# Patient Record
Sex: Male | Born: 1955
Health system: Southern US, Community
[De-identification: ages and names within clinical notes are randomized; demographics above are authoritative.]

## PROBLEM LIST (undated history)

## (undated) DIAGNOSIS — I10 Essential (primary) hypertension: Secondary | ICD-10-CM

## (undated) DIAGNOSIS — B029 Zoster without complications: Secondary | ICD-10-CM

## (undated) DIAGNOSIS — E559 Vitamin D deficiency, unspecified: Secondary | ICD-10-CM

## (undated) DIAGNOSIS — R011 Cardiac murmur, unspecified: Secondary | ICD-10-CM

## (undated) DIAGNOSIS — M1990 Unspecified osteoarthritis, unspecified site: Secondary | ICD-10-CM

## (undated) DIAGNOSIS — E785 Hyperlipidemia, unspecified: Secondary | ICD-10-CM

## (undated) HISTORY — DX: Essential (primary) hypertension: I10

## (undated) HISTORY — DX: Hyperlipidemia, unspecified: E78.5

## (undated) HISTORY — DX: Vitamin D deficiency, unspecified: E55.9

## (undated) HISTORY — PX: JOINT REPLACEMENT: SHX530

## (undated) HISTORY — DX: Zoster without complications: B02.9

## (undated) HISTORY — DX: Unspecified osteoarthritis, unspecified site: M19.90

## (undated) HISTORY — PX: ARTHROSCOPY WITH ANTERIOR CRUCIATE LIGAMENT (ACL) REPAIR WITH ANTERIOR TIBILIAS GRAFT: SHX6503

## (undated) HISTORY — PX: HERNIA REPAIR: SHX51

## (undated) HISTORY — PX: SHOULDER ARTHROSCOPY: SHX128

---

## 2006-06-09 ENCOUNTER — Ambulatory Visit: Payer: Self-pay | Admitting: Cardiology

## 2006-07-08 ENCOUNTER — Ambulatory Visit: Payer: Self-pay

## 2007-04-20 ENCOUNTER — Encounter: Admission: RE | Admit: 2007-04-20 | Discharge: 2007-04-20 | Payer: Self-pay | Admitting: Orthopedic Surgery

## 2011-02-21 ENCOUNTER — Other Ambulatory Visit: Payer: Self-pay | Admitting: Internal Medicine

## 2011-02-21 ENCOUNTER — Ambulatory Visit
Admission: RE | Admit: 2011-02-21 | Discharge: 2011-02-21 | Disposition: A | Discharge status: Home / Self Care | Payer: Self-pay | Source: Ambulatory Visit | Attending: Internal Medicine | Admitting: Internal Medicine

## 2011-02-21 MED ORDER — IOHEXOL 300 MG/ML  SOLN
100.0000 mL | Freq: Once | INTRAMUSCULAR | Status: AC | PRN
  Administered 2011-02-21: 100 mL via INTRAVENOUS

## 2011-03-01 NOTE — Assessment & Plan Note (Signed)
[facility]                              CARDIOLOGY OFFICE NOTE   NAME:Cheyney, HATIM HOMANN                   MRN:          [id number]  DATE:06/09/2006                            DOB:          09-22-1956    REASON FOR REFERRAL:  Dizziness and cardiovascular risk factors.   HISTORY OF PRESENT ILLNESS:  The patient is a very pleasant 55 year old  gentleman who has no past history of heart disease.  However, he has a  strong family history.  He was also complaining of dizziness.  This was  happening a couple of weeks ago.  He noticed that rather acutely it would  happen when he stood up.  He was not having any palpitations and denied any  presyncope or syncope.  He felt like things were spinning.  He was not  describing classic orthostatic symptoms.  By the time he saw Dr. Elisabeth Most  for routine followup, he was improving.  He now says he has had almost  complete resolution of this.   He has recently tried to lose weight and has been successful to the tune of  17 pounds.  He has been exercising three times a week.  With this, he denies  any chest discomfort, neck discomfort, arm discomfort, activity-induced  nausea, vomiting, excessive diaphoresis.   PAST MEDICAL HISTORY:  Hypertension x2 years, dyslipidemia (total 215,  triglycerides 143, HDL 46, LDL 140.  This is down from 177.)   PAST SURGICAL HISTORY:  None.   ALLERGIES:  NONE.   MEDICATIONS:  Diovan 80 mg daily.   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  He  has three children and two grandsons.  He has never smoked cigarettes and  does not drink alcohol.   FAMILY HISTORY:  Contributory for his father having heart disease in his  early 19s and dying of heart failure at age 8.   REVIEW OF SYSTEMS:  As stated in the HPI and otherwise negative for all  other systems.   PHYSICAL EXAMINATION:  GENERAL:  The patient is in no distress.  VITAL SIGNS:  Blood  pressure 113/74 without orthostatic blood pressure drop,  heart rate 84 and regular.  HEENT:  Eyes unremarkable, pupils equal, round and react to light, fundi  within normal limits, oral mucosa unremarkable.  NECK:  No jugular venous distention.  Waveform within normal limits.  Carotid upstroke brisk and symmetric, no bruits, no thyromegaly.  LYMPHATICS:  No cervical, axillary, inguinal adenopathy.  LUNGS:  Clear to auscultation bilaterally.  BACK:  No costovertebral angle tenderness.  CHEST:  Unremarkable.  HEART:  PMI not displaced or sustained, S1 and S2 within normal limits, no  S3, no S4, no murmurs.  ABDOMEN:  Flat, positive bowel sounds, normal in frequency and pitch, no  bruits, rebound, guarding, no midline,  pulsatile masses, no hepatomegaly,  splenomegaly.  SKIN:  No rashes, no nodules.  EXTREMITIES:  2+ pulses throughout, no edema, no cyanosis, no clubbing.  NEURO:  Oriented to person, place and time.  Cranial nerves II-XII grossly  intact, motor grossly intact.  EKG:  Sinus rhythm, rate 67, axis within normal limits, intervals within  normal limits, early repolarization pattern, rSR prime V1 and V2, no acute  ST-T wave changes.   ASSESSMENT/PLAN:  1. Dizziness.  Patient's dizziness is resolving.  He has no orthostatic      symptoms.  There is no significant history with dysrhythmia.  No      further cardiovascular testing is suggested.  2. Dyslipidemia.  I had a long conversation with the patient.  He has a      very strong family history.  He has done a great job getting his LDL      down to 140.  His starting was 177.  Given this previous lipid profile      and his strong family history, I have given him the option of starting      a statin.  I have told him that this is out of the current guidelines      and an aggressive approach.  He understands the side effects associated      with statins.  He wishes to proceed with a statin, and I have given him       simvastatin 20 mg daily.  He has gotten written instructions to get a      lipid profile and liver enzymes done either here or at Dr. Hardie Pulley      office in six weeks.  3. Followup.  Will bring him back for an exercise treadmill test.  He      wants to embark on a more rigorous exercise regimen.  He does have risk      factors.  I am going to screen him for obstructive coronary disease,      but more importantly, at the time of that test, he can get a      prescription for exercise as well as risk stratification.  4. Followup will be at the time of his exercise treadmill test.                               Rollene Rotunda, MD, [facility]    JH/MedQ  DD:  06/09/2006  DT:  06/10/2006  Job #:  [id number]

## 2011-03-01 NOTE — Procedures (Signed)
[name]                                EXERCISE TREADMILL   NAME:[name] [name] NACLERIO                   MRN:          [id number]  DATE:07/08/2006                            DOB:          1956/06/15    INDICATIONS:  The patient with multiple cardiovascular risk factors and  chest pain.   PROCEDURE NOTE:  The patient was exercised using standard Bruce protocol.  He was able to exercise x10 minutes.  This was 1 minute in stage IV.  He  achieved 11.7 METS.  He had a peak heart rate of 176 which is 104% of  predicted.  He had an appropriate blood pressure response with a maximum of  168/66.  He had no chest pain.  There were no ischemia ST and T wave  changes.  There was no ectopy.  He had a normal heart rate recovery.   CONCLUSION:  Negative adequate exercise treadmill test.  He has a very  reasonable exercise tolerance.  All parameters put him in a low risk  category for future cardiovascular events.   PLAN:  Based on this, the patient will pursue continued risk reduction.  I  gave him a prescription for exercise after a lengthy discussion.  He can  follow as needed.   Of note, I did start him on simvastatin at the initial appointment because  of his significant family history.  He says he did not feel well initially  taking this and stopped it himself.  I asked him to restart it to see if he  gets the same kind of symptoms as they were somewhat atypical.  If he does  not tolerate the simvastatin, I have given prescription for Pravachol 20 mg  p.o. nightly.  After he has been on a stable dose for Statin x6 weeks, he  has been given written instructions to get a lipid profile and liver  enzymes.  He can have this followed by Dr. Elisabeth [name].            ______________________________  [name] Rotunda, MD, [facility]      JH/MedQ  DD:  07/09/2006  DT:  07/10/2006  Job #:  [id number]   cc:   [name] [name], D.O.

## 2011-06-08 IMAGING — CT CT ABDOMEN W/ CM
2 of 5 series · 17 of 46 positions shown, 19 images · IV contrast (30CC OMNI 300 & [ID] OMNI 300)
Comparison: None.

CLINICAL DATA: Left upper quadrant abdominal pain for 2 weeks

CT ABDOMEN WITH CONTRAST
TECHNIQUE: Multidetector CT imaging of the abdomen was performed
using the standard protocol following bolus administration of
intravenous contrast.
Contrast: 100 ml Omnipaque-O66

[Series 2: abdomen w/ · axial · 0.70mm/px · z∈[-272,+8]mm · 14 of 64 slices shown, 16 images]
[im 4/64  soft-tissue]
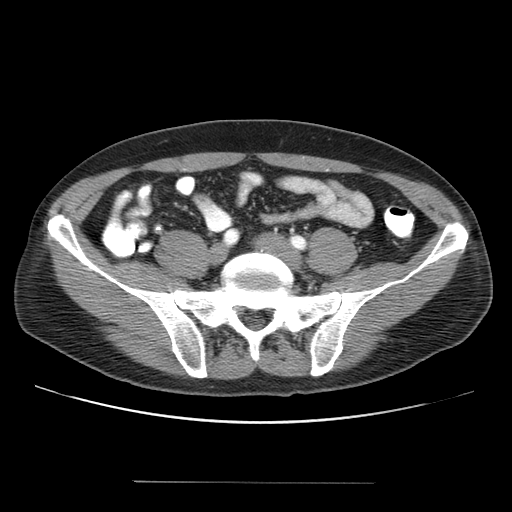
[im 4/64  bone]
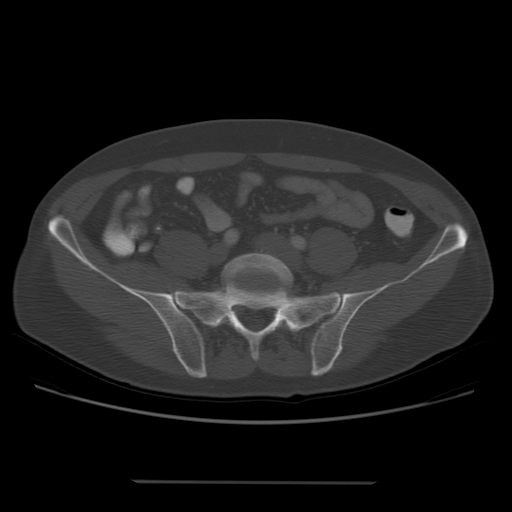
[im 8/64  soft-tissue]
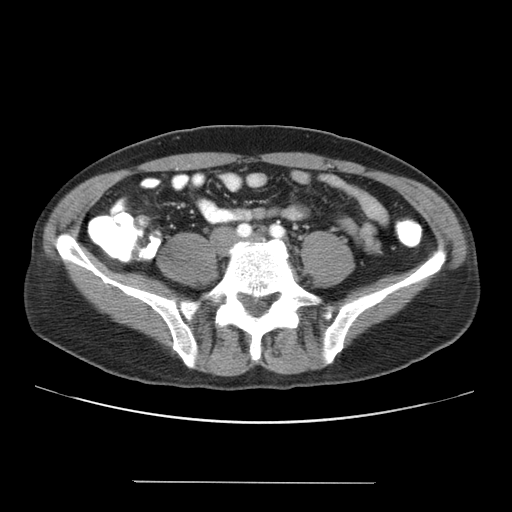
[im 12/64  soft-tissue]
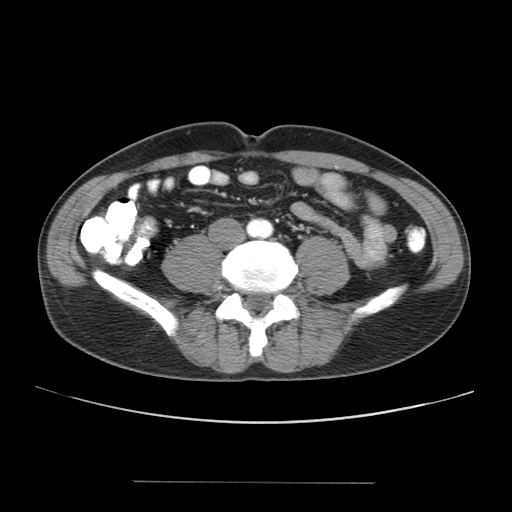
[im 16/64  soft-tissue]
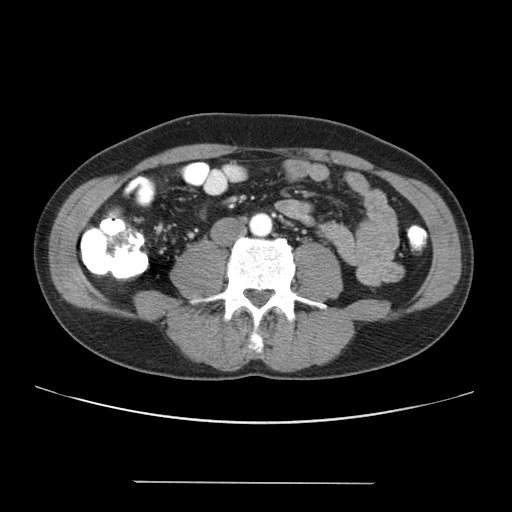
[im 20/64  soft-tissue]
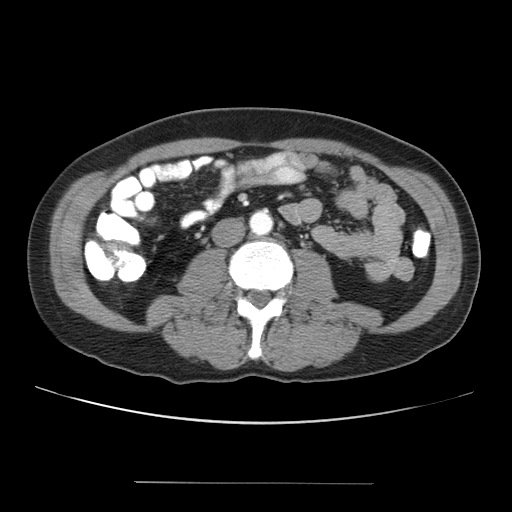
[im 24/64  soft-tissue]
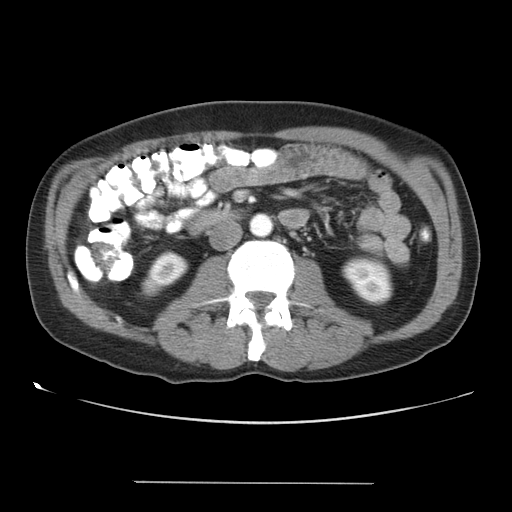
[im 28/64  soft-tissue]
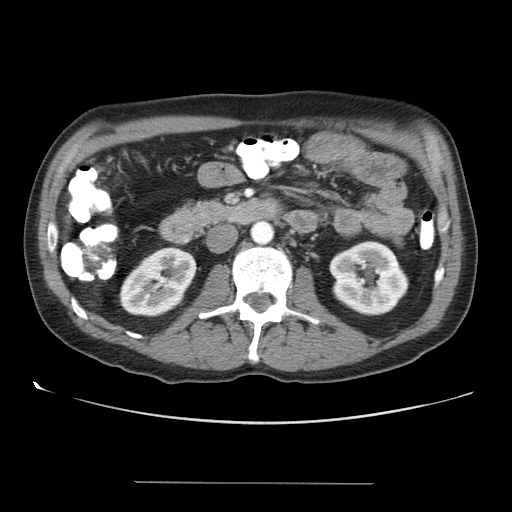
[im 36/64  soft-tissue]
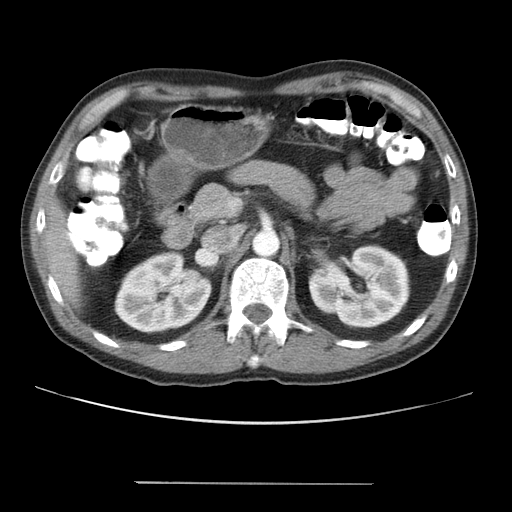
[im 40/64  soft-tissue]
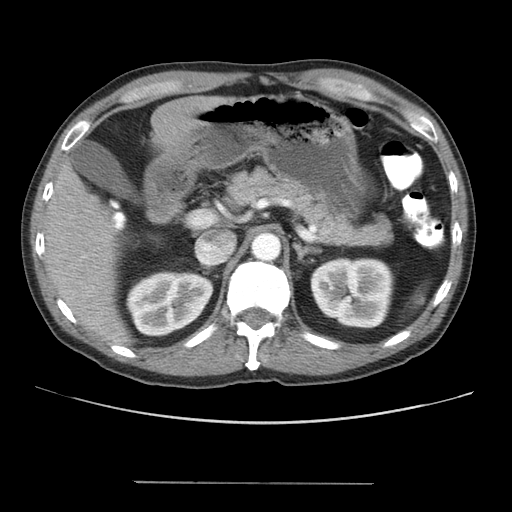
[im 40/64  bone]
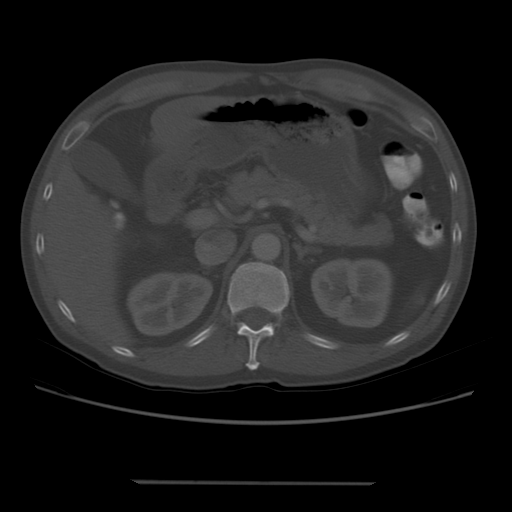
[im 44/64  soft-tissue]
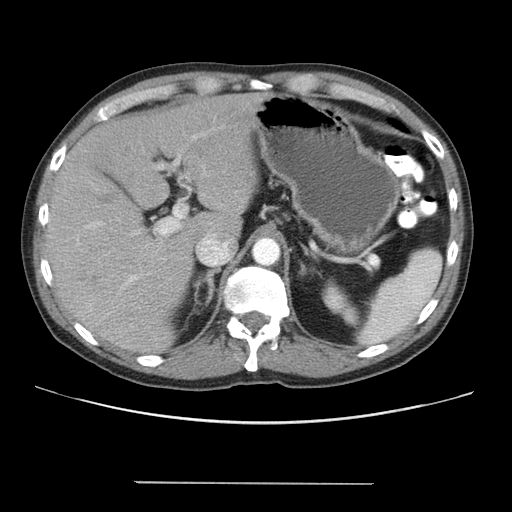
[im 48/64  soft-tissue]
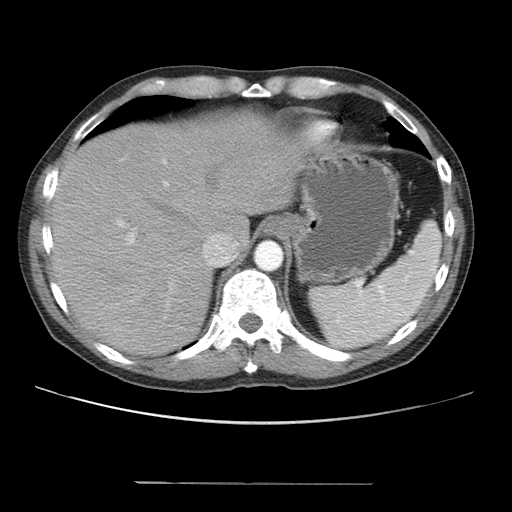
[im 52/64  soft-tissue]
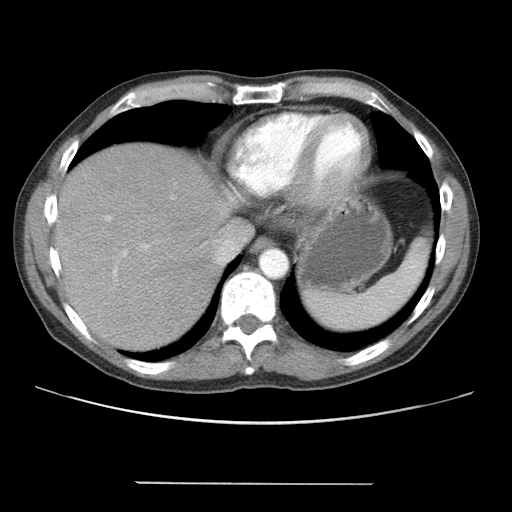
[im 56/64  soft-tissue]
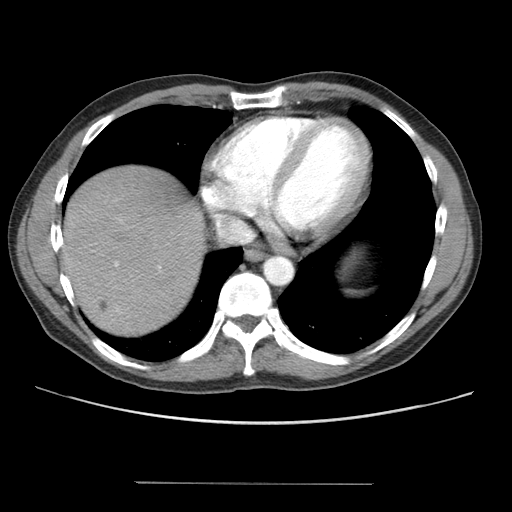
[im 60/64  soft-tissue]
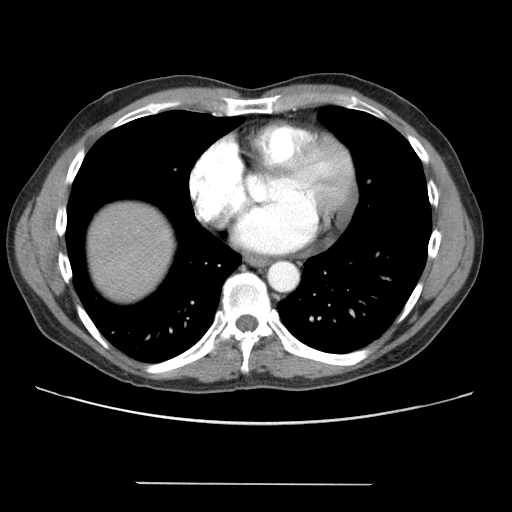

[Series 400: cor · coronal · 0.70mm/px · 3 of 97 slices shown]
[im 33/97  soft-tissue]
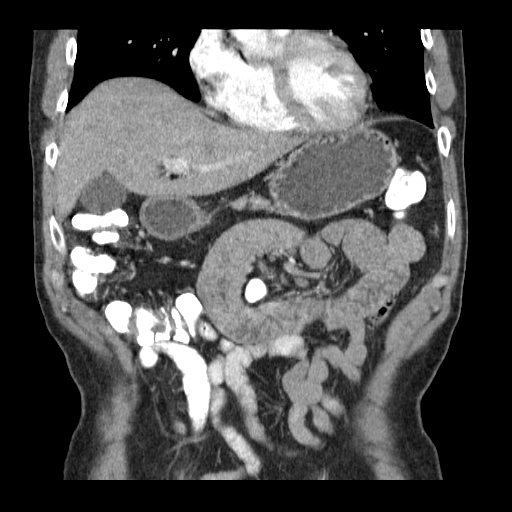
[im 43/97  soft-tissue]
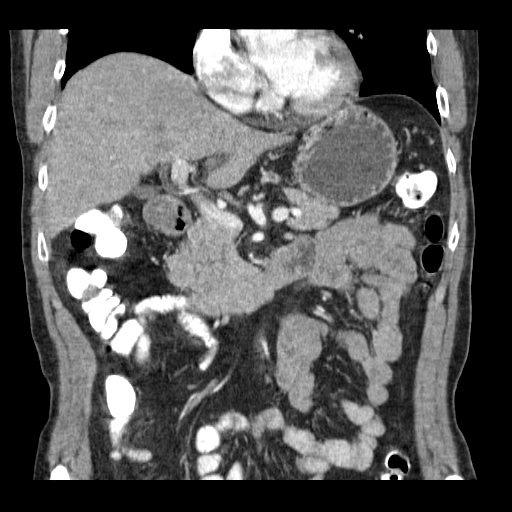
[im 54/97  soft-tissue]
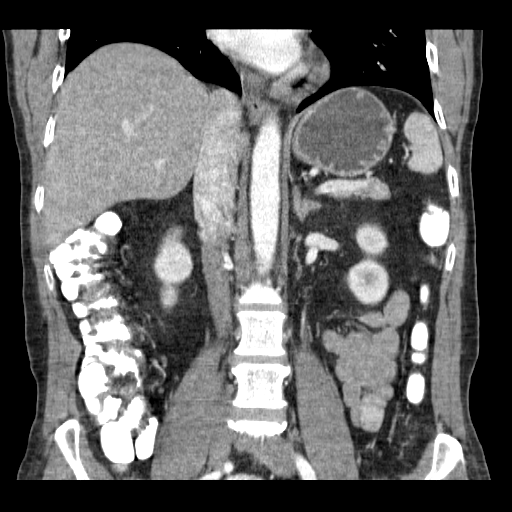

[17 of 46 positions shown; findings below may reference images not displayed]

FINDINGS: The lung bases are clear.  The heart is mildly enlarged.
Only a single low attenuation structures noted in the dome of the
right lobe of liver posteriorly most consistent with benign process
such as cyst.  No ductal dilatation is seen.  No calcified
gallstones are noted.  The pancreas is normal in size and the
pancreatic duct is not dilated.  The adrenal glands and spleen are
unremarkable.  The stomach is moderately fluid distended with no
abnormality noted.  The kidneys enhance with no calculus or mass
and no hydronephrosis is seen.  The abdominal aorta is normal in
caliber. The portion of the colon that is visualized on this CT
abdomen is unremarkable.  The terminal ileum is normal, as is the
proximal portion of the appendix that is visualized.  No bony
abnormality is seen.
IMPRESSION: Negative CT of the abdomen.  No explanation for the patient's left
upper quadrant pain is seen.

## 2013-12-06 ENCOUNTER — Other Ambulatory Visit: Payer: Self-pay | Admitting: Physician Assistant

## 2013-12-06 MED ORDER — SIMVASTATIN 40 MG PO TABS: 40.0000 mg | ORAL_TABLET | Freq: Every evening | ORAL | Status: DC

## 2013-12-06 MED ORDER — VALSARTAN 80 MG PO TABS: 80.0000 mg | ORAL_TABLET | Freq: Every day | ORAL | Status: DC

## 2013-12-13 NOTE — Progress Notes (Signed)
LMOM TO CALL & SCHEDULE  

## 2014-01-20 ENCOUNTER — Other Ambulatory Visit: Payer: Self-pay | Admitting: Emergency Medicine

## 2014-01-21 ENCOUNTER — Encounter: Payer: Self-pay | Admitting: Emergency Medicine

## 2014-01-21 ENCOUNTER — Ambulatory Visit (INDEPENDENT_AMBULATORY_CARE_PROVIDER_SITE_OTHER): Payer: BC Managed Care – PPO | Admitting: Emergency Medicine

## 2014-01-21 VITALS — BP 122/84 | HR 78 | Temp 98.2°F | Resp 16 | Ht 68.5 in | Wt 168.0 lb

## 2014-01-21 DIAGNOSIS — R079 Chest pain, unspecified: Secondary | ICD-10-CM

## 2014-01-21 DIAGNOSIS — E785 Hyperlipidemia, unspecified: Secondary | ICD-10-CM

## 2014-01-21 DIAGNOSIS — I10 Essential (primary) hypertension: Secondary | ICD-10-CM

## 2014-01-21 DIAGNOSIS — E782 Mixed hyperlipidemia: Secondary | ICD-10-CM | POA: Insufficient documentation

## 2014-01-21 DIAGNOSIS — R7309 Other abnormal glucose: Secondary | ICD-10-CM

## 2014-01-21 LAB — CBC WITH DIFFERENTIAL/PLATELET
BASOS ABS: 0.1 10*3/uL (ref 0.0–0.1)
BASOS PCT: 1 % (ref 0–1)
EOS ABS: 0.1 10*3/uL (ref 0.0–0.7)
Eosinophils Relative: 2 % (ref 0–5)
HCT: 37.5 % — ABNORMAL LOW (ref 39.0–52.0)
HEMOGLOBIN: 12.8 g/dL — AB (ref 13.0–17.0)
Lymphocytes Relative: 27 % (ref 12–46)
Lymphs Abs: 1.7 10*3/uL (ref 0.7–4.0)
MCH: 29.4 pg (ref 26.0–34.0)
MCHC: 34.1 g/dL (ref 30.0–36.0)
MCV: 86.2 fL (ref 78.0–100.0)
MONOS PCT: 9 % (ref 3–12)
Monocytes Absolute: 0.6 10*3/uL (ref 0.1–1.0)
NEUTROS ABS: 3.8 10*3/uL (ref 1.7–7.7)
NEUTROS PCT: 61 % (ref 43–77)
PLATELETS: 247 10*3/uL (ref 150–400)
RBC: 4.35 MIL/uL (ref 4.22–5.81)
RDW: 14.3 % (ref 11.5–15.5)
WBC: 6.2 10*3/uL (ref 4.0–10.5)

## 2014-01-21 LAB — HEPATIC FUNCTION PANEL
ALK PHOS: 52 U/L (ref 39–117)
ALT: 14 U/L (ref 0–53)
AST: 14 U/L (ref 0–37)
Albumin: 4.1 g/dL (ref 3.5–5.2)
BILIRUBIN DIRECT: 0.1 mg/dL (ref 0.0–0.3)
BILIRUBIN INDIRECT: 0.4 mg/dL (ref 0.2–1.2)
TOTAL PROTEIN: 6.5 g/dL (ref 6.0–8.3)
Total Bilirubin: 0.5 mg/dL (ref 0.2–1.2)

## 2014-01-21 LAB — LIPID PANEL
Cholesterol: 160 mg/dL (ref 0–200)
HDL: 45 mg/dL (ref >39)
LDL CALC: 98 mg/dL (ref 0–99)
Total CHOL/HDL Ratio: 3.6 Ratio
Triglycerides: 86 mg/dL (ref <150)
VLDL: 17 mg/dL (ref 0–40)

## 2014-01-21 LAB — BASIC METABOLIC PANEL WITH GFR
BUN: 15 mg/dL (ref 6–23)
CALCIUM: 9.2 mg/dL (ref 8.4–10.5)
CHLORIDE: 102 meq/L (ref 96–112)
CO2: 28 meq/L (ref 19–32)
Creat: 0.83 mg/dL (ref 0.50–1.35)
GFR, Est African American: >89 mL/min
Glucose, Bld: 95 mg/dL (ref 70–99)
Potassium: 4.4 mEq/L (ref 3.5–5.3)
SODIUM: 139 meq/L (ref 135–145)

## 2014-01-21 LAB — HEMOGLOBIN A1C
HEMOGLOBIN A1C: 6 % — AB (ref <5.7)
MEAN PLASMA GLUCOSE: 126 mg/dL — AB (ref <117)

## 2014-01-21 MED ORDER — VALSARTAN 80 MG PO TABS: ORAL_TABLET | ORAL | Status: DC

## 2014-01-21 MED ORDER — SIMVASTATIN 40 MG PO TABS: ORAL_TABLET | ORAL | Status: DC

## 2014-01-21 NOTE — Progress Notes (Signed)
   Subjective:    Patient ID: [name] [name], male    DOB: 09/28/1956, 58 y.o.   MRN: [id number]  HPI Comments: 58 yo WM presents for 3 month F/U for HTN, Cholesterol, Pre-Dm, D. Deficient. He did not f/u AD. He has been eating healthy for the most part. He is keeping active. He is not checking BP. He has been off RX x 2 days due to being overdue for labs. He had mild chest discomfort for a couple days last week. Father had 1st MI at 1550 and died at 3161. He had echo 2007 negative per Dr Antoine PocheHochrein.  LAST LABS  BS 105 T 151 TG 60 H 44 L 95 A1C 5.8 D 61  Hyperlipidemia Associated symptoms include chest pain.  Hypertension Associated symptoms include chest pain.      Medication List       This list is accurate as of: 01/21/14  8:59 AM.  Always use your most recent med list.               simvastatin 40 MG tablet  Commonly known as:  ZOCOR  TAKE 1 TABLET EVERY DAY     valsartan 80 MG tablet  Commonly known as:  DIOVAN  TAKE 1 TABLET EVERY DAY       No Known Allergies  Past Medical History  Diagnosis Date  . Hyperlipidemia   . Hypertension      Review of Systems  Respiratory: Positive for chest tightness.   Cardiovascular: Positive for chest pain.  All other systems reviewed and are negative.  BP 122/84  Pulse 78  Temp(Src) 98.2 F (36.8 C) (Temporal)  Resp 16  Ht 5' 8.5" (1.74 m)  Wt 168 lb (76.204 kg)  BMI 25.17 kg/m2      Objective:   Physical Exam  Nursing note and vitals reviewed. Constitutional: He is oriented to person, place, and time. He appears well-developed and well-nourished.  HENT:  Head: Normocephalic and atraumatic.  Right Ear: External ear normal.  Left Ear: External ear normal.  Nose: Nose normal.  Eyes: Conjunctivae and EOM are normal.  Neck: Normal range of motion. Neck supple. No JVD present. No thyromegaly present.  Cardiovascular: Normal rate, regular rhythm, normal heart sounds and intact distal pulses.   Pulmonary/Chest: Effort  normal and breath sounds normal.  Abdominal: Soft. Bowel sounds are normal. He exhibits no distension and no mass. There is no tenderness. There is no rebound and no guarding.  Musculoskeletal: Normal range of motion. He exhibits no edema and no tenderness.  Lymphadenopathy:    He has no cervical adenopathy.  Neurological: He is alert and oriented to person, place, and time. He has normal reflexes. No cranial nerve deficit. Coordination normal.  Skin: Skin is warm and dry.  Psychiatric: He has a normal mood and affect. His behavior is normal. Judgment and thought content normal.      EKG NSCSPT WNL     Assessment & Plan:  1.  3 month F/U for HTN, Cholesterol, Pre-Dm, D. Deficient. Needs healthy diet, cardio QD and obtain healthy weight. Check Labs, Check BP if >130/80 call office  2. Chest discomfort with FHx heart disease- Add ASA 81 mg. Ref Dr Antoine PocheHochrein for evaluation. ER if symptoms reoccur

## 2014-01-21 NOTE — Patient Instructions (Addendum)
Cholesterol Cholesterol is a type of fat. Your body needs a small amount of cholesterol, but too much can cause health problems. Certain problems include heart attacks, strokes, and not enough blood flow to your heart, brain, kidneys, or feet. You get cholesterol in 2 ways:  Naturally.  By eating certain foods. HOME CARE  Eat a low-fat diet:  Eat less eggs, whole dairy products (whole milk, cheese, and butter), fatty meats, and fried foods.  Eat more fruits, vegetables, whole-wheat breads, lean chicken, and fish.  Follow your exercise program as told by your doctor.  Keep your weight at a healthy level. Talk to your doctor about what is right for you.  Only take medicine as told by your doctor.  Get your cholesterol checked once a year or as told by your doctor. MAKE SURE YOU:  Understand these instructions.  Will watch your condition.  Will get help right away if you are not doing well or get worse. Document Released: 12/27/2008 Document Revised: 12/23/2011 Document Reviewed: 07/14/2013 [facility] Patient Information 2014 [place], [place]. Hypertension GOOD BP is less than 130/85  Hypertension is another name for high blood pressure. High blood pressure may mean that your heart needs to work harder to pump blood. Blood pressure consists of two numbers, which includes a higher number over a lower number (example: 110/72). HOME CARE   Make lifestyle changes as told by your doctor. This may include weight loss and exercise.  Take your blood pressure medicine every day.  Limit how much salt you use.  Stop smoking if you smoke.  Do not use drugs.  Talk to your doctor if you are using decongestants or birth control pills. These medicines might make blood pressure higher.  Females should not drink more than 1 alcoholic drink per day. Males should not drink more than 2 alcoholic drinks per day.  See your doctor as told. GET HELP RIGHT AWAY IF:   You have a blood pressure  reading with a top number of 180 or higher.  You get a very bad headache.  You get blurred or changing vision.  You feel confused.  You feel weak, numb, or faint.  You get chest or belly (abdominal) pain.  You throw up (vomit).  You cannot breathe very well. MAKE SURE YOU:   Understand these instructions.  Will watch your condition.  Will get help right away if you are not doing well or get worse. Document Released: 03/18/2008 Document Revised: 12/23/2011 Document Reviewed: 03/18/2008 [facility] Patient Information 2014 [place], [place].   Bad carbs also include fruit juice, alcohol, and sweet tea. These are empty calories that do not signal to your brain that you are full.   Please remember the good carbs are still carbs which convert into sugar. So please measure them out no more than 1/2-1 cup of rice, oatmeal, pasta, and beans.  Veggies are however free foods! Pile them on.   I like lean protein at every meal such as chicken, [place], pork chops, cottage cheese, etc. Just do not fry these meats and please center your meal around vegetable, the meats should be a side dish.   No all fruit is created equal. Please see the list below, the fruit at the bottom is higher in sugars than the fruit at the top   We want weight loss that will last so you should lose 1-2 pounds a week.  THAT IS IT! Please pick THREE things a month to change. Once it is a habit check  off the item. Then pick another three items off the list to become habits.  If you are already doing a habit on the list GREAT!  Cross that item off! o Don't drink your calories. Ie, alcohol, soda, fruit juice, and sweet tea.  o Drink more water. Drink a glass when you feel hungry or before each meal.  o Eat breakfast - Complex carb and protein (likeDannon light and fit yogurt, oatmeal, fruit, eggs, [place]key bacon). o Measure your cereal.  Eat no more than one cup a day. (ie [place]ashi) o Eat an apple a day. o Add a vegetable a  day. o Try a new vegetable a month. o Use Pam! Stop using oil or butter to cook. o Don't finish your plate or use smaller plates. o Share your dessert. o Eat sugar free Jello for dessert or frozen grapes. o Don't eat 2-3 hours before bed. o Switch to whole wheat bread, pasta, and brown rice. o Make healthier choices when you eat out. No fries! o Pick baked chicken, NOT fried. o Don't forget to SLOW DOWN when you eat. It is not going anywhere.  o Take the stairs. o Park far away in the parking lot o [employer] Farm[employer] soup cans (or weights) for 10 minutes while watching TV. o Walk at work for 10 minutes during break. o Walk outside 1 time a week with your friend, kids, dog, or significant other. o Start a walking group at church. o Walk the mall as much as you can tolerate.  o Keep a food diary. o Weigh yourself daily. o Walk for 15 minutes 3 days per week. o Cook at home more often and eat out less.  If life happens and you go back to old habits, it is okay.  Just start over. You can do it!   If you experience chest pain, get short of breath, or tired during the exercise, please stop immediately and inform your doctor.  Chest Pain (Nonspecific) Chest pain has many causes. Your pain could be caused by something serious, such as a heart attack or a blood clot in the lungs. It could also be caused by something less serious, such as a chest bruise or a virus. Follow up with your doctor. More lab tests or other studies may be needed to find the cause of your pain. Most of the time, nonspecific chest pain will improve within 2 to 3 days of rest and mild pain medicine. HOME CARE  For chest bruises, you may put ice on the sore area for 15-20 minutes, 03-04 times a day. Do this only if it makes you feel better.  Put ice in a plastic bag.  Place a towel between the skin and the bag.  Rest for the next 2 to 3 days.  Go back to work if the pain improves.  See your doctor if the pain lasts longer than 1  to 2 weeks.  Only take medicine as told by your doctor.  Quit smoking if you smoke. GET HELP RIGHT AWAY IF:   There is more pain or pain that spreads to the arm, neck, jaw, back, or belly (abdomen).  You have shortness of breath.  You cough more than usual or cough up blood.  You have very bad back or belly pain, feel sick to your stomach (nauseous), or throw up (vomit).  You have very bad weakness.  You pass out (faint).  You have a fever. Any of these problems may be serious and may  be an emergency. Do not wait to see if the problems will go away. Get medical help right away. Call your local emergency services 911 in [place] Do not drive yourself to the hospital. MAKE SURE YOU:   Understand these instructions.  Will watch this condition.  Will get help right away if you or your child is not doing well or gets worse. Document Released: 03/18/2008 Document Revised: 12/23/2011 Document Reviewed: 03/18/2008 [facility] Patient Information 2014 [place], [place].

## 2014-01-25 NOTE — Addendum Note (Signed)
Addended by: Loree FeeSMITH, Andrian Urbach R on: 01/25/2014 11:21 AM   Modules accepted: Orders

## 2014-02-18 ENCOUNTER — Encounter: Payer: Self-pay | Admitting: Cardiology

## 2014-02-18 ENCOUNTER — Ambulatory Visit (INDEPENDENT_AMBULATORY_CARE_PROVIDER_SITE_OTHER): Payer: BC Managed Care – PPO | Admitting: Cardiology

## 2014-02-18 VITALS — BP 120/70 | HR 87 | Ht 68.5 in | Wt 163.0 lb

## 2014-02-18 DIAGNOSIS — Z8249 Family history of ischemic heart disease and other diseases of the circulatory system: Secondary | ICD-10-CM

## 2014-02-18 DIAGNOSIS — E7800 Pure hypercholesterolemia, unspecified: Secondary | ICD-10-CM

## 2014-02-18 DIAGNOSIS — I10 Essential (primary) hypertension: Secondary | ICD-10-CM | POA: Insufficient documentation

## 2014-02-18 DIAGNOSIS — R011 Cardiac murmur, unspecified: Secondary | ICD-10-CM

## 2014-02-18 DIAGNOSIS — R079 Chest pain, unspecified: Secondary | ICD-10-CM

## 2014-02-18 NOTE — Progress Notes (Signed)
      [address] [place], [place]  [address] Phone: [phone] Fax:  [phone]  Date:  02/18/2014   ID:  [name] RossettiDonald W [name], DOB 10/06/1956, MRN [id number]  PCP:  Barnet GlasgowSmith, Melissa M, MD   History of Present Illness: [name] [name] is a 58 y.o. male here for the evaluation of chest discomfort. Has a strong family history with his father having first myocardial infarction at age 60 and dying at age 60. In the past, echocardiogram and stress test in 2007 were performed previously.  His LDL is 95, hemoglobin A1c 5.8.  Tightness off and on, center of chest, no radiation, doesn't feel quite right. Non exertional. No change with food. No stroke, no bleeding, no orthopnea.    Wt Readings from Last 3 Encounters:  02/18/14 163 lb (73.936 kg)  01/21/14 168 lb (76.204 kg)     Past Medical History  Diagnosis Date  . Hyperlipidemia   . Hypertension     No past surgical history on file.  Current Outpatient Prescriptions  Medication Sig Dispense Refill  . simvastatin (ZOCOR) 40 MG tablet TAKE 1 TABLET EVERY DAY  90 tablet  0  . valsartan (DIOVAN) 80 MG tablet TAKE 1 TABLET EVERY DAY  90 tablet  0   No current facility-administered medications for this visit.    Allergies:   No Known Allergies  Social History:  The patient  reports that he has never smoked. He does not have any smokeless tobacco history on file. Work for [employer], travel  No family history on file.  ROS:  Please see the history of present illness.    All other systems reviewed and negative.   PHYSICAL EXAM: VS:  BP 120/70  Pulse 87  Ht 5' 8.5" (1.74 m)  Wt 163 lb (73.936 kg)  BMI 24.42 kg/m2 Well nourished, well developed, in no acute distress HEENT: normal, Potter/AT, EOMI Neck: no JVD, normal carotid upstroke, + bruit (radiation of murmur likely) Cardiac:  normal S1, S2; RRR; 2/6 S murmur RUSB Lungs:  clear to auscultation bilaterally, no wheezing, rhonchi or rales Abd: soft,  nontender, no hepatomegaly, no bruits Ext: no edema, 2+ distal pulses Skin: warm and dry GU: deferred Neuro: no focal abnormalities noted, AAO x 3  EKG:  01/21/14-sinus rhythm with subtle J-point elevation diffusely.  ASSESSMENT AND PLAN:  1. Atypical chest pain-does not appear to be exertional. We will go ahead and check an exercise treadmill test. Subtle J-point elevation noted on EKG. Likely normal variant. 2. Heart murmur-checking echocardiogram. 3. Hyperlipidemia-simvastatin. 4. Family history of CAD-father MI 850. Primary prevention. Discussed. Exercise. 5. Will follow up with tests, PRN otherwise.   Signed, Donato SchultzMark Luciel Brickman, MD [facility]  02/18/2014 1:35 PM

## 2014-02-18 NOTE — Patient Instructions (Addendum)
Your physician recommends that you continue on your current medications as directed. Please refer to the Current Medication list given to you today.  Your physician has requested that you have an exercise tolerance test. For further information please visit [email or web address]. Please also follow instruction sheet, as given.  Your physician has requested that you have an echocardiogram. Echocardiography is a painless test that uses sound waves to create images of your heart. It provides your doctor with information about the size and shape of your heart and how well your heart's chambers and valves are working. This procedure takes approximately one hour. There are no restrictions for this procedure.  Your physician recommends that you schedule a follow-up appointment in: as needed with Dr. Anne FuSkains

## 2014-04-01 ENCOUNTER — Encounter: Payer: BC Managed Care – PPO | Admitting: Physician Assistant

## 2014-04-01 ENCOUNTER — Other Ambulatory Visit (HOSPITAL_COMMUNITY): Payer: BC Managed Care – PPO

## 2014-04-14 ENCOUNTER — Ambulatory Visit (INDEPENDENT_AMBULATORY_CARE_PROVIDER_SITE_OTHER): Payer: BC Managed Care – PPO | Admitting: Emergency Medicine

## 2014-04-14 ENCOUNTER — Encounter: Payer: Self-pay | Admitting: Emergency Medicine

## 2014-04-14 VITALS — BP 124/68 | HR 88 | Temp 98.4°F | Resp 16 | Ht 68.5 in | Wt 167.0 lb

## 2014-04-14 DIAGNOSIS — Z125 Encounter for screening for malignant neoplasm of prostate: Secondary | ICD-10-CM

## 2014-04-14 DIAGNOSIS — I10 Essential (primary) hypertension: Secondary | ICD-10-CM

## 2014-04-14 DIAGNOSIS — Z0000 Encounter for general adult medical examination without abnormal findings: Secondary | ICD-10-CM

## 2014-04-14 NOTE — Progress Notes (Signed)
Subjective:    Patient ID: [name] [name], male    DOB: 08/28/1956, 58 y.o.   MRN: [id number]  HPI Comments: 58 yo WM CPE and F/U for HTN, Cholesterol, Pre-Dm, D. deficient He has appointment with Caridology later this month to evaluate CP. He notes he is overall doing well. He has not had any chest pain since last OV. He is staying active and trying to improve diet.  He notes BP is good at home. He has no other concerns.  WBC             6.2   01/21/2014 HGB            12.8   01/21/2014 HCT            37.5   01/21/2014 PLT             247   01/21/2014 GLUCOSE          95   01/21/2014 CHOL            160   01/21/2014 TRIG             86   01/21/2014 HDL              45   01/21/2014 LDLCALC          98   01/21/2014 ALT              14   01/21/2014 AST              14   01/21/2014 NA              139   01/21/2014 K               4.4   01/21/2014 CL              102   01/21/2014 CREATININE     0.83   01/21/2014 BUN              15   01/21/2014 CO2              28   01/21/2014 HGBA1C          6.0   01/21/2014   Hyperlipidemia  Hypertension     Medication List       This list is accurate as of: 04/14/14 11:59 PM.  Always use your most recent med list.               aspirin 325 MG tablet  Take 325 mg by mouth daily.     simvastatin 40 MG tablet  Commonly known as:  ZOCOR  TAKE 1 TABLET EVERY DAY     valsartan 80 MG tablet  Commonly known as:  DIOVAN  TAKE 1 TABLET EVERY DAY     Vitamin D3 5000 UNITS Tabs  Take 5,000 Units by mouth daily.      No Known Allergies Past Medical History  Diagnosis Date  . Hyperlipidemia   . Hypertension   . Shingles   . Vitamin D deficiency    Past Surgical History  Procedure Laterality Date  . Shoulder arthroscopy      History  Substance Use Topics  . Smoking status: Never Smoker   . Smokeless tobacco: Not on file  . Alcohol Use: No   Family History  Problem Relation Age of Onset  . Cancer Mother     colon, lung  . Heart disease  Father   .  Hyperlipidemia Father   . Heart attack Father     MAINTENANCE: Colonoscopy:12/2012 [id number]:2014 WNL Dentist: q 6 month  IMMUNIZATIONS: Tdap:2008 Pneumovax: Zostavax: Influenza: declines  Patient Care Team: Lucky CowboyWilliam McKeown, MD as PCP - General (Internal Medicine) Griffith CitronJeffrey R Medoff, MD as Consulting Physician (Gastroenterology) Antonietta Barcelonaharles David Miller, OD as Referring Physician (Optometry) Beatrice LecherScott T Weaver, PA-C as Physician Assistant (Physician Assistant) Nils FlackShelley Cathcart, MD as Referring Physician (Dermatology) [place], (Dentist)  Review of Systems  All other systems reviewed and are negative.  BP 124/68  Pulse 88  Temp(Src) 98.4 F (36.9 C) (Temporal)  Resp 16  Ht 5' 8.5" (1.74 m)  Wt 167 lb (75.751 kg)  BMI 25.02 kg/m2     Objective:   Physical Exam  Nursing note and vitals reviewed. Constitutional: He is oriented to person, place, and time. He appears well-developed and well-nourished.  HENT:  Head: Normocephalic and atraumatic.  Right Ear: External ear normal.  Left Ear: External ear normal.  Nose: Nose normal.  Eyes: Conjunctivae and EOM are normal. Pupils are equal, round, and reactive to light. Right eye exhibits no discharge. Left eye exhibits no discharge. No scleral icterus.  Neck: Normal range of motion. Neck supple. No JVD present. No tracheal deviation present. No thyromegaly present.  Cardiovascular: Normal rate, regular rhythm and intact distal pulses.   Murmur heard. 2/6 stable  Pulmonary/Chest: Effort normal and breath sounds normal.  Abdominal: Soft. Bowel sounds are normal. He exhibits no distension and no mass. There is no tenderness. There is no rebound and no guarding.  Genitourinary:  Patient declines  Musculoskeletal: Normal range of motion. He exhibits no edema and no tenderness.  Lymphadenopathy:    He has no cervical adenopathy.  Neurological: He is alert and oriented to person, place, and time. He has normal reflexes. No cranial  nerve deficit. He exhibits normal muscle tone. Coordination normal.  Skin: Skin is warm and dry. No rash noted. No erythema. No pallor.  Psychiatric: He has a normal mood and affect. His behavior is normal. Judgment and thought content normal.     Aorta SCAN WNL     Assessment & Plan:  1. CPE- Update screening labs/ History/ Immunizations/ Testing as needed. Advised healthy diet, QD exercise, increase H20 and continue RX/ Vitamins AD.  2. F/U for HTN, Cholesterol, Pre-Dm, D. Deficient. Needs healthy diet, cardio QD and obtain healthy weight. Check Labs, Check BP if >130/80 call office

## 2014-04-14 NOTE — Patient Instructions (Signed)
Cough, Adult ° A cough is a reflex. It helps you clear your throat and airways. A cough can help heal your body. A cough can last 2 or 3 weeks (acute) or may last more than 8 weeks (chronic). Some common causes of a cough can include an infection, allergy, or a cold. °HOME CARE °· Only take medicine as told by your doctor. °· If given, take your medicines (antibiotics) as told. Finish them even if you start to feel better. °· Use a cold steam vaporizer or humidier in your home. This can help loosen thick spit (secretions). °· Sleep so you are almost sitting up (semi-upright). Use pillows to do this. This helps reduce coughing. °· Rest as needed. °· Stop smoking if you smoke. °GET HELP RIGHT AWAY IF: °· You have yellowish-white fluid (pus) in your thick spit. °· Your cough gets worse. °· Your medicine does not reduce coughing, and you are losing sleep. °· You cough up blood. °· You have trouble breathing. °· Your pain gets worse and medicine does not help. °· You have a fever. °MAKE SURE YOU:  °· Understand these instructions. °· Will watch your condition. °· Will get help right away if you are not doing well or get worse. °Document Released: 06/13/2011 Document Revised: 12/23/2011 Document Reviewed: 06/13/2011 °Exit[employer]® Patient Information ©2015 [employer]. This information is not intended to replace advice given to you by your health care provider. Make sure you discuss any questions you have with your health care provider. ° °

## 2014-04-15 LAB — TSH: TSH: 2.617 u[IU]/mL (ref 0.350–4.500)

## 2014-04-15 LAB — CBC WITH DIFFERENTIAL/PLATELET
BASOS PCT: 1 % (ref 0–1)
Basophils Absolute: 0.1 10*3/uL (ref 0.0–0.1)
EOS ABS: 0.2 10*3/uL (ref 0.0–0.7)
Eosinophils Relative: 3 % (ref 0–5)
HCT: 37.8 % — ABNORMAL LOW (ref 39.0–52.0)
Hemoglobin: 12.8 g/dL — ABNORMAL LOW (ref 13.0–17.0)
Lymphocytes Relative: 26 % (ref 12–46)
Lymphs Abs: 1.7 10*3/uL (ref 0.7–4.0)
MCH: 29.4 pg (ref 26.0–34.0)
MCHC: 33.9 g/dL (ref 30.0–36.0)
MCV: 86.9 fL (ref 78.0–100.0)
MONOS PCT: 10 % (ref 3–12)
Monocytes Absolute: 0.7 10*3/uL (ref 0.1–1.0)
NEUTROS PCT: 60 % (ref 43–77)
Neutro Abs: 4 10*3/uL (ref 1.7–7.7)
PLATELETS: 255 10*3/uL (ref 150–400)
RBC: 4.35 MIL/uL (ref 4.22–5.81)
RDW: 13.8 % (ref 11.5–15.5)
WBC: 6.7 10*3/uL (ref 4.0–10.5)

## 2014-04-15 LAB — MICROALBUMIN / CREATININE URINE RATIO
CREATININE, URINE: 114.2 mg/dL
MICROALB UR: 1.16 mg/dL (ref 0.00–1.89)
MICROALB/CREAT RATIO: 10.2 mg/g (ref 0.0–30.0)

## 2014-04-15 LAB — URINALYSIS, ROUTINE W REFLEX MICROSCOPIC
BILIRUBIN URINE: NEGATIVE
GLUCOSE, UA: NEGATIVE mg/dL
Hgb urine dipstick: NEGATIVE
Ketones, ur: NEGATIVE mg/dL
Leukocytes, UA: NEGATIVE
Nitrite: NEGATIVE
PH: 6.5 (ref 5.0–8.0)
PROTEIN: NEGATIVE mg/dL
Specific Gravity, Urine: 1.017 (ref 1.005–1.030)
Urobilinogen, UA: 0.2 mg/dL (ref 0.0–1.0)

## 2014-04-15 LAB — VITAMIN D 25 HYDROXY (VIT D DEFICIENCY, FRACTURES): Vit D, 25-Hydroxy: 73 ng/mL (ref 30–89)

## 2014-04-15 LAB — BASIC METABOLIC PANEL WITH GFR
BUN: 16 mg/dL (ref 6–23)
CALCIUM: 9.2 mg/dL (ref 8.4–10.5)
CHLORIDE: 107 meq/L (ref 96–112)
CO2: 30 meq/L (ref 19–32)
Creat: 0.84 mg/dL (ref 0.50–1.35)
GFR, Est African American: >89 mL/min
GFR, Est Non African American: >89 mL/min
GLUCOSE: 91 mg/dL (ref 70–99)
POTASSIUM: 4.5 meq/L (ref 3.5–5.3)
SODIUM: 143 meq/L (ref 135–145)

## 2014-04-15 LAB — HEPATIC FUNCTION PANEL
ALBUMIN: 4.2 g/dL (ref 3.5–5.2)
ALT: 15 U/L (ref 0–53)
AST: 15 U/L (ref 0–37)
Alkaline Phosphatase: 57 U/L (ref 39–117)
BILIRUBIN DIRECT: 0.1 mg/dL (ref 0.0–0.3)
BILIRUBIN TOTAL: 0.3 mg/dL (ref 0.2–1.2)
Indirect Bilirubin: 0.2 mg/dL (ref 0.2–1.2)
Total Protein: 6.4 g/dL (ref 6.0–8.3)

## 2014-04-15 LAB — PSA: PSA: 1.77 ng/mL (ref <=4.00)

## 2014-04-15 LAB — INSULIN, FASTING: Insulin fasting, serum: 65 u[IU]/mL — ABNORMAL HIGH (ref 3–28)

## 2014-04-15 LAB — MAGNESIUM: Magnesium: 1.9 mg/dL (ref 1.5–2.5)

## 2014-04-15 LAB — TESTOSTERONE: TESTOSTERONE: 253 ng/dL — AB (ref 300–890)

## 2014-05-11 ENCOUNTER — Ambulatory Visit (HOSPITAL_COMMUNITY): Payer: BC Managed Care – PPO | Attending: Cardiology | Admitting: Radiology

## 2014-05-11 ENCOUNTER — Ambulatory Visit (INDEPENDENT_AMBULATORY_CARE_PROVIDER_SITE_OTHER): Payer: BC Managed Care – PPO | Admitting: Physician Assistant

## 2014-05-11 ENCOUNTER — Other Ambulatory Visit (HOSPITAL_COMMUNITY): Payer: BC Managed Care – PPO

## 2014-05-11 DIAGNOSIS — I079 Rheumatic tricuspid valve disease, unspecified: Secondary | ICD-10-CM | POA: Insufficient documentation

## 2014-05-11 DIAGNOSIS — R072 Precordial pain: Secondary | ICD-10-CM

## 2014-05-11 DIAGNOSIS — R011 Cardiac murmur, unspecified: Secondary | ICD-10-CM

## 2014-05-11 DIAGNOSIS — R079 Chest pain, unspecified: Secondary | ICD-10-CM

## 2014-05-11 NOTE — Progress Notes (Signed)
Echocardiogram performed.  

## 2014-05-11 NOTE — Progress Notes (Signed)
Exercise Treadmill Test  Pre-Exercise Testing Evaluation Rhythm: normal sinus  Rate: 64 bpm     Test  Exercise Tolerance Test Ordering MD: Donato SchultzMark Skains, MD  Interpreting MD: Tereso NewcomerScott Darol Cush, PA-C  Unique Test No: 1  Treadmill:  1  Indication for ETT: chest pain - rule out ischemia  Contraindication to ETT: No   Stress Modality: exercise - treadmill  Cardiac Imaging Performed: non   Protocol: standard Bruce - maximal  Max BP:  171/73  Max MPHR (bpm):  160 85% MPR (bpm):  136  MPHR obtained (bpm):  166 % MPHR obtained:  103  Reached 85% MPHR (min:sec):  4:20 Total Exercise Time (min-sec):  7:00  Workload in METS:  8.5 Borg Scale: 16  Reason ETT Terminated:  desired heart rate attained    ST Segment Analysis At Rest: normal ST segments - no evidence of significant ST depression With Exercise: no evidence of significant ST depression  Other Information Arrhythmia:  No Angina during ETT:  absent (0) Quality of ETT:  diagnostic  ETT Interpretation:  normal - no evidence of ischemia by ST analysis  Comments: Good exercise capacity. No chest pain. Normal BP response to exercise. No ST changes to suggest ischemia.   Recommendations: F/u with Dr. Donato SchultzMark Skains as directed. Signed,  Tereso NewcomerScott Samy Ryner, PA-C   05/11/2014 11:12 AM

## 2014-05-12 NOTE — Progress Notes (Signed)
Reassuring ETT.  Donato SchultzSKAINS, [name] Rodger, MD

## 2014-05-18 ENCOUNTER — Other Ambulatory Visit: Payer: Self-pay | Admitting: Emergency Medicine

## 2014-07-29 ENCOUNTER — Other Ambulatory Visit: Payer: Self-pay

## 2014-08-15 ENCOUNTER — Other Ambulatory Visit: Payer: Self-pay | Admitting: Physician Assistant

## 2014-11-12 ENCOUNTER — Other Ambulatory Visit: Payer: Self-pay | Admitting: Internal Medicine

## 2014-11-12 DIAGNOSIS — Z9119 Patient's noncompliance with other medical treatment and regimen: Secondary | ICD-10-CM

## 2014-11-12 DIAGNOSIS — Z8249 Family history of ischemic heart disease and other diseases of the circulatory system: Secondary | ICD-10-CM

## 2014-11-12 DIAGNOSIS — Z91199 Patient's noncompliance with other medical treatment and regimen due to unspecified reason: Secondary | ICD-10-CM | POA: Insufficient documentation

## 2014-11-12 DIAGNOSIS — R011 Cardiac murmur, unspecified: Secondary | ICD-10-CM

## 2015-01-12 ENCOUNTER — Ambulatory Visit (INDEPENDENT_AMBULATORY_CARE_PROVIDER_SITE_OTHER): Payer: BLUE CROSS/BLUE SHIELD | Admitting: Internal Medicine

## 2015-01-12 ENCOUNTER — Encounter: Payer: Self-pay | Admitting: Internal Medicine

## 2015-01-12 VITALS — BP 138/80 | HR 76 | Temp 98.0°F | Resp 18 | Ht 68.5 in | Wt 170.0 lb

## 2015-01-12 DIAGNOSIS — I10 Essential (primary) hypertension: Secondary | ICD-10-CM

## 2015-01-12 DIAGNOSIS — R739 Hyperglycemia, unspecified: Secondary | ICD-10-CM

## 2015-01-12 DIAGNOSIS — E785 Hyperlipidemia, unspecified: Secondary | ICD-10-CM

## 2015-01-12 DIAGNOSIS — M25561 Pain in right knee: Secondary | ICD-10-CM

## 2015-01-12 DIAGNOSIS — E559 Vitamin D deficiency, unspecified: Secondary | ICD-10-CM

## 2015-01-12 DIAGNOSIS — Z79899 Other long term (current) drug therapy: Secondary | ICD-10-CM

## 2015-01-12 LAB — CBC WITH DIFFERENTIAL/PLATELET
BASOS ABS: 0 10*3/uL (ref 0.0–0.1)
Basophils Relative: 0 % (ref 0–1)
EOS ABS: 0.2 10*3/uL (ref 0.0–0.7)
Eosinophils Relative: 2 % (ref 0–5)
HEMATOCRIT: 38.8 % — AB (ref 39.0–52.0)
HEMOGLOBIN: 13.2 g/dL (ref 13.0–17.0)
LYMPHS ABS: 2.4 10*3/uL (ref 0.7–4.0)
Lymphocytes Relative: 26 % (ref 12–46)
MCH: 29.5 pg (ref 26.0–34.0)
MCHC: 34 g/dL (ref 30.0–36.0)
MCV: 86.6 fL (ref 78.0–100.0)
MONO ABS: 0.6 10*3/uL (ref 0.1–1.0)
MONOS PCT: 6 % (ref 3–12)
MPV: 9.9 fL (ref 8.6–12.4)
NEUTROS PCT: 66 % (ref 43–77)
Neutro Abs: 6.2 10*3/uL (ref 1.7–7.7)
Platelets: 249 10*3/uL (ref 150–400)
RBC: 4.48 MIL/uL (ref 4.22–5.81)
RDW: 14.2 % (ref 11.5–15.5)
WBC: 9.4 10*3/uL (ref 4.0–10.5)

## 2015-01-12 MED ORDER — MELOXICAM 15 MG PO TABS: 15.0000 mg | ORAL_TABLET | Freq: Every day | ORAL | Status: DC

## 2015-01-12 NOTE — Patient Instructions (Signed)

## 2015-01-12 NOTE — Progress Notes (Signed)
Patient ID: [name] [name], male   DOB: 10/02/1956, 59 y.o.   MRN: [id number]  Assessment and Plan:  Hypertension:  -Continue medication,  -monitor blood pressure at home.  -Continue DASH diet.   -Reminder to go to the ER if any CP, SOB, nausea, dizziness, severe HA, changes vision/speech, left arm numbness and tingling, and jaw pain.  Cholesterol: -Continue diet and exercise.  -Check cholesterol.   Pre-diabetes: -Continue diet and exercise.  -Check A1C  Vitamin D Def: -check level -continue medications.   Right knee pain -likely arthritis vs. Less likely meniscal tear -mobic -referral to ortho  Continue diet and meds as discussed. Further disposition pending results of labs.  HPI 59 y.o. male  presents for 3 month follow up with hypertension, hyperlipidemia, prediabetes and vitamin D.   His blood pressure has been controlled at home, today their BP is BP: 138/80 mmHg.   He does workout. He denies chest pain, shortness of breath, dizziness.   He is on cholesterol medication and denies myalgias. His cholesterol is at goal. The cholesterol last visit was:   Lab Results  Component Value Date   CHOL 160 01/21/2014   HDL 45 01/21/2014   LDLCALC 98 01/21/2014   TRIG 86 01/21/2014   CHOLHDL 3.6 01/21/2014     He has been working on diet and exercise for prediabetes, and denies foot ulcerations, hyperglycemia, hypoglycemia , increased appetite, nausea, paresthesia of the feet, polydipsia, polyuria, visual disturbances, vomiting and weight loss. Last A1C in the office was:  Lab Results  Component Value Date   HGBA1C 6.0* 01/21/2014    Patient is on Vitamin D supplement.  Lab Results  Component Value Date   VD25OH 8873 04/14/2014     Patient states that for the last month he has had right knee pain.  It is severe and he has not noticed any swelling, popping, clicking or locking.  He notes that he has been trying advil which helps a little bit.  He has had no injury.     Current Medications:  Current Outpatient Prescriptions on File Prior to Visit  Medication Sig Dispense Refill  . aspirin 325 MG tablet Take 325 mg by mouth daily.    . Cholecalciferol (VITAMIN D3) 5000 UNITS TABS Take 5,000 Units by mouth daily.    . simvastatin (ZOCOR) 40 MG tablet TAKE 1 TABLET EVERY DAY *NEEDS APPT BEFORE THE END OF 90 DAYS* 90 tablet 0  . valsartan (DIOVAN) 80 MG tablet TAKE 1 TABLET EVERY DAY *NEEDS APPT BEFORE THE END OF 90 DAYS* 90 tablet 0   No current facility-administered medications on file prior to visit.    Medical History:  Past Medical History  Diagnosis Date  . Hyperlipidemia   . Hypertension   . Shingles   . Vitamin D deficiency     Allergies: No Known Allergies   Review of Systems:  Review of Systems  Constitutional: Negative for fever, chills and malaise/fatigue.  HENT: Negative for congestion, ear pain, nosebleeds, sore throat and tinnitus.   Eyes: Negative.   Respiratory: Negative for cough, sputum production, shortness of breath and wheezing.   Cardiovascular: Negative for chest pain, palpitations and leg swelling.  Gastrointestinal: Negative for heartburn, nausea, vomiting, abdominal pain, diarrhea, constipation, blood in stool and melena.  Genitourinary: Negative for dysuria, urgency, frequency and hematuria.  Musculoskeletal: Positive for joint pain.  Skin: Negative.   Neurological: Negative for dizziness, tingling, sensory change, weakness and headaches.    Family history- Review and unchanged  Social history- Review and unchanged  Physical Exam: BP 138/80 mmHg  Pulse 76  Temp(Src) 98 F (36.7 C) (Temporal)  Resp 18  Ht 5' 8.5" (1.74 m)  Wt 170 lb (77.111 kg)  BMI 25.47 kg/m2 Wt Readings from Last 3 Encounters:  01/12/15 170 lb (77.111 kg)  04/14/14 167 lb (75.751 kg)  02/18/14 163 lb (73.936 kg)    General Appearance: Well nourished well developed, in no apparent distress. Eyes: PERRLA, EOMs, conjunctiva no  swelling or erythema ENT/Mouth: Ear canals normal without obstruction, swelling, erythma, discharge.  TMs normal bilaterally.  Oropharynx moist, clear, without exudate, or postoropharyngeal swelling. Neck: Supple, thyroid normal,no cervical adenopathy  Respiratory: Respiratory effort normal, Breath sounds clear A&P without rhonchi, wheeze, or rale.  No retractions, no accessory usage. Cardio: RRR with no MRGs. Brisk peripheral pulses without edema.  Abdomen: Soft, + BS,  Non tender, no guarding, rebound, hernias, masses. Musculoskeletal: Right knee without swelling.  TTP on the medial joint line.  No other TTP.  Mild crepitus with passive ROM.  Full active ROM.  5/5 strength with flexion and extension.  Positive patellar grind.  All other special tests are negative.  Full ROM, 5/5 strength, Normal gait Skin: Warm, dry without rashes, lesions, ecchymosis.  Neuro: Awake and oriented X 3, Cranial nerves intact. Normal muscle tone, no cerebellar symptoms. Psych: Normal affect, Insight and Judgment appropriate.    FORCUCCI, Korban Shearer, PA-C 4:18 PM [facility]

## 2015-01-13 LAB — BASIC METABOLIC PANEL WITH GFR
BUN: 15 mg/dL (ref 6–23)
CO2: 30 meq/L (ref 19–32)
CREATININE: 0.86 mg/dL (ref 0.50–1.35)
Calcium: 9.6 mg/dL (ref 8.4–10.5)
Chloride: 104 mEq/L (ref 96–112)
GLUCOSE: 110 mg/dL — AB (ref 70–99)
Potassium: 3.9 mEq/L (ref 3.5–5.3)
SODIUM: 143 meq/L (ref 135–145)

## 2015-01-13 LAB — VITAMIN D 25 HYDROXY (VIT D DEFICIENCY, FRACTURES): Vit D, 25-Hydroxy: 32 ng/mL (ref 30–100)

## 2015-01-13 LAB — HEPATIC FUNCTION PANEL
ALBUMIN: 4.4 g/dL (ref 3.5–5.2)
ALT: 16 U/L (ref 0–53)
AST: 18 U/L (ref 0–37)
Alkaline Phosphatase: 59 U/L (ref 39–117)
BILIRUBIN INDIRECT: 0.3 mg/dL (ref 0.2–1.2)
BILIRUBIN TOTAL: 0.4 mg/dL (ref 0.2–1.2)
Bilirubin, Direct: 0.1 mg/dL (ref 0.0–0.3)
TOTAL PROTEIN: 7.2 g/dL (ref 6.0–8.3)

## 2015-01-13 LAB — LIPID PANEL
CHOL/HDL RATIO: 3.5 ratio
Cholesterol: 164 mg/dL (ref 0–200)
HDL: 47 mg/dL (ref >=40)
LDL Cholesterol: 92 mg/dL (ref 0–99)
Triglycerides: 124 mg/dL (ref <150)
VLDL: 25 mg/dL (ref 0–40)

## 2015-01-13 LAB — INSULIN, FASTING: INSULIN FASTING, SERUM: 54.1 u[IU]/mL — AB (ref 2.0–19.6)

## 2015-01-13 LAB — MAGNESIUM: Magnesium: 1.9 mg/dL (ref 1.5–2.5)

## 2015-01-13 LAB — HEMOGLOBIN A1C
HEMOGLOBIN A1C: 6.5 % — AB (ref <5.7)
Mean Plasma Glucose: 140 mg/dL — ABNORMAL HIGH (ref <117)

## 2015-01-13 LAB — TSH: TSH: 3.181 u[IU]/mL (ref 0.350–4.500)

## 2015-01-15 ENCOUNTER — Encounter: Payer: Self-pay | Admitting: Internal Medicine

## 2015-02-08 ENCOUNTER — Other Ambulatory Visit: Payer: Self-pay | Admitting: Physician Assistant

## 2015-05-01 ENCOUNTER — Encounter: Payer: Self-pay | Admitting: Emergency Medicine

## 2015-05-01 ENCOUNTER — Encounter: Payer: Self-pay | Admitting: Internal Medicine

## 2015-05-01 ENCOUNTER — Ambulatory Visit (INDEPENDENT_AMBULATORY_CARE_PROVIDER_SITE_OTHER): Payer: BLUE CROSS/BLUE SHIELD | Admitting: Internal Medicine

## 2015-05-01 VITALS — BP 106/70 | HR 84 | Temp 98.0°F | Resp 16 | Ht 68.25 in | Wt 158.0 lb

## 2015-05-01 DIAGNOSIS — Z9119 Patient's noncompliance with other medical treatment and regimen: Secondary | ICD-10-CM

## 2015-05-01 DIAGNOSIS — I10 Essential (primary) hypertension: Secondary | ICD-10-CM

## 2015-05-01 DIAGNOSIS — E785 Hyperlipidemia, unspecified: Secondary | ICD-10-CM

## 2015-05-01 DIAGNOSIS — R5383 Other fatigue: Secondary | ICD-10-CM

## 2015-05-01 DIAGNOSIS — R7303 Prediabetes: Secondary | ICD-10-CM

## 2015-05-01 DIAGNOSIS — E559 Vitamin D deficiency, unspecified: Secondary | ICD-10-CM

## 2015-05-01 DIAGNOSIS — Z91199 Patient's noncompliance with other medical treatment and regimen due to unspecified reason: Secondary | ICD-10-CM

## 2015-05-01 DIAGNOSIS — Z125 Encounter for screening for malignant neoplasm of prostate: Secondary | ICD-10-CM

## 2015-05-01 DIAGNOSIS — Z1212 Encounter for screening for malignant neoplasm of rectum: Secondary | ICD-10-CM

## 2015-05-01 DIAGNOSIS — Z0000 Encounter for general adult medical examination without abnormal findings: Secondary | ICD-10-CM

## 2015-05-01 DIAGNOSIS — Z79899 Other long term (current) drug therapy: Secondary | ICD-10-CM

## 2015-05-01 DIAGNOSIS — IMO0001 Reserved for inherently not codable concepts without codable children: Secondary | ICD-10-CM

## 2015-05-01 LAB — HEPATIC FUNCTION PANEL
ALBUMIN: 4.2 g/dL (ref 3.5–5.2)
ALK PHOS: 56 U/L (ref 39–117)
ALT: 166 U/L — AB (ref 0–53)
AST: 69 U/L — AB (ref 0–37)
BILIRUBIN DIRECT: 0.1 mg/dL (ref 0.0–0.3)
BILIRUBIN INDIRECT: 0.5 mg/dL (ref 0.2–1.2)
TOTAL PROTEIN: 7 g/dL (ref 6.0–8.3)
Total Bilirubin: 0.6 mg/dL (ref 0.2–1.2)

## 2015-05-01 LAB — LIPID PANEL
Cholesterol: 131 mg/dL (ref 0–200)
HDL: 44 mg/dL (ref >=40)
LDL Cholesterol: 71 mg/dL (ref 0–99)
Total CHOL/HDL Ratio: 3 Ratio
Triglycerides: 79 mg/dL (ref <150)
VLDL: 16 mg/dL (ref 0–40)

## 2015-05-01 LAB — CBC WITH DIFFERENTIAL/PLATELET
Basophils Absolute: 0 10*3/uL (ref 0.0–0.1)
Basophils Relative: 0 % (ref 0–1)
EOS ABS: 0.1 10*3/uL (ref 0.0–0.7)
Eosinophils Relative: 1 % (ref 0–5)
HCT: 39.6 % (ref 39.0–52.0)
Hemoglobin: 13.3 g/dL (ref 13.0–17.0)
Lymphocytes Relative: 28 % (ref 12–46)
Lymphs Abs: 1.9 10*3/uL (ref 0.7–4.0)
MCH: 29.7 pg (ref 26.0–34.0)
MCHC: 33.6 g/dL (ref 30.0–36.0)
MCV: 88.4 fL (ref 78.0–100.0)
MONO ABS: 0.7 10*3/uL (ref 0.1–1.0)
MPV: 10.3 fL (ref 8.6–12.4)
Monocytes Relative: 11 % (ref 3–12)
Neutro Abs: 4 10*3/uL (ref 1.7–7.7)
Neutrophils Relative %: 60 % (ref 43–77)
Platelets: 254 10*3/uL (ref 150–400)
RBC: 4.48 MIL/uL (ref 4.22–5.81)
RDW: 14 % (ref 11.5–15.5)
WBC: 6.7 10*3/uL (ref 4.0–10.5)

## 2015-05-01 LAB — IRON AND TIBC
%SAT: 33 % (ref 20–55)
IRON: 115 ug/dL (ref 42–165)
TIBC: 350 ug/dL (ref 215–435)
UIBC: 235 ug/dL (ref 125–400)

## 2015-05-01 LAB — BASIC METABOLIC PANEL WITH GFR
BUN: 20 mg/dL (ref 6–23)
CO2: 29 meq/L (ref 19–32)
Calcium: 9.8 mg/dL (ref 8.4–10.5)
Chloride: 103 mEq/L (ref 96–112)
Creat: 0.89 mg/dL (ref 0.50–1.35)
GFR, Est African American: >89 mL/min
GFR, Est Non African American: >89 mL/min
GLUCOSE: 89 mg/dL (ref 70–99)
Potassium: 4.3 mEq/L (ref 3.5–5.3)
Sodium: 141 mEq/L (ref 135–145)

## 2015-05-01 LAB — VITAMIN B12: Vitamin B-12: 592 pg/mL (ref 211–911)

## 2015-05-01 LAB — TSH: TSH: 1.178 u[IU]/mL (ref 0.350–4.500)

## 2015-05-01 LAB — MAGNESIUM: MAGNESIUM: 1.8 mg/dL (ref 1.5–2.5)

## 2015-05-01 NOTE — Progress Notes (Signed)
Patient ID: [name] SpragueDonald Matchett, male   DOB: 08/23/1956, 59 y.o.   MRN: [id number]  Complete Physical  Assessment and Plan:   1. Essential hypertension, benign  - Urinalysis, Routine w reflex microscopic ([facility]) - Microalbumin / creatinine urine ratio - EKG 12-Lead - US, RETROPERITNL ABD,  LTD - TSH  2. Hyperlipidemia -cont meds -diet and exercise - Lipid panel  3. Noncompliance -patient really not open to suggestions from me regarding changes to blood pressure medications  4. Prediabetes  - Hemoglobin A1c - Insulin, random  5. Vitamin D deficiency -cont supplement - Vit D  25 hydroxy (rtn osteoporosis monitoring)  6. Medication management  - CBC with Differential/Platelet - BASIC METABOLIC PANEL WITH GFR - Hepatic function panel - Magnesium  7. Other fatigue  - Iron and TIBC - Vitamin B12  8. Screening for rectal cancer  - POC Hemoccult Bld/Stl (3-Cd Home Screen); Future  9. Screening for prostate cancer -needs prostate exam at next visit with Dr. Oneta RackMcKeown - PSA    Discussed med's effects and SE's. Screening labs and tests as requested with regular follow-up as recommended.  HPI Patient presents for a complete physical.   His blood pressure has been controlled at home, today their BP is BP: 106/70 mmHg He does workout. He denies chest pain, shortness of breath.  He reports that he gets a little bit of walking in daily.  He does have some dizziness with standing quickly.  He does report that he has changed his diet and is now on a low carb diet and is only drinking water.     He is on cholesterol medication and denies myalgias. His cholesterol is at goal. The cholesterol last visit was:   Lab Results  Component Value Date   CHOL 164 01/12/2015   HDL 47 01/12/2015   LDLCALC 92 01/12/2015   TRIG 124 01/12/2015   CHOLHDL 3.5 01/12/2015    He has been working on diet and exercise for prediabetes, he is not on bASA, he is on ACE/ARB and denies foot  ulcerations, hyperglycemia, hypoglycemia , increased appetite, nausea, paresthesia of the feet, polydipsia, polyuria, visual disturbances, vomiting and weight loss. Last A1C in the office was:  Lab Results  Component Value Date   HGBA1C 6.5* 01/12/2015    Patient is on Vitamin D supplement.   Lab Results  Component Value Date   VD25OH 32 01/12/2015      Last PSA was: Lab Results  Component Value Date   PSA 1.77 04/14/2014  .  Denies BPH symptoms daytime frequency, double voiding, dysuria, hematuria, hesitancy, incontinence, intermittency, nocturia, sensation of incomplete bladder emptying, suprapubic pain, urgency or weak urinary stream.  He does report that his knee is feeling much better.  He reports that he had a cortisone injection which helped nearly completely.    Current Medications:  Current Outpatient Prescriptions on File Prior to Visit  Medication Sig Dispense Refill  . Cholecalciferol (VITAMIN D3) 5000 UNITS TABS Take 5,000 Units by mouth daily.    . simvastatin (ZOCOR) 40 MG tablet Take 1 tablet at bedtime 90 tablet 1  . valsartan (DIOVAN) 80 MG tablet TAKE 1 TABLET EVERY DAY *NEEDS APPT BEFORE THE END OF 90 DAYS* 90 tablet 0   No current facility-administered medications on file prior to visit.    Health Maintenance:  Immunization History  Administered Date(s) Administered  . Tdap 10/19/2008   Pneumovax: Prevnar 13: Flu vaccine:  Due in Fall Zostavax: Not indicated  Colonoscopy:  Up to date. Eye Exam: 2016 Dentist: Twice yearly.  Patient Care Team: [name], MD as PCP - General (Internal Medicine) Sharrell Ku, MD as Consulting Physician (Gastroenterology) Antonietta Barcelona, OD as Referring Physician (Optometry) Beatrice Lecher, PA-C as Physician Assistant (Physician Assistant) Nils Flack, MD as Referring Physician (Dermatology)  Allergies: No Known Allergies  Medical History:  Past Medical History  Diagnosis Date  . Hyperlipidemia    . Hypertension   . Shingles   . Vitamin D deficiency     Surgical History:  Past Surgical History  Procedure Laterality Date  . Shoulder arthroscopy      Family History:  Family History  Problem Relation Age of Onset  . Cancer Mother     colon, lung  . Heart disease Father   . Hyperlipidemia Father   . Heart attack Father     Social History:   History  Substance Use Topics  . Smoking status: Never Smoker   . Smokeless tobacco: Not on file  . Alcohol Use: No    Review of Systems:  Review of Systems  Constitutional: Positive for weight loss and malaise/fatigue. Negative for fever and chills.  HENT: Negative for congestion, ear discharge and sore throat.   Eyes: Negative.   Respiratory: Negative for cough, shortness of breath and wheezing.   Cardiovascular: Negative for chest pain, palpitations and leg swelling.  Gastrointestinal: Negative for heartburn, nausea, vomiting, diarrhea, constipation, blood in stool and melena.  Genitourinary: Negative.   Skin: Negative.   Neurological: Positive for dizziness. Negative for sensory change, loss of consciousness and headaches.  Psychiatric/Behavioral: Negative for depression. The patient is not nervous/anxious and does not have insomnia.     Physical Exam: Estimated body mass index is 23.84 kg/(m^2) as calculated from the following:   Height as of this encounter: 5' 8.25" (1.734 m).   Weight as of this encounter: 158 lb (71.668 kg). BP 106/70 mmHg  Pulse 84  Temp(Src) 98 F (36.7 C) (Temporal)  Resp 16  Ht 5' 8.25" (1.734 m)  Wt 158 lb (71.668 kg)  BMI 23.84 kg/m2  Wt Readings from Last 3 Encounters:  05/01/15 158 lb (71.668 kg)  01/12/15 170 lb (77.111 kg)  04/14/14 167 lb (75.751 kg)   General Appearance: Well nourished, in no apparent distress.  Eyes: PERRLA, EOMs, conjunctiva no swelling or erythema ENT/Mouth: Ear canals clear bilaterally with no erythema, swelling, discharge.  TMs normal bilaterally with no  erythema, bulging, or retractions.  Oropharynx clear and moist with no exudate, swelling, or erythema.  Dentition normal.   Neck: Supple, thyroid normal. No bruits, JVD, cervical adenopathy Respiratory: Respiratory effort normal, BS equal bilaterally without rales, rhonchi, wheezing or stridor.  Cardio: RRR without murmurs, rubs or gallops. Brisk peripheral pulses without edema.  Chest: symmetric, with normal excursions Abdomen: Soft, nontender, no guarding, rebound, hernias, masses, or organomegaly. Genitourinary: Patient deferred and would like to have this done by a male provider at his next visit.   Musculoskeletal: Full ROM all peripheral extremities,5/5 strength, and normal gait.  Skin: Warm, dry without rashes, lesions, ecchymosis. Neuro: A&Ox3, Cranial nerves intact, reflexes equal bilaterally. Normal muscle tone, no cerebellar symptoms. Sensation intact.  Psych: Normal affect, Insight and Judgment appropriate.   EKG: WNL no changes.  AORTA SCAN: WNL  Over 40 minutes of exam, counseling, chart review and critical decision making was performed  Toni Amend Forcucci 1:57 PM [facility]

## 2015-05-01 NOTE — Patient Instructions (Signed)
Preventive Care for Adults  A healthy lifestyle and preventive care can promote health and wellness. Preventive health guidelines for men include the following key practices:  A routine yearly physical is a good way to check with your health care provider about your health and preventative screening. It is a chance to share any concerns and updates on your health and to receive a thorough exam.  Visit your dentist for a routine exam and preventative care every 6 months. Brush your teeth twice a day and floss once a day. Good oral hygiene prevents tooth decay and gum disease.  The frequency of eye exams is based on your age, health, family medical history, use of contact lenses, and other factors. Follow your health care provider's recommendations for frequency of eye exams.  Eat a healthy diet. Foods such as vegetables, fruits, whole grains, low-fat dairy products, and lean protein foods contain the nutrients you need without too many calories. Decrease your intake of foods high in solid fats, added sugars, and salt. Eat the right amount of calories for you.Get information about a proper diet from your health care provider, if necessary.  Regular physical exercise is one of the most important things you can do for your health. Most adults should get at least 150 minutes of moderate-intensity exercise (any activity that increases your heart rate and causes you to sweat) each week. In addition, most adults need muscle-strengthening exercises on 2 or more days a week.  Maintain a healthy weight. The body mass index (BMI) is a screening tool to identify possible weight problems. It provides an estimate of body fat based on height and weight. Your health care provider can find your BMI and can help you achieve or maintain a healthy weight.For adults 20 years and older:  A BMI below 18.5 is considered underweight.  A BMI of 18.5 to 24.9 is normal.  A BMI of 25 to 29.9 is considered overweight.  A  BMI of 30 and above is considered obese.  Maintain normal blood lipids and cholesterol levels by exercising and minimizing your intake of saturated fat. Eat a balanced diet with plenty of fruit and vegetables. Blood tests for lipids and cholesterol should begin at age 20 and be repeated every 5 years. If your lipid or cholesterol levels are high, you are over 50, or you are at high risk for heart disease, you may need your cholesterol levels checked more frequently.Ongoing high lipid and cholesterol levels should be treated with medicines if diet and exercise are not working.  If you smoke, find out from your health care provider how to quit. If you do not use tobacco, do not start.  Lung cancer screening is recommended for adults aged 55-80 years who are at high risk for developing lung cancer because of a history of smoking. A yearly low-dose CT scan of the lungs is recommended for people who have at least a 30-pack-year history of smoking and are a current smoker or have quit within the past 15 years. A pack year of smoking is smoking an average of 1 pack of cigarettes a day for 1 year (for example: 1 pack a day for 30 years or 2 packs a day for 15 years). Yearly screening should continue until the smoker has stopped smoking for at least 15 years. Yearly screening should be stopped for people who develop a health problem that would prevent them from having lung cancer treatment.  If you choose to drink alcohol, do not have more   than 2 drinks per day. One drink is considered to be 12 ounces (355 mL) of beer, 5 ounces (148 mL) of wine, or 1.5 ounces (44 mL) of liquor.  Avoid use of street drugs. Do not share needles with anyone. Ask for help if you need support or instructions about stopping the use of drugs.  High blood pressure causes heart disease and increases the risk of stroke. Your blood pressure should be checked at least every 1-2 years. Ongoing high blood pressure should be treated with  medicines, if weight loss and exercise are not effective.  If you are 45-79 years old, ask your health care provider if you should take aspirin to prevent heart disease.  Diabetes screening involves taking a blood sample to check your fasting blood sugar level. This should be done once every 3 years, after age 45, if you are within normal weight and without risk factors for diabetes. Testing should be considered at a younger age or be carried out more frequently if you are overweight and have at least 1 risk factor for diabetes.  Colorectal cancer can be detected and often prevented. Most routine colorectal cancer screening begins at the age of 50 and continues through age 75. However, your health care provider may recommend screening at an earlier age if you have risk factors for colon cancer. On a yearly basis, your health care provider may provide home test kits to check for hidden blood in the stool. Use of a small camera at the end of a tube to directly examine the colon (sigmoidoscopy or colonoscopy) can detect the earliest forms of colorectal cancer. Talk to your health care provider about this at age 50, when routine screening begins. Direct exam of the colon should be repeated every 5-10 years through age 75, unless early forms of precancerous polyps or small growths are found.   Talk with your health care provider about prostate cancer screening.  Testicular cancer screening isrecommended for adult males. Screening includes self-exam, a health care provider exam, and other screening tests. Consult with your health care provider about any symptoms you have or any concerns you have about testicular cancer.  Use sunscreen. Apply sunscreen liberally and repeatedly throughout the day. You should seek shade when your shadow is shorter than you. Protect yourself by wearing long sleeves, pants, a wide-brimmed hat, and sunglasses year round, whenever you are outdoors.  Once a month, do a whole-body  skin exam, using a mirror to look at the skin on your back. Tell your health care provider about new moles, moles that have irregular borders, moles that are larger than a pencil eraser, or moles that have changed in shape or color.  Stay current with required vaccines (immunizations).  Influenza vaccine. All adults should be immunized every year.  Tetanus, diphtheria, and acellular pertussis (Td, Tdap) vaccine. An adult who has not previously received Tdap or who does not know his vaccine status should receive 1 dose of Tdap. This initial dose should be followed by tetanus and diphtheria toxoids (Td) booster doses every 10 years. Adults with an unknown or incomplete history of completing a 3-dose immunization series with Td-containing vaccines should begin or complete a primary immunization series including a Tdap dose. Adults should receive a Td booster every 10 years.  Varicella vaccine. An adult without evidence of immunity to varicella should receive 2 doses or a second dose if he has previously received 1 dose.  Human papillomavirus (HPV) vaccine. Males aged 13-21 years who have not   received the vaccine previously should receive the 3-dose series. Males aged 22-26 years may be immunized. Immunization is recommended through the age of 26 years for any male who has sex with males and did not get any or all doses earlier. Immunization is recommended for any person with an immunocompromised condition through the age of 26 years if he did not get any or all doses earlier. During the 3-dose series, the second dose should be obtained 4-8 weeks after the first dose. The third dose should be obtained 24 weeks after the first dose and 16 weeks after the second dose.  Zoster vaccine. One dose is recommended for adults aged 60 years or older unless certain conditions are present.    PREVNAR  - Pneumococcal 13-valent conjugate (PCV13) vaccine. When indicated, a person who is uncertain of his immunization  history and has no record of immunization should receive the PCV13 vaccine. An adult aged 19 years or older who has certain medical conditions and has not been previously immunized should receive 1 dose of PCV13 vaccine. This PCV13 should be followed with a dose of pneumococcal polysaccharide (PPSV23) vaccine. The PPSV23 vaccine dose should be obtained at least 8 weeks after the dose of PCV13 vaccine. An adult aged 19 years or older who has certain medical conditions and previously received 1 or more doses of PPSV23 vaccine should receive 1 dose of PCV13. The PCV13 vaccine dose should be obtained 1 or more years after the last PPSV23 vaccine dose.    PNEUMOVAX - Pneumococcal polysaccharide (PPSV23) vaccine. When PCV13 is also indicated, PCV13 should be obtained first. All adults aged 65 years and older should be immunized. An adult younger than age 65 years who has certain medical conditions should be immunized. Any person who resides in a nursing home or long-term care facility should be immunized. An adult smoker should be immunized. People with an immunocompromised condition and certain other conditions should receive both PCV13 and PPSV23 vaccines. People with human immunodeficiency virus (HIV) infection should be immunized as soon as possible after diagnosis. Immunization during chemotherapy or radiation therapy should be avoided. Routine use of PPSV23 vaccine is not recommended for American Indians, [place] Natives, or people younger than 65 years unless there are medical conditions that require PPSV23 vaccine. When indicated, people who have unknown immunization and have no record of immunization should receive PPSV23 vaccine. One-time revaccination 5 years after the first dose of PPSV23 is recommended for people aged 19-64 years who have chronic kidney failure, nephrotic syndrome, asplenia, or immunocompromised conditions. People who received 1-2 doses of PPSV23 before age 65 years should receive another  dose of PPSV23 vaccine at age 65 years or later if at least 5 years have passed since the previous dose. Doses of PPSV23 are not needed for people immunized with PPSV23 at or after age 65 years.    Hepatitis A vaccine. Adults who wish to be protected from this disease, have certain high-risk conditions, work with hepatitis A-infected animals, work in hepatitis A research labs, or travel to or work in countries with a high rate of hepatitis A should be immunized. Adults who were previously unvaccinated and who anticipate close contact with an international adoptee during the first 60 days after arrival in the [place] from a country with a high rate of hepatitis A should be immunized.    Hepatitis B vaccine. Adults should be immunized if they wish to be protected from this disease, have certain high-risk conditions, may be exposed to   blood or other infectious body fluids, are household contacts or sex partners of hepatitis B positive people, are clients or workers in certain care facilities, or travel to or work in countries with a high rate of hepatitis B.   Preventive Service / Frequency   Ages 40 to 64  Blood pressure check.  Lipid and cholesterol check  Lung cancer screening. / Every year if you are aged 55-80 years and have a 30-pack-year history of smoking and currently smoke or have quit within the past 15 years. Yearly screening is stopped once you have quit smoking for at least 15 years or develop a health problem that would prevent you from having lung cancer treatment.  Fecal occult blood test (FOBT) of stool. / Every year beginning at age 50 and continuing until age 75. You may not have to do this test if you get a colonoscopy every 10 years.  Flexible sigmoidoscopy** or colonoscopy.** / Every 5 years for a flexible sigmoidoscopy or every 10 years for a colonoscopy beginning at age 50 and continuing until age 75. Screening for abdominal aortic aneurysm (AAA)  by ultrasound is  recommended for people who have history of high blood pressure or who are current or former smokers.   

## 2015-05-02 LAB — URINALYSIS, ROUTINE W REFLEX MICROSCOPIC
GLUCOSE, UA: NEGATIVE mg/dL
HGB URINE DIPSTICK: NEGATIVE
Leukocytes, UA: NEGATIVE
NITRITE: NEGATIVE
Protein, ur: NEGATIVE mg/dL
Specific Gravity, Urine: 1.028 (ref 1.005–1.030)
UROBILINOGEN UA: 0.2 mg/dL (ref 0.0–1.0)
pH: 5 (ref 5.0–8.0)

## 2015-05-02 LAB — VITAMIN D 25 HYDROXY (VIT D DEFICIENCY, FRACTURES): Vit D, 25-Hydroxy: 40 ng/mL (ref 30–100)

## 2015-05-02 LAB — MICROALBUMIN / CREATININE URINE RATIO
Creatinine, Urine: 444.8 mg/dL
MICROALB/CREAT RATIO: 9 mg/g (ref 0.0–30.0)
Microalb, Ur: 4 mg/dL — ABNORMAL HIGH (ref <2.0)

## 2015-05-02 LAB — INSULIN, RANDOM: Insulin: 11 u[IU]/mL (ref 2.0–19.6)

## 2015-05-02 LAB — HEMOGLOBIN A1C
Hgb A1c MFr Bld: 5.9 % — ABNORMAL HIGH (ref <5.7)
MEAN PLASMA GLUCOSE: 123 mg/dL — AB (ref <117)

## 2015-05-02 LAB — PSA: PSA: 2.32 ng/mL (ref <=4.00)

## 2015-06-14 ENCOUNTER — Other Ambulatory Visit: Payer: Self-pay | Admitting: Internal Medicine

## 2015-08-22 ENCOUNTER — Ambulatory Visit (INDEPENDENT_AMBULATORY_CARE_PROVIDER_SITE_OTHER): Payer: BLUE CROSS/BLUE SHIELD | Admitting: Internal Medicine

## 2015-08-22 ENCOUNTER — Encounter: Payer: Self-pay | Admitting: Internal Medicine

## 2015-08-22 VITALS — BP 116/70 | HR 68 | Temp 98.0°F | Resp 18 | Ht 68.25 in | Wt 164.0 lb

## 2015-08-22 DIAGNOSIS — K4020 Bilateral inguinal hernia, without obstruction or gangrene, not specified as recurrent: Secondary | ICD-10-CM

## 2015-08-22 NOTE — Progress Notes (Signed)
  Subjective:    Patient ID: [name] [name], male    DOB: 07/22/1956, 59 y.o.   MRN: [id number]  HPI  Patient presents with c/o insidious development of bulges in each inguinal area. Denies any GI sx's or abdominal discomfort. Not affected by lifting or straining.   Medication Sig  . Cholecalciferol (VITAMIN D3) 5000 UNITS TABS Take 5,000 Units by mouth daily.  . simvastatin (ZOCOR) 40 MG tablet Take 1 tablet at bedtime  . valsartan (DIOVAN) 80 MG tablet TAKE 1 TABLET EVERY DAY *NEEDS APPT BEFORE THE END OF 90 DAYS*   No Known Allergies   Past Medical History  Diagnosis Date  . Hyperlipidemia   . Hypertension   . Shingles   . Vitamin D deficiency    Past Surgical History  Procedure Laterality Date  . Shoulder arthroscopy     Review of Systems  10 point systems review negative except as above.    Objective:   Physical Exam  BP 116/70 mmHg  Pulse 68  Temp(Src) 98 F (36.7 C) (Temporal)  Resp 18  Ht 5' 8.25" (1.734 m)  Wt 164 lb (74.39 kg)  BMI 24.74 kg/m2  HEENT - Eac's patent. TM's Nl. EOM's full. PERRLA. NasoOroPharynx clear. Neck - supple. Nl Thyroid. Carotids 2+ & No bruits, nodes, JVD Chest - Clear equal BS w/o Rales, rhonchi, wheezes. Cor - Nl HS. RRR w/o sig MGR. PP 1(+). No edema. Abd - No palpable organomegaly, masses or tenderness. BS nl. Small reducible RIH & larger non-reducible LIH (w/o discomfort). MS- FROM w/o deformities. Muscle power, tone and bulk Nl. Gait Nl. Neuro - No obvious Cr N abnormalities. Sensory, motor and Cerebellar functions appear Nl w/o focal abnormalities. Psyche - Mental status normal & appropriate.  No delusions, ideations or obvious mood abnormalities.    Assessment & Plan:   1. Bilateral inguinal hernia without obstruction or gangrene  - Ambulatory referral to General Surgery

## 2015-08-22 NOTE — Patient Instructions (Signed)

## 2015-09-18 ENCOUNTER — Other Ambulatory Visit: Payer: Self-pay | Admitting: Internal Medicine

## 2015-09-21 ENCOUNTER — Ambulatory Visit: Payer: Self-pay | Admitting: General Surgery

## 2015-09-22 NOTE — Patient Instructions (Addendum)
[name] HillDonald Fojtik  09/22/2015   Your procedure is scheduled on: 09/28/2015    [facility]  [facility]  [facility] to 3rd floor to  Short Stay Center at    1045 AM.  Call this number if you have problems the morning of surgery [phone]   Remember: ONLY 1 PERSON MAY GO WITH YOU TO SHORT STAY TO GET  READY MORNING OF YOUR SURGERY.  Do not eat food or drink liquids :After Midnight.     Take these medicines the morning of surgery with A SIP OF WATER: NONE  DO NOT TAKE ANY DIABETIC MEDICATIONS DAY OF YOUR SURGERY                               You may not have any metal on your body including hair pins and              piercings  Do not wear jewelry,  lotions, powders or perfumes, deodorant                      Men may shave face and neck.   Do not bring valuables to the hospital. [facility] IS NOT             RESPONSIBLE   FOR VALUABLES.  Contacts, dentures or bridgework may not be worn into surgery.  Leave suitcase in the car. After surgery it may be brought to your room.       Special Instructions:  Coughing and deep breathing exercises, leg exercises               Please read over the following fact sheets you were given: _____________________________________________________________________             [facility] Pre[facility] Before surgery, you can play an important role.  Because skin is not sterile, your skin needs to be as free of germs as possible.  You can reduce the number of germs on your skin by washing with CHG (chlorahexidine gluconate) soap before surgery.  CHG is an antiseptic cleaner which kills germs and bonds with the skin to continue killing germs even after washing. Please DO NOT use if you have an allergy to CHG or antibacterial soaps.  If your skin becomes reddened/irritated stop using the CHG and inform your nurse when you arrive at Short Stay. Do not shave (including legs and underarms) for at least  48 hours prior to the first CHG shower.  You may shave your face/neck. Please follow these instructions carefully:  1.  Shower with CHG Soap the night before surgery and the  morning of Surgery.  2.  If you choose to wash your hair, wash your hair first as usual with your  normal  shampoo.  3.  After you shampoo, rinse your hair and body thoroughly to remove the  shampoo.                           4.  Use CHG as you would any other liquid soap.  You can apply chg directly  to the skin and wash                       Gently with a scrungie or clean  washcloth.  5.  Apply the CHG Soap to your body ONLY FROM THE NECK DOWN.   Do not use on face/ open                           Wound or open sores. Avoid contact with eyes, ears mouth and genitals (private parts).                       Wash face,  Genitals (private parts) with your normal soap.             6.  Wash thoroughly, paying special attention to the area where your surgery  will be performed.  7.  Thoroughly rinse your body with warm water from the neck down.  8.  DO NOT shower/wash with your normal soap after using and rinsing off  the CHG Soap.                9.  Pat yourself dry with a clean towel.            10.  Wear clean pajamas.            11.  Place clean sheets on your bed the night of your first shower and do not  sleep with pets. Day of Surgery : Do not apply any lotions/deodorants the morning of surgery.  Please wear clean clothes to the hospital/surgery center.  FAILURE TO FOLLOW THESE INSTRUCTIONS MAY RESULT IN THE CANCELLATION OF YOUR SURGERY PATIENT SIGNATURE_________________________________  NURSE SIGNATURE__________________________________  ________________________________________________________________________

## 2015-09-25 ENCOUNTER — Encounter (HOSPITAL_COMMUNITY)
Admission: RE | Admit: 2015-09-25 | Discharge: 2015-09-25 | Disposition: A | Discharge status: Home / Self Care | Payer: BLUE CROSS/BLUE SHIELD | Source: Ambulatory Visit | Attending: General Surgery | Admitting: General Surgery

## 2015-09-25 ENCOUNTER — Other Ambulatory Visit: Payer: Self-pay | Admitting: Physician Assistant

## 2015-09-25 ENCOUNTER — Encounter (HOSPITAL_COMMUNITY): Payer: Self-pay

## 2015-09-25 DIAGNOSIS — K4030 Unilateral inguinal hernia, with obstruction, without gangrene, not specified as recurrent: Secondary | ICD-10-CM | POA: Diagnosis not present

## 2015-09-25 DIAGNOSIS — E7800 Pure hypercholesterolemia, unspecified: Secondary | ICD-10-CM | POA: Diagnosis not present

## 2015-09-25 DIAGNOSIS — Z79899 Other long term (current) drug therapy: Secondary | ICD-10-CM | POA: Diagnosis not present

## 2015-09-25 DIAGNOSIS — I10 Essential (primary) hypertension: Secondary | ICD-10-CM | POA: Diagnosis not present

## 2015-09-25 HISTORY — DX: Cardiac murmur, unspecified: R01.1

## 2015-09-25 LAB — BASIC METABOLIC PANEL
Anion gap: 8 (ref 5–15)
BUN: 19 mg/dL (ref 6–20)
CHLORIDE: 106 mmol/L (ref 101–111)
CO2: 27 mmol/L (ref 22–32)
Calcium: 9.2 mg/dL (ref 8.9–10.3)
Creatinine, Ser: 0.77 mg/dL (ref 0.61–1.24)
GFR calc Af Amer: >60 mL/min (ref >60)
GFR calc non Af Amer: >60 mL/min (ref >60)
Glucose, Bld: 123 mg/dL — ABNORMAL HIGH (ref 65–99)
POTASSIUM: 4.1 mmol/L (ref 3.5–5.1)
SODIUM: 141 mmol/L (ref 135–145)

## 2015-09-25 LAB — CBC
HEMATOCRIT: 37.7 % — AB (ref 39.0–52.0)
Hemoglobin: 12.6 g/dL — ABNORMAL LOW (ref 13.0–17.0)
MCH: 29.9 pg (ref 26.0–34.0)
MCHC: 33.4 g/dL (ref 30.0–36.0)
MCV: 89.5 fL (ref 78.0–100.0)
Platelets: 232 10*3/uL (ref 150–400)
RBC: 4.21 MIL/uL — ABNORMAL LOW (ref 4.22–5.81)
RDW: 13.3 % (ref 11.5–15.5)
WBC: 10.2 10*3/uL (ref 4.0–10.5)

## 2015-09-25 NOTE — Progress Notes (Addendum)
EKG- 05/01/15- EPIC  [id number] EPIC

## 2015-09-28 ENCOUNTER — Ambulatory Visit (HOSPITAL_COMMUNITY): Payer: BLUE CROSS/BLUE SHIELD | Admitting: Certified Registered Nurse Anesthetist

## 2015-09-28 ENCOUNTER — Encounter (HOSPITAL_COMMUNITY): Payer: Self-pay | Admitting: *Deleted

## 2015-09-28 ENCOUNTER — Encounter (HOSPITAL_COMMUNITY)
Admission: RE | Disposition: A | Discharge status: Home / Self Care | Payer: Self-pay | Source: Ambulatory Visit | Attending: General Surgery

## 2015-09-28 ENCOUNTER — Ambulatory Visit (HOSPITAL_COMMUNITY)
Admission: RE | Admit: 2015-09-28 | Discharge: 2015-09-28 | Disposition: A | Discharge status: Home / Self Care | Payer: BLUE CROSS/BLUE SHIELD | Source: Ambulatory Visit | Attending: General Surgery | Admitting: General Surgery

## 2015-09-28 DIAGNOSIS — K4030 Unilateral inguinal hernia, with obstruction, without gangrene, not specified as recurrent: Secondary | ICD-10-CM | POA: Insufficient documentation

## 2015-09-28 DIAGNOSIS — E7800 Pure hypercholesterolemia, unspecified: Secondary | ICD-10-CM | POA: Insufficient documentation

## 2015-09-28 DIAGNOSIS — Z79899 Other long term (current) drug therapy: Secondary | ICD-10-CM | POA: Insufficient documentation

## 2015-09-28 DIAGNOSIS — I10 Essential (primary) hypertension: Secondary | ICD-10-CM | POA: Insufficient documentation

## 2015-09-28 HISTORY — PX: INGUINAL HERNIA REPAIR: SHX194

## 2015-09-28 HISTORY — PX: INSERTION OF MESH: SHX5868

## 2015-09-28 SURGERY — REPAIR, HERNIA, INGUINAL, BILATERAL, LAPAROSCOPIC
Anesthesia: General | Laterality: Bilateral

## 2015-09-28 MED ORDER — ROCURONIUM BROMIDE 100 MG/10ML IV SOLN
INTRAVENOUS | Status: AC
  Filled 2015-09-28: qty 1

## 2015-09-28 MED ORDER — SUGAMMADEX SODIUM 200 MG/2ML IV SOLN
INTRAVENOUS | Status: AC
  Filled 2015-09-28: qty 2

## 2015-09-28 MED ORDER — CEFAZOLIN SODIUM-DEXTROSE 2-3 GM-% IV SOLR
INTRAVENOUS | Status: AC
  Filled 2015-09-28: qty 50

## 2015-09-28 MED ORDER — ROCURONIUM BROMIDE 100 MG/10ML IV SOLN
INTRAVENOUS | Status: DC | PRN
  Administered 2015-09-28: 20 mg via INTRAVENOUS
  Administered 2015-09-28: 50 mg via INTRAVENOUS

## 2015-09-28 MED ORDER — HYDROMORPHONE HCL 1 MG/ML IJ SOLN
0.2500 mg | INTRAMUSCULAR | Status: DC | PRN
  Administered 2015-09-28 (×4): 0.5 mg via INTRAVENOUS

## 2015-09-28 MED ORDER — BUPIVACAINE-EPINEPHRINE 0.25% -1:200000 IJ SOLN
INTRAMUSCULAR | Status: DC | PRN
  Administered 2015-09-28: 10 mL

## 2015-09-28 MED ORDER — BUPIVACAINE LIPOSOME 1.3 % IJ SUSP
20.0000 mL | Freq: Once | INTRAMUSCULAR | Status: AC
  Administered 2015-09-28: 20 mL
  Filled 2015-09-28: qty 20

## 2015-09-28 MED ORDER — CEFAZOLIN SODIUM-DEXTROSE 2-3 GM-% IV SOLR
2.0000 g | INTRAVENOUS | Status: AC
  Administered 2015-09-28: 2 g via INTRAVENOUS

## 2015-09-28 MED ORDER — FENTANYL CITRATE (PF) 250 MCG/5ML IJ SOLN
INTRAMUSCULAR | Status: DC | PRN
  Administered 2015-09-28: 150 ug via INTRAVENOUS
  Administered 2015-09-28: 100 ug via INTRAVENOUS

## 2015-09-28 MED ORDER — LIDOCAINE HCL (CARDIAC) 20 MG/ML IV SOLN
INTRAVENOUS | Status: AC
  Filled 2015-09-28: qty 5

## 2015-09-28 MED ORDER — MIDAZOLAM HCL 2 MG/2ML IJ SOLN
INTRAMUSCULAR | Status: AC
  Filled 2015-09-28: qty 2

## 2015-09-28 MED ORDER — 0.9 % SODIUM CHLORIDE (POUR BTL) OPTIME
TOPICAL | Status: DC | PRN
  Administered 2015-09-28: 1000 mL

## 2015-09-28 MED ORDER — SUGAMMADEX SODIUM 200 MG/2ML IV SOLN
INTRAVENOUS | Status: DC | PRN
  Administered 2015-09-28: 200 mg via INTRAVENOUS

## 2015-09-28 MED ORDER — BUPIVACAINE-EPINEPHRINE 0.25% -1:200000 IJ SOLN
INTRAMUSCULAR | Status: AC
  Filled 2015-09-28: qty 1

## 2015-09-28 MED ORDER — CHLORHEXIDINE GLUCONATE 4 % EX LIQD: 1.0000 "application " | Freq: Once | CUTANEOUS | Status: DC

## 2015-09-28 MED ORDER — ACETAMINOPHEN 10 MG/ML IV SOLN
INTRAVENOUS | Status: AC
  Filled 2015-09-28: qty 100

## 2015-09-28 MED ORDER — SODIUM CHLORIDE 0.9 % IJ SOLN
INTRAMUSCULAR | Status: AC
  Filled 2015-09-28: qty 50

## 2015-09-28 MED ORDER — ONDANSETRON HCL 4 MG/2ML IJ SOLN
INTRAMUSCULAR | Status: DC | PRN
  Administered 2015-09-28: 4 mg via INTRAVENOUS

## 2015-09-28 MED ORDER — SODIUM CHLORIDE 0.9 % IJ SOLN
INTRAMUSCULAR | Status: DC | PRN
  Administered 2015-09-28: 40 mL

## 2015-09-28 MED ORDER — LACTATED RINGERS IV SOLN: INTRAVENOUS | Status: DC

## 2015-09-28 MED ORDER — FENTANYL CITRATE (PF) 250 MCG/5ML IJ SOLN
INTRAMUSCULAR | Status: AC
  Filled 2015-09-28: qty 5

## 2015-09-28 MED ORDER — PROPOFOL 10 MG/ML IV BOLUS
INTRAVENOUS | Status: DC | PRN
Start: 2015-09-28 — End: 2015-09-28
  Administered 2015-09-28: 200 mg via INTRAVENOUS

## 2015-09-28 MED ORDER — HYDROMORPHONE HCL 1 MG/ML IJ SOLN
INTRAMUSCULAR | Status: AC
  Filled 2015-09-28: qty 1

## 2015-09-28 MED ORDER — OXYCODONE HCL 5 MG PO TABS: 5.0000 mg | ORAL_TABLET | ORAL | Status: DC | PRN

## 2015-09-28 MED ORDER — ONDANSETRON HCL 4 MG/2ML IJ SOLN
INTRAMUSCULAR | Status: AC
  Filled 2015-09-28: qty 2

## 2015-09-28 MED ORDER — PHENYLEPHRINE HCL 10 MG/ML IJ SOLN
INTRAMUSCULAR | Status: DC | PRN
  Administered 2015-09-28 (×2): 80 ug via INTRAVENOUS
  Administered 2015-09-28 (×2): 40 ug via INTRAVENOUS
  Administered 2015-09-28: 80 ug via INTRAVENOUS

## 2015-09-28 MED ORDER — LACTATED RINGERS IV SOLN
INTRAVENOUS | Status: DC | PRN
  Administered 2015-09-28 (×2): via INTRAVENOUS

## 2015-09-28 MED ORDER — PHENYLEPHRINE 40 MCG/ML (10ML) SYRINGE FOR IV PUSH (FOR BLOOD PRESSURE SUPPORT)
PREFILLED_SYRINGE | INTRAVENOUS | Status: AC
  Filled 2015-09-28: qty 10

## 2015-09-28 MED ORDER — OXYCODONE HCL 5 MG PO TABS
5.0000 mg | ORAL_TABLET | ORAL | Status: DC | PRN
  Administered 2015-09-28: 5 mg via ORAL
  Filled 2015-09-28: qty 1

## 2015-09-28 MED ORDER — ACETAMINOPHEN 10 MG/ML IV SOLN
1000.0000 mg | Freq: Once | INTRAVENOUS | Status: AC
  Administered 2015-09-28: 1000 mg via INTRAVENOUS

## 2015-09-28 MED ORDER — MIDAZOLAM HCL 5 MG/5ML IJ SOLN
INTRAMUSCULAR | Status: DC | PRN
  Administered 2015-09-28: 2 mg via INTRAVENOUS

## 2015-09-28 MED ORDER — PROPOFOL 10 MG/ML IV BOLUS
INTRAVENOUS | Status: AC
  Filled 2015-09-28: qty 20

## 2015-09-28 MED ORDER — LIDOCAINE HCL (CARDIAC) 20 MG/ML IV SOLN
INTRAVENOUS | Status: DC | PRN
  Administered 2015-09-28: 50 mg via INTRAVENOUS

## 2015-09-28 SURGICAL SUPPLY — 41 items
ADH SKN CLS APL DERMABOND .7 (GAUZE/BANDAGES/DRESSINGS) ×1
APL SKNCLS STERI-STRIP NONHPOA (GAUZE/BANDAGES/DRESSINGS)
BANDAGE ADH SHEER 1  50/CT (GAUZE/BANDAGES/DRESSINGS) IMPLANT
BENZOIN TINCTURE PRP APPL 2/3 (GAUZE/BANDAGES/DRESSINGS) IMPLANT
CHLORAPREP W/TINT 26ML (MISCELLANEOUS) ×2 IMPLANT
COVER SURGICAL LIGHT HANDLE (MISCELLANEOUS) ×2 IMPLANT
DECANTER SPIKE VIAL GLASS SM (MISCELLANEOUS) ×2 IMPLANT
DERMABOND ADVANCED (GAUZE/BANDAGES/DRESSINGS) ×1
DERMABOND ADVANCED .7 DNX12 (GAUZE/BANDAGES/DRESSINGS) IMPLANT
DEVICE SECURE STRAP 25 ABSORB (INSTRUMENTS) IMPLANT
DRAPE LAPAROSCOPIC ABDOMINAL (DRAPES) ×2 IMPLANT
DRSG TEGADERM 2-3/8X2-3/4 SM (GAUZE/BANDAGES/DRESSINGS) IMPLANT
DRSG TEGADERM 4X4.75 (GAUZE/BANDAGES/DRESSINGS) ×1 IMPLANT
ELECT REM PT RETURN 9FT ADLT (ELECTROSURGICAL) ×2
ELECTRODE REM PT RTRN 9FT ADLT (ELECTROSURGICAL) ×1 IMPLANT
GLOVE BIOGEL M STRL SZ7.5 (GLOVE) ×1 IMPLANT
GOWN STRL REUS W/TWL XL LVL3 (GOWN DISPOSABLE) ×6 IMPLANT
KIT BASIN OR (CUSTOM PROCEDURE TRAY) ×2 IMPLANT
MESH 3DMAX 4X6 LT LRG (Mesh General) ×1 IMPLANT
MESH 3DMAX 4X6 RT LRG (Mesh General) ×1 IMPLANT
NS IRRIG 1000ML POUR BTL (IV SOLUTION) ×2 IMPLANT
RELOAD STAPLE 4.0 BLU F/HERNIA (INSTRUMENTS) IMPLANT
RELOAD STAPLE 4.8 BLK F/HERNIA (STAPLE) IMPLANT
RELOAD STAPLE HERNIA 4.0 BLUE (INSTRUMENTS) IMPLANT
RELOAD STAPLE HERNIA 4.8 BLK (STAPLE) IMPLANT
SCALPEL HARMONIC ACE (MISCELLANEOUS) IMPLANT
SCISSORS LAP 5X35 DISP (ENDOMECHANICALS) ×2 IMPLANT
SET IRRIG TUBING LAPAROSCOPIC (IRRIGATION / IRRIGATOR) IMPLANT
SOLUTION ANTI FOG 6CC (MISCELLANEOUS) ×2 IMPLANT
STAPLER HERNIA 12 8.5 360D (INSTRUMENTS) ×2 IMPLANT
STRIP CLOSURE SKIN 1/2X4 (GAUZE/BANDAGES/DRESSINGS) IMPLANT
SUT MNCRL AB 4-0 PS2 18 (SUTURE) ×2 IMPLANT
SUT VICRYL 0 UR6 27IN ABS (SUTURE) ×1 IMPLANT
TOWEL OR 17X26 10 PK STRL BLUE (TOWEL DISPOSABLE) ×2 IMPLANT
TOWEL OR NON WOVEN STRL DISP B (DISPOSABLE) ×2 IMPLANT
TRAY FOLEY W/METER SILVER 16FR (SET/KITS/TRAYS/PACK) ×2 IMPLANT
TRAY LAPAROSCOPIC (CUSTOM PROCEDURE TRAY) ×2 IMPLANT
TROCAR BLADELESS OPT 5 75 (ENDOMECHANICALS) ×3 IMPLANT
TROCAR SLEEVE XCEL 5X75 (ENDOMECHANICALS) ×2 IMPLANT
TROCAR XCEL BLUNT TIP 100MML (ENDOMECHANICALS) ×2 IMPLANT
TUBING INSUFFLATION 10FT LAP (TUBING) ×2 IMPLANT

## 2015-09-28 NOTE — H&P (Signed)
[name] [name] [facility] 08/31/2015 2:29 PM Location: [facility] Patient #: [id number] DOB: 05-Feb-1956 Single / Language: [place] / Race: White Male   History of Present Illness [name] Areola M. Cesare Sumlin MD; 08/31/2015 3:14 PM) Patient words: hernia.  The patient is a 59 year old male who presents with an inguinal hernia. He is referred by Dr [name] for evaluation of bilateral inguinal hernias. He states a few weeks ago he noticed well in the shower a large lump in his left groin. He is also noticed a lump in his right groin however the left side is larger. He reports constant pressure in his left groin. He has also had a little bit of burning on the left side. He denies any nausea, vomiting, diarrhea or constipation. He does get up to urinate in in the night. He does report that he sometimes doesn't feel like he completely empties his bladder. He denies any dysuria. He denies any tobacco use. He denies any chest pain, chest pressure, shows a breath, dyspnea on exertion. 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  Diagnostic Studies History Deon Pilling, LPN; 19/14/7829 2:29 PM) Colonoscopy 1-5 years ago  Allergies (Ammie Eversole, LPN; 56/21/3086 2:31 PM) No Known Drug Allergies11/17/2016  Medication History [name] Ina, MD; 08/31/2015 3:17 PM) Simvastatin (  Tablet, Oral) Active. Valsartan (  Tablet, Oral) Active. Medications Reconciled Flomax (0.4MG  Capsule, 1 (one) Capsule Oral daily, Taken starting 08/31/2015) Active. (start taking 2 weeks before surgery including morning of surgery)  Social History (Ammie  Eversole, LPN; 57/84/6962 2:29 PM) Alcohol use Occasional alcohol use. No caffeine use No drug use Tobacco use Never smoker.  Family History Deon Pilling, LPN; 95/28/4132 2:29 PM) Colon Cancer Mother. Heart Disease Father. Heart disease in male family member before age 40    Review of Systems (Ammie Eversole LPN; 44/10/270 2:29 PM) General Not Present- Appetite Loss, Chills, Fatigue, Fever, Night Sweats, Weight Gain and Weight Loss. Skin Not Present- Change in Wart/Mole, Dryness, Hives, Jaundice, New Lesions, Non-Healing Wounds, Rash and Ulcer. HEENT Not Present- Earache, Hearing Loss, Hoarseness, Nose Bleed, Oral Ulcers, Ringing in the Ears, Seasonal Allergies, Sinus Pain, Sore Throat, Visual Disturbances, Wears glasses/contact lenses and Yellow Eyes. Respiratory Not Present- Bloody sputum, Chronic Cough, Difficulty Breathing, Snoring and Wheezing. Breast Not Present- Breast Mass, Breast Pain, Nipple Discharge and Skin Changes. Cardiovascular Present- Leg Cramps. Not Present- Chest Pain, Difficulty Breathing Lying Down, Palpitations, Rapid Heart Rate, Shortness of Breath and Swelling of Extremities. Gastrointestinal Present- Abdominal Pain. Not Present- Bloating, Bloody Stool, Change in Bowel Habits, Chronic diarrhea, Constipation, Difficulty Swallowing, Excessive gas, Gets full quickly at meals, Hemorrhoids, Indigestion, Nausea, Rectal Pain and Vomiting. Male Genitourinary Present- Frequency and Urgency. Not Present- Blood in Urine, Change in Urinary Stream, Impotence, Nocturia, Painful Urination and Urine Leakage.  Vitals (Ammie Eversole LPN; 53/66/4403 2:30 PM) 08/31/2015 2:30 PM Weight: 164 lb Height: 69in Body Surface Area: 1.9 m Body Mass Index: 24.22 kg/m  Temp.: 98.30F(Oral)  Pulse: 87 (Regular)  BP: 142/82 (Sitting, Left Arm, Standard)       Physical Exam [name] Areola M. Cynia Abruzzo MD; 08/31/2015 3:12 PM) General Mental Status-Alert. General  Appearance-Consistent with stated age. Hydration-Well hydrated. Voice-Normal.  Head and Neck  Head-normocephalic, atraumatic with no lesions or palpable masses. Trachea-midline. Thyroid Gland Characteristics - normal size and consistency.  Eye Eyeball - Bilateral-Extraocular movements intact. Sclera/Conjunctiva - Bilateral-No scleral icterus.  Chest and Lung Exam Chest and lung exam reveals -quiet, even and easy respiratory effort with no use of accessory muscles and on auscultation, normal breath sounds, no adventitious sounds and normal vocal resonance. Inspection Chest Wall - Normal. Back - normal.  Breast - Did not examine.  Cardiovascular Cardiovascular examination reveals -normal heart sounds, regular rate and rhythm with no murmurs and normal pedal pulses bilaterally.  Abdomen Inspection Inspection of the abdomen reveals - No Hernias. Skin - Scar - no surgical scars. Palpation/Percussion Palpation and Percussion of the abdomen reveal - Soft, Non Tender, No Rebound tenderness, No Rigidity (guarding) and No hepatosplenomegaly. Auscultation Auscultation of the abdomen reveals - Bowel sounds normal.  Male Genitourinary Note: pt examined supine and standing. no testicular/scrotal masses. obvious large left inguinal hernia/bulge, non tender, not really reducible; smaller reducible RIH.   Peripheral Vascular Upper Extremity Palpation - Pulses bilaterally normal.  Neurologic Neurologic evaluation reveals -alert and oriented x 3 with no impairment of recent or remote memory. Mental Status-Normal.  Neuropsychiatric The patient's mood and affect are described as -normal. Judgment and Insight-insight is appropriate concerning matters relevant to self.  Musculoskeletal Normal Exam - Left-Upper Extremity Strength Normal and Lower Extremity Strength Normal. Normal Exam - Right-Upper Extremity Strength Normal and Lower Extremity Strength  Normal.  Lymphatic Head & Neck  General Head & Neck Lymphatics: Bilateral - Description - Normal. Axillary - Did not examine. Femoral & Inguinal - Did not examine.    Assessment & Plan [name] Areola(Shamel Germond M. Imara Standiford MD; 08/31/2015 3:17 PM) INCARCERATED LEFT INGUINAL HERNIA (K40.30) Impression: I really can't reduce the left inguinal hernia. We discussed the etiology of inguinal hernias. We discussed the signs & symptoms of incarceration & strangulation. We discussed non-operative and operative management. I recommended a laparoscopic approach since he has bilateral inguinal hernias  The patient has elected to proceed with laparoscopic repair bilateral inguinal hernia with mesh  We will place him on perioperative Flomax to reduce his postop urinary retention  I described the procedure in detail. The patient was given educational material. We discussed the risks and benefits including but not limited to bleeding, infection, chronic inguinal pain, nerve entrapment, hernia recurrence, mesh complications, hematoma formation, urinary retention, injury to the testicles, numbness in the groin, blood clots, injury to the surrounding structures, and anesthesia risk. We also discussed the typical post operative recovery course, including no heavy lifting for 4-6 weeks. I explained that the likelihood of improvement of their symptoms is good Current Plans Pt Education - Pamphlet Given - Laparoscopic Hernia Repair: discussed with patient and provided information. You are being scheduled for surgery - Our schedulers will call you.  You should hear from our office's scheduling department within 5 working days about the location, date, and time of surgery. We try to make accommodations for patient's preferences in scheduling surgery, but sometimes the OR schedule or the surgeon's schedule prevents us from making those accommodations.  If you have not heard from our office [phone]) in 5 working days, call the  office and ask for your surgeon's nurse.  If you have other questions about your diagnosis, plan, or surgery, call the office and ask for your surgeon's nurse.  Started Flomax 0.4MG , 1 (one) Capsule daily, #21, 21 days starting 08/31/2015, No Refill. Local Order: start taking 2 weeks before surgery including morning  of surgery RIGHT INGUINAL HERNIA (K40.90)    Signed by [name] Ina, MD (08/31/2015 3:18 PM)  Mary Sella. Andrey Campanile, MD, FACS General, Bariatric, & Minimally Invasive Surgery [facility], [place]

## 2015-09-28 NOTE — Interval H&P Note (Signed)
History and Physical Interval Note:  09/28/2015 1:13 PM  [name] [name]  has presented today for surgery, with the diagnosis of bilateral inguinal hernia  The various methods of treatment have been discussed with the patient and family. After consideration of risks, benefits and other options for treatment, the patient has consented to  Procedure(s): LAPAROSCOPIC BILATERAL INGUINAL HERNIA REPAIR (Bilateral) INSERTION OF MESH (Bilateral) as a surgical intervention .  The patient's history has been reviewed, patient examined, no change in status, stable for surgery.  I have reviewed the patient's chart and labs.  Questions were answered to the patient's satisfaction.    Mary SellaEric [name]. Andrey CampanileWilson, MD, FACS General, Bariatric, & Minimally Invasive Surgery [facility], [place]    [name]

## 2015-09-28 NOTE — Discharge Instructions (Signed)
[facility]  UMBILICAL OR INGUINAL HERNIA REPAIR: POST OP INSTRUCTIONS  Always review your discharge instruction sheet given to you by the facility where your surgery was performed. IF YOU HAVE DISABILITY OR FAMILY LEAVE FORMS, YOU MUST BRING THEM TO THE OFFICE FOR PROCESSING.   DO NOT GIVE THEM TO YOUR DOCTOR.  1. A  prescription for pain medication may be given to you upon discharge.  Take your pain medication as prescribed, if needed.  If narcotic pain medicine is not needed, then you may take acetaminophen (Tylenol) or ibuprofen (Advil) as needed. 2. Take your usually prescribed medications unless otherwise directed. 3. If you need a refill on your pain medication, please contact your pharmacy.  They will contact our office to request authorization. Prescriptions will not be filled after 5 pm or on week-ends. 4. You should follow a light diet the first 24 hours after arrival home, such as soup and crackers, etc.  Be sure to include lots of fluids daily.  Resume your normal diet the day after surgery. 5. Most patients will experience some swelling and bruising around the umbilicus or in the groin and scrotum.  Ice packs and reclining will help.  Swelling and bruising can take several days to resolve.  6. It is common to experience some constipation if taking pain medication after surgery.  Increasing fluid intake and taking a stool softener (such as Colace) will usually help or prevent this problem from occurring.  A mild laxative (Milk of Magnesia or Miralax) should be taken according to package directions if there are no bowel movements after 48 hours. 7. If your surgeon used skin glue on the incision, you may shower in 24 hours.  The glue will flake off over the next 2-3 weeks.  Any sutures or staples will be removed at the office during your follow-up visit. 8. ACTIVITIES:  You may resume regular (light) daily activities beginning the next day--such as daily self-care,  walking, climbing stairs--gradually increasing activities as tolerated.  You may have sexual intercourse when it is comfortable.  Refrain from any heavy lifting or straining until approved by your doctor. a. You may drive when you are no longer taking prescription pain medication, you can comfortably wear a seatbelt, and you can safely maneuver your car and apply brakes. b. RETURN TO WORK:  9. You should see your doctor in the office for a follow-up appointment approximately 2-3 weeks after your surgery.  Make sure that you call for this appointment within a day or two after you arrive home to insure a convenient appointment time. 10. OTHER INSTRUCTIONS: Do not lift, push, or pull anything greater than 10 pounds for 6 weeks.    WHEN TO CALL YOUR DOCTOR: 1. Fever over 101.0 2. Inability to urinate 3. Nausea and/or vomiting 4. Extreme swelling or bruising 5. Continued bleeding from incision. 6. Increased pain, redness, or drainage from the incision  The clinic staff is available to answer your questions during regular business hours.  Please dont hesitate to call and ask to speak to one of the nurses for clinical concerns.  If you have a medical emergency, go to the nearest emergency room or call 911.  A surgeon from [facility] is always on call at the hospital   [address], [address], [place], [place]  [address] ?  [address] [address], [place], [place]   [address] [phone] ? [phone] ? FAX [phone] Web site: [email or web address]

## 2015-09-28 NOTE — Op Note (Signed)
09/28/2015  [name] [name] 02-17-1956   PREOPERATIVE DIAGNOSIS: bilateral inguinal hernia.   POSTOPERATIVE DIAGNOSIS: bilateral indirect inguinal hernias  PROCEDURE: Laparoscopic repair of bilateral indirect inguinal hernias with  mesh (TAPP).   SURGEON: Mary Sella. Andrey Campanile, MD   ASSISTANT SURGEON: None.   ANESTHESIA: General plus local consisting of 0.25% Marcaine with epi and 60cc exparel  ESTIMATED BLOOD LOSS: Minimal.   FINDINGS: The patient had bilateral indirect inguinal hernias.  It was repaired using two LARGE BARD 3D Max pieces of mesh.   SPECIMEN: none  INDICATIONS FOR PROCEDURE: 59 year old Caucasian male presented with a very symptomatic left inguinal hernia as well as a right inguinal hernia. Because he had bilateral inguinal hernias we recommended a laparoscopic approach. Please see additional discussion in my H&P. The risks and benefits including but not limited to bleeding, infection, chronic inguinal pain, nerve entrapment, hernia recurrence, mesh complications, hematoma formation, urinary retention, injury to the testicles or the ovaries, numbness in the groin, blood clots, injury to the surrounding structures, and anesthesia risk was discussed with the patient.  DESCRIPTION OF PROCEDURE: After obtaining verbal consent, the patient was then taken back to the operating room, placed  supine on the operating room table. General endotracheal anesthesia was  established. The patient had emptied their bladder prior to going back to  the operating room. Sequential compression devices were placed. The  abdomen and groin were prepped and draped in the usual standard surgical  fashion with ChloraPrep. The patient received IV antibiotics prior to the incision. A surgical time-out was performed.  Local was infiltrated at the base of the umbilicus.   Next, a 1-cm vertical infraumbilical incision was made with a #11 blade. The fascia  was grasped and lifted anteriorly. Next, the  fascia was incised, and  the abdominal cavity was entered. Pursestring suture was placed around  the fascial edges using a 0 Vicryl. A 12-mm Hasson trocar was placed.  Pneumoperitoneum was smoothly established up to a patient pressure of 15  mmHg. Laparoscope was advanced. There was evidence of bilateral indirect inguinal hernias. The patient had a defects lateral to  the inferior epigastric vessel, consistent with an bilateral indirect  hernias. Unfortunately his bladder was distended. I felt the safest thing to do was to have the nurse catheterize him to decompress the bladder. This is done in a sterile technique. Two 5-mm trocars were placed, one on the right, one on the left  in the midclavicular line slightly above the level of the umbilicus all  under direct visualization. After local had been infiltrated, I then  made incision along the peritoneum on the right, starting 2 inches above  the anterior superior iliac spine and caring it medial  toward the median umbilical ligament in a lazy S configuration using  Endo Shears with electrocautery. The peritoneal flap was then gently  dissected downward from the anterior abdominal wall taking care not to  injure the inferior epigastric vessels. The pubic bone was identified.  The testicular vessels were identified.  Using  traction and counter traction with short graspers, I reduced the sac in  its entirety. The testicular vessels had been identified and preserved. The vas deferens was identified and preserved, and the hernia sac was stripped from those to  surrounding structures. I then went about creating a large pocket by  lifting the peritoneum of the pelvic floor. I took great care not to  injure the iliac vessels. A small rent had been made in the peritoneal flap.  Exparel anesthetic was injected 2 finger breadths below and medial to the anterior superior iliac spine as well as along the right groin prior to placing the mesh.  I then  turned my attention to reducing and dissecting the left groin.  I then  made incision along the peritoneum on the right, starting 2 inches above  the anterior superior iliac spine and caring it medial  toward the median umbilical ligament in a lazy S configuration using  Endo Shears with electrocautery. The peritoneal flap was then gently  dissected downward from the anterior abdominal wall taking care not to  injure the inferior epigastric vessels. The pubic bone was identified.  The testicular vessels were identified.  Using  traction and counter traction with short graspers, I reduced the sac in  its entirety. A large pocket was then created by mobilizing the peritoneum off the pelvic floor. This was done in a similar fashion to have been done on the right side. I then infiltrated express only in a regional fashion in a similar fashion on the left side.  I then obtained a LARGE piece of Left Bard 3D max mesh & placed it through the Hasson trocar and placed it in the left groin. Half of it covered medial  to the inferior epigastric vessels and half of it lateral to the  inferior epigastric vessels. The defect was well  covered with the mesh. I then secured the mesh to the abdominal wall  using an Ethicon secure strap tack. Tacks were placed through  the Cooper's ligament, one tack on each side of the inferior epigastric  vessel and 1 tack out laterally. No tacks were placed below the  shelving edge of the inguinal ligament.   I then went to the right side and placed a large piece of right Bard 3-D max mesh into the abdomen and placed it into the right groin. The mesh was placed at that half of it covered medial to the inferior epigastric and the other half lateral to the inferior epigastric vessels. The indirect defect was well covered. I then secured the mesh in a similar fashion using the Ethicon secure strap tacker. Tacks were placed along the medial aspect of the mesh one on each side of  the inferior epigastric vessel, 1 out laterally. No tacks were placed below the shelving edge of the inguinal ligament.  Pneumoperitoneum was reduced to 8 mmHg. I then brought the peritoneal flaps on each side back up to the abdominal  wall and tacked it to the abdominal wall using 4 tacks on the left and 5 tacks on the right. The fifth tack on the right was used to secure the redundant peritoneum that had the defect in it to the anterior abdominal wall. There was no  defect in the peritoneum, and the mesh was well covered. I removed the  Hasson trocar and tied down the previously placed pursestring suture.  The closure was viewed laparoscopically. There was no evidence of  fascial defect. There was an air leak at the umbilicus. Therefore I placed an additional interrupted 0 Vicryl suture. At this point there is no evidence of air leak or palpable fascial defects. There was no  evidence of injury to surrounding structures. Pneumoperitoneum was  released, and the remaining trocars were removed. All skin incisions  were closed with a 4-0 Monocryl in a subcuticular fashion followed by  application of Dermabond. All needle, instrument, and sponge counts  were correct x2. There are no immediate complications.  The patient  tolerated the procedure well. The patient was extubated and taken to the  recovery room in stable condition.  Mary SellaEric M. Andrey CampanileWilson, MD, FACS General, Bariatric, & Minimally Invasive Surgery [facility], [place]

## 2015-09-28 NOTE — Transfer of Care (Signed)
Immediate Anesthesia Transfer of Care Note  Patient: [name] [name]  Procedure(s) Performed: Procedure(s): LAPAROSCOPIC BILATERAL INGUINAL HERNIA REPAIR (Bilateral) INSERTION OF MESH (Bilateral)  Patient Location: PACU  Anesthesia Type:General  Level of Consciousness: awake, alert , oriented, patient cooperative and responds to stimulation  Airway & Oxygen Therapy: Patient Spontanous Breathing and Patient connected to face mask oxygen  Post-op Assessment: Report given to RN, Post -op Vital signs reviewed and stable and Patient moving all extremities  Post vital signs: Reviewed and stable  Last Vitals:  Filed Vitals:   09/28/15 1042  BP: 132/86  Pulse: 88  Temp: 36.7 C  Resp: 18    Complications: No apparent anesthesia complications

## 2015-09-28 NOTE — Anesthesia Postprocedure Evaluation (Signed)
Anesthesia Post Note  Patient: Dorinda Hillonald Baisley  Procedure(s) Performed: Procedure(s) (LRB): LAPAROSCOPIC BILATERAL INGUINAL HERNIA REPAIR (Bilateral) INSERTION OF MESH (Bilateral)  Patient location during evaluation: PACU Anesthesia Type: General Level of consciousness: awake and alert Pain management: pain level controlled Vital Signs Assessment: post-procedure vital signs reviewed and stable Respiratory status: spontaneous breathing, nonlabored ventilation, respiratory function stable and patient connected to nasal cannula oxygen Cardiovascular status: blood pressure returned to baseline and stable Postop Assessment: no signs of nausea or vomiting Anesthetic complications: no    Last Vitals:  Filed Vitals:   09/28/15 1630 09/28/15 1642  BP: 135/81 136/74  Pulse: 90 91  Temp: 36.8 C 36.8 C  Resp: 13 14    Last Pain:  Filed Vitals:   09/28/15 1654  PainSc: 4                  Dandre Sisler L

## 2015-09-28 NOTE — Anesthesia Procedure Notes (Signed)
Procedure Name: Intubation Date/Time: 09/28/2015 1:41 PM Performed by: Izora GalaHUGHES, Maite Burlison A Pre-anesthesia Checklist: Timeout performed, Patient being monitored, Suction available, Emergency Drugs available and Patient identified Patient Re-evaluated:Patient Re-evaluated prior to inductionOxygen Delivery Method: Circle system utilized Preoxygenation: Pre-oxygenation with 100% oxygen Intubation Type: IV induction Ventilation: Mask ventilation without difficulty Laryngoscope Size: Mac and 3 Grade View: Grade I Tube size: 7.0 mm Number of attempts: 1 Airway Equipment and Method: Stylet Placement Confirmation: ETT inserted through vocal cords under direct vision,  positive ETCO2 and breath sounds checked- equal and bilateral Secured at: 22 cm Tube secured with: Tape Dental Injury: Teeth and Oropharynx as per pre-operative assessment

## 2015-09-28 NOTE — Anesthesia Preprocedure Evaluation (Addendum)
Anesthesia Evaluation  Patient identified by MRN, date of birth, ID band Patient awake    Reviewed: Allergy & Precautions, H&P , NPO status , Patient's Chart, lab work & pertinent test results  Airway Mallampati: II  TM Distance: >3 FB Neck ROM: full    Dental no notable dental hx. (+) Dental Advisory Given, Caps Upper 2 front teeth capped:   Pulmonary neg pulmonary ROS,    Pulmonary exam normal breath sounds clear to auscultation       Cardiovascular Exercise Tolerance: Good hypertension, Pt. on medications Normal cardiovascular exam Rhythm:regular Rate:Normal     Neuro/Psych negative neurological ROS  negative psych ROS   GI/Hepatic negative GI ROS, Neg liver ROS,   Endo/Other  negative endocrine ROS  Renal/GU negative Renal ROS  negative genitourinary   Musculoskeletal   Abdominal   Peds  Hematology negative hematology ROS (+)   Anesthesia Other Findings   Reproductive/Obstetrics negative OB ROS                            Anesthesia Physical Anesthesia Plan  ASA: II  Anesthesia Plan: General   Post-op Pain Management:    Induction: Intravenous  Airway Management Planned: Oral ETT  Additional Equipment:   Intra-op Plan:   Post-operative Plan: Extubation in OR  Informed Consent: I have reviewed the patients History and Physical, chart, labs and discussed the procedure including the risks, benefits and alternatives for the proposed anesthesia with the patient or authorized representative who has indicated his/her understanding and acceptance.   Dental Advisory Given  Plan Discussed with: CRNA and Surgeon  Anesthesia Plan Comments:         Anesthesia Quick Evaluation

## 2015-09-29 ENCOUNTER — Encounter (HOSPITAL_COMMUNITY): Payer: Self-pay | Admitting: General Surgery

## 2015-11-02 ENCOUNTER — Ambulatory Visit: Payer: Self-pay | Admitting: Internal Medicine

## 2016-01-07 ENCOUNTER — Other Ambulatory Visit: Payer: Self-pay | Admitting: Internal Medicine

## 2016-01-07 DIAGNOSIS — I10 Essential (primary) hypertension: Secondary | ICD-10-CM

## 2016-01-31 ENCOUNTER — Ambulatory Visit (INDEPENDENT_AMBULATORY_CARE_PROVIDER_SITE_OTHER): Payer: BLUE CROSS/BLUE SHIELD | Admitting: Internal Medicine

## 2016-01-31 ENCOUNTER — Encounter: Payer: Self-pay | Admitting: Internal Medicine

## 2016-01-31 VITALS — BP 118/74 | HR 72 | Temp 98.0°F | Resp 16 | Ht 68.25 in | Wt 166.0 lb

## 2016-01-31 DIAGNOSIS — A084 Viral intestinal infection, unspecified: Secondary | ICD-10-CM | POA: Diagnosis not present

## 2016-01-31 DIAGNOSIS — I10 Essential (primary) hypertension: Secondary | ICD-10-CM

## 2016-01-31 MED ORDER — ONDANSETRON 8 MG PO TBDP
ORAL_TABLET | ORAL | Status: DC
Start: 2016-01-31 — End: 2016-05-01

## 2016-01-31 MED ORDER — VALSARTAN 80 MG PO TABS: ORAL_TABLET | ORAL | Status: DC

## 2016-01-31 MED ORDER — DICYCLOMINE HCL 20 MG PO TABS
20.0000 mg | ORAL_TABLET | Freq: Three times a day (TID) | ORAL | Status: DC | PRN
Start: 2016-01-31 — End: 2016-05-01

## 2016-01-31 MED ORDER — LOPERAMIDE HCL 2 MG PO CAPS: ORAL_CAPSULE | ORAL | Status: DC

## 2016-01-31 NOTE — Progress Notes (Signed)
   Subjective:    Patient ID: [name] [name], [name] [name]    DOB: 04/04/1956, 60 y.o.   MRN: [id number]  Emesis  This is a new problem. The current episode started yesterday. Associated symptoms include diarrhea and dizziness. Pertinent negatives include no abdominal pain, chest pain, chills or fever.  Diarrhea  Associated symptoms include vomiting. Pertinent negatives include no abdominal pain, chills or fever.   Patient reports that 2 days ago he developed watery diarrhea.  He reports that he has been having stools that are upwards of 10 times a day.  No blood or black colored stools.  He has also been having some nauseated feelings and has vomited twice.  He reports that he feels like his head is cloudy.  He did take some advil.  He does not have any stomach pains currently.  He had some pain in the lower abdomen when he played golf a couple days ago.  He has not done much more activity.  No sick contacts that he is aware.  His grandkids had something similar that has been going around their schools.      Review of Systems  Constitutional: Negative for fever, chills and fatigue.  Respiratory: Negative for chest tightness and shortness of breath.   Cardiovascular: Negative for chest pain and palpitations.  Gastrointestinal: Positive for nausea, vomiting and diarrhea. Negative for abdominal pain, constipation and blood in stool.  Neurological: Positive for dizziness and light-headedness.  Psychiatric/Behavioral: Negative for suicidal ideas, decreased concentration and agitation. The patient is not nervous/anxious.        Objective:   Physical Exam  Constitutional: He is oriented to person, place, and time. He appears well-developed and well-nourished. No distress.  HENT:  Head: Normocephalic.  Mouth/Throat: Oropharynx is clear and moist. No oropharyngeal exudate.  Eyes: Conjunctivae are normal. No scleral icterus.  Neck: Normal range of motion. Neck supple. No JVD present. No thyromegaly  present.  Cardiovascular: Normal rate, regular rhythm, normal heart sounds and intact distal pulses.  Exam reveals no gallop and no friction rub.   No murmur heard. Pulmonary/Chest: Effort normal and breath sounds normal. No respiratory distress. He has no wheezes. He has no rales. He exhibits no tenderness.  Abdominal: Soft. Bowel sounds are normal. He exhibits no distension and no mass. There is no tenderness. There is no rebound and no guarding.  Musculoskeletal: Normal range of motion.  Lymphadenopathy:    He has no cervical adenopathy.  Neurological: He is alert and oriented to person, place, and time.  Skin: Skin is warm and dry. He is not diaphoretic.  Psychiatric: He has a normal mood and affect. His behavior is normal. Judgment and thought content normal.  Nursing note and vitals reviewed.   Filed Vitals:   01/31/16 1340  BP: 118/74  Pulse: 72  Temp: 98 F (36.7 C)  Resp: 16         Assessment & Plan:     1. Viral gastroenteritis -bentyl -zofran -BRAT diet -slowly advance fluids -call if worsening abdominal pain, intractable nausea and vomiting  2. Essential hypertension  - valsartan (DIOVAN) 80 MG tablet; Take 1 tablet daily for BP - due Office Visit before refill  Dispense: 90 tablet; Refill: 1

## 2016-02-05 ENCOUNTER — Other Ambulatory Visit: Payer: Self-pay | Admitting: Internal Medicine

## 2016-04-05 ENCOUNTER — Other Ambulatory Visit: Payer: Self-pay | Admitting: Internal Medicine

## 2016-04-30 ENCOUNTER — Encounter: Payer: Self-pay | Admitting: Internal Medicine

## 2016-05-01 ENCOUNTER — Ambulatory Visit (INDEPENDENT_AMBULATORY_CARE_PROVIDER_SITE_OTHER): Payer: BLUE CROSS/BLUE SHIELD | Admitting: Internal Medicine

## 2016-05-01 ENCOUNTER — Encounter: Payer: Self-pay | Admitting: Internal Medicine

## 2016-05-01 VITALS — BP 112/62 | HR 96 | Temp 98.0°F | Resp 16 | Ht 68.0 in | Wt 170.0 lb

## 2016-05-01 DIAGNOSIS — I10 Essential (primary) hypertension: Secondary | ICD-10-CM

## 2016-05-01 DIAGNOSIS — Z1389 Encounter for screening for other disorder: Secondary | ICD-10-CM

## 2016-05-01 DIAGNOSIS — E785 Hyperlipidemia, unspecified: Secondary | ICD-10-CM

## 2016-05-01 DIAGNOSIS — Z125 Encounter for screening for malignant neoplasm of prostate: Secondary | ICD-10-CM | POA: Diagnosis not present

## 2016-05-01 DIAGNOSIS — Z0000 Encounter for general adult medical examination without abnormal findings: Secondary | ICD-10-CM

## 2016-05-01 DIAGNOSIS — Z79899 Other long term (current) drug therapy: Secondary | ICD-10-CM

## 2016-05-01 DIAGNOSIS — Z1329 Encounter for screening for other suspected endocrine disorder: Secondary | ICD-10-CM

## 2016-05-01 DIAGNOSIS — Z131 Encounter for screening for diabetes mellitus: Secondary | ICD-10-CM

## 2016-05-01 DIAGNOSIS — E559 Vitamin D deficiency, unspecified: Secondary | ICD-10-CM

## 2016-05-01 DIAGNOSIS — Z130 Encounter for screening for diseases of the blood and blood-forming organs and certain disorders involving the immune mechanism: Secondary | ICD-10-CM

## 2016-05-01 DIAGNOSIS — R011 Cardiac murmur, unspecified: Secondary | ICD-10-CM

## 2016-05-01 LAB — URINALYSIS, ROUTINE W REFLEX MICROSCOPIC
BILIRUBIN URINE: NEGATIVE
Glucose, UA: NEGATIVE
HGB URINE DIPSTICK: NEGATIVE
KETONES UR: NEGATIVE
Leukocytes, UA: NEGATIVE
Nitrite: NEGATIVE
PH: 6.5 (ref 5.0–8.0)
Protein, ur: NEGATIVE
SPECIFIC GRAVITY, URINE: 1.02 (ref 1.001–1.035)

## 2016-05-01 LAB — CBC WITH DIFFERENTIAL/PLATELET
BASOS ABS: 89 {cells}/uL (ref 0–200)
Basophils Relative: 1 %
EOS PCT: 4 %
Eosinophils Absolute: 356 cells/uL (ref 15–500)
HCT: 39.2 % (ref 38.5–50.0)
Hemoglobin: 13.2 g/dL (ref 13.2–17.1)
LYMPHS ABS: 2314 {cells}/uL (ref 850–3900)
LYMPHS PCT: 26 %
MCH: 29.6 pg (ref 27.0–33.0)
MCHC: 33.7 g/dL (ref 32.0–36.0)
MCV: 87.9 fL (ref 80.0–100.0)
MONOS PCT: 7 %
MPV: 10.1 fL (ref 7.5–12.5)
Monocytes Absolute: 623 cells/uL (ref 200–950)
NEUTROS PCT: 62 %
Neutro Abs: 5518 cells/uL (ref 1500–7800)
PLATELETS: 249 10*3/uL (ref 140–400)
RBC: 4.46 MIL/uL (ref 4.20–5.80)
RDW: 13.9 % (ref 11.0–15.0)
WBC: 8.9 10*3/uL (ref 3.8–10.8)

## 2016-05-01 LAB — VITAMIN B12: Vitamin B-12: 312 pg/mL (ref 200–1100)

## 2016-05-01 LAB — TSH: TSH: 2.2 m[IU]/L (ref 0.40–4.50)

## 2016-05-01 NOTE — Progress Notes (Signed)
Complete Physical  Assessment and Plan:   1. Routine general medical examination at a health care facility  - CBC with Differential/Platelet - BASIC METABOLIC PANEL WITH GFR - Hepatic function panel - Magnesium  2. Essential hypertension, benign -EKG normal -US aorta normal -well controlled -cont meds -monitor at home -dash diet - TSH  3. Hyperlipidemia -cont meds - Lipid panel  4. Heart murmur -unchanged  5. Screening for diabetes mellitus -cont diet and exercise -A1C -insulin level  6. Screening for thyroid disorder -TSH  7. Screening for hematuria or proteinuria  - Urinalysis, Routine w reflex microscopic (not at [facility]) - Microalbumin / creatinine urine ratio  8. Screening for deficiency anemia  - Iron and TIBC - Vitamin B12  9. Vitamin D deficiency -cont supplement - VITAMIN D 25 Hydroxy (Vit-D Deficiency, Fractures)  10. Screening for prostate cancer  - PSA    Discussed med's effects and SE's. Screening labs and tests as requested with regular follow-up as recommended.  HPI Patient presents for a complete physical.   His blood pressure has been controlled at home, today their BP is BP: 112/62 mmHg He does workout. He denies chest pain, shortness of breath, dizziness.    He is on cholesterol medication and denies myalgias. His cholesterol is at goal. The cholesterol last visit was:   Lab Results  Component Value Date   CHOL 131 05/01/2015   HDL 44 05/01/2015   LDLCALC 71 05/01/2015   TRIG 79 05/01/2015   CHOLHDL 3.0 05/01/2015    He has been working on diet and exercise for prediabetes, he is on bASA, he is on ACE/ARB and denies foot ulcerations, hyperglycemia, hypoglycemia , increased appetite, nausea, paresthesia of the feet, polydipsia, polyuria, visual disturbances, vomiting and weight loss. Last A1C in the office was:  Lab Results  Component Value Date   HGBA1C 5.9* 05/01/2015    Patient is on Vitamin D supplement.   Lab Results   Component Value Date   VD25OH 40 05/01/2015      Last PSA was: Lab Results  Component Value Date   PSA 2.32 05/01/2015  .  Denies BPH symptoms daytime frequency, double voiding, dysuria, hematuria, hesitancy, incontinence, intermittency, nocturia, sensation of incomplete bladder emptying, suprapubic pain, urgency or weak urinary stream.  Current Medications:  Current Outpatient Prescriptions on File Prior to Visit  Medication Sig Dispense Refill  . simvastatin (ZOCOR) 40 MG tablet TAKE 1 TABLET AT BEDTIME 90 tablet 1  . valsartan (DIOVAN) 80 MG tablet TAKE 1 TABLET DAILY FOR BP - DUE OFFICE VISIT BEFORE REFILL 30 tablet 0   No current facility-administered medications on file prior to visit.    Health Maintenance:  Immunization History  Administered Date(s) Administered  . Tdap 10/19/2008    Tetanus: 2010 Flu vaccine: Declined Zostavax:  Colonoscopy:2024 Eye Exam:  Dr. Hyacinth MeekerMiller, 2016 Dentist: Twice yearly visits  Patient Care Team: Lucky CowboyWilliam McKeown, MD as PCP - General (Internal Medicine) Sharrell KuJeffrey Medoff, MD as Consulting Physician (Gastroenterology) Antonietta Barcelonaharles David Miller, OD as Referring Physician (Optometry) Beatrice LecherScott T Weaver, PA-C as Physician Assistant (Physician Assistant) Nils FlackShelley Cathcart, MD as Referring Physician (Dermatology)  Allergies: No Known Allergies  Medical History:  Past Medical History  Diagnosis Date  . Hyperlipidemia   . Hypertension   . Shingles   . Vitamin D deficiency   . Heart murmur     Surgical History:  Past Surgical History  Procedure Laterality Date  . Shoulder arthroscopy    . Inguinal hernia repair Bilateral 09/28/2015  Procedure: LAPAROSCOPIC BILATERAL INGUINAL HERNIA REPAIR;  Surgeon: Gaynelle Adu, MD;  Location: [facility];  Service: General;  Laterality: Bilateral;  . Insertion of mesh Bilateral 09/28/2015    Procedure: INSERTION OF MESH;  Surgeon: Gaynelle Adu, MD;  Location: [facility];  Service: General;  Laterality: Bilateral;     Family History:  Family History  Problem Relation Age of Onset  . Cancer Mother     colon, lung  . Heart disease Father   . Hyperlipidemia Father   . Heart attack Father     Social History:   Social History  Substance Use Topics  . Smoking status: Never Smoker   . Smokeless tobacco: Never Used  . Alcohol Use: Yes     Comment: OCCASIONAL BEER     Review of Systems:  Review of Systems  Constitutional: Negative for fever, chills and malaise/fatigue.  HENT: Negative for congestion, ear pain and sore throat.   Eyes: Negative.   Respiratory: Negative for cough, shortness of breath and wheezing.   Cardiovascular: Negative for chest pain, palpitations and leg swelling.  Gastrointestinal: Negative for heartburn, abdominal pain, diarrhea, constipation, blood in stool and melena.  Genitourinary: Negative.   Skin: Negative.   Neurological: Negative for dizziness, sensory change, loss of consciousness and headaches.  Psychiatric/Behavioral: Negative for depression. The patient is not nervous/anxious and does not have insomnia.     Physical Exam: Estimated body mass index is 25.85 kg/(m^2) as calculated from the following:   Height as of this encounter:  (1.727 m).   Weight as of this encounter: 170 lb (77.111 kg). BP 112/62 mmHg  Pulse 96  Temp(Src) 98 F (36.7 C) (Temporal)  Resp 16  Ht  (1.727 m)  Wt 170 lb (77.111 kg)  BMI 25.85 kg/m2  General Appearance: Well nourished, in no apparent distress.  Eyes: PERRLA, EOMs, conjunctiva no swelling or erythema ENT/Mouth: Ear canals clear bilaterally with no erythema, swelling, discharge.  TMs normal bilaterally with no erythema, bulging, or retractions.  Oropharynx clear and moist with no exudate, swelling, or erythema.  Dentition normal.   Neck: Supple, thyroid normal. No bruits, JVD, cervical adenopathy Respiratory: Respiratory effort normal, BS equal bilaterally without rales, rhonchi, wheezing or stridor.  Cardio:  RRR without murmurs, rubs or gallops. Brisk peripheral pulses without edema.  Chest: symmetric, with normal excursions Abdomen: Soft, nontender, no guarding, rebound, hernias, masses, or organomegaly. Musculoskeletal: Full ROM all peripheral extremities,5/5 strength, and normal gait.  Skin: Warm, dry without rashes, lesions, ecchymosis. Neuro: A&Ox3, Cranial nerves intact, reflexes equal bilaterally. Normal muscle tone, no cerebellar symptoms. Sensation intact.  Psych: Normal affect, Insight and Judgment appropriate.   EKG: WNL no changes.  AORTA SCAN: WNL  Over 40 minutes of exam, counseling, chart review and critical decision making was performed  Toni Amend Forcucci 3:17 PM [facility]

## 2016-05-02 LAB — LIPID PANEL
CHOL/HDL RATIO: 3.6 ratio (ref <=5.0)
CHOLESTEROL: 153 mg/dL (ref 125–200)
HDL: 43 mg/dL (ref >=40)
LDL Cholesterol: 69 mg/dL (ref <130)
TRIGLYCERIDES: 207 mg/dL — AB (ref <150)
VLDL: 41 mg/dL — ABNORMAL HIGH (ref <30)

## 2016-05-02 LAB — HEPATIC FUNCTION PANEL
ALT: 10 U/L (ref 9–46)
AST: 13 U/L (ref 10–35)
Albumin: 4.2 g/dL (ref 3.6–5.1)
Alkaline Phosphatase: 55 U/L (ref 40–115)
BILIRUBIN DIRECT: 0.1 mg/dL (ref <=0.2)
BILIRUBIN INDIRECT: 0.3 mg/dL (ref 0.2–1.2)
BILIRUBIN TOTAL: 0.4 mg/dL (ref 0.2–1.2)
Total Protein: 6.9 g/dL (ref 6.1–8.1)

## 2016-05-02 LAB — BASIC METABOLIC PANEL WITH GFR
BUN: 15 mg/dL (ref 7–25)
CALCIUM: 9.3 mg/dL (ref 8.6–10.3)
CO2: 27 mmol/L (ref 20–31)
CREATININE: 0.91 mg/dL (ref 0.70–1.25)
Chloride: 107 mmol/L (ref 98–110)
Glucose, Bld: 90 mg/dL (ref 65–99)
Potassium: 4.1 mmol/L (ref 3.5–5.3)
SODIUM: 138 mmol/L (ref 135–146)

## 2016-05-02 LAB — HEMOGLOBIN A1C
Hgb A1c MFr Bld: 5.9 % — ABNORMAL HIGH (ref <5.7)
MEAN PLASMA GLUCOSE: 123 mg/dL

## 2016-05-02 LAB — INSULIN, RANDOM: Insulin: 54.6 u[IU]/mL — ABNORMAL HIGH (ref 2.0–19.6)

## 2016-05-02 LAB — MICROALBUMIN / CREATININE URINE RATIO
CREATININE, URINE: 201 mg/dL (ref 20–370)
Microalb Creat Ratio: 6 mcg/mg creat (ref <30)
Microalb, Ur: 1.2 mg/dL

## 2016-05-02 LAB — IRON AND TIBC
%SAT: 23 % (ref 15–60)
Iron: 81 ug/dL (ref 50–180)
TIBC: 350 ug/dL (ref 250–425)
UIBC: 269 ug/dL (ref 125–400)

## 2016-05-02 LAB — MAGNESIUM: MAGNESIUM: 1.9 mg/dL (ref 1.5–2.5)

## 2016-05-02 LAB — PSA: PSA: 2.76 ng/mL (ref <=4.00)

## 2016-05-02 LAB — VITAMIN D 25 HYDROXY (VIT D DEFICIENCY, FRACTURES): Vit D, 25-Hydroxy: 32 ng/mL (ref 30–100)

## 2016-07-26 ENCOUNTER — Other Ambulatory Visit: Payer: Self-pay | Admitting: Internal Medicine

## 2016-07-26 DIAGNOSIS — I10 Essential (primary) hypertension: Secondary | ICD-10-CM

## 2016-09-26 ENCOUNTER — Other Ambulatory Visit: Payer: Self-pay | Admitting: Internal Medicine

## 2016-11-01 ENCOUNTER — Ambulatory Visit: Payer: Self-pay | Admitting: Internal Medicine

## 2016-12-04 ENCOUNTER — Ambulatory Visit: Payer: Self-pay | Admitting: Internal Medicine

## 2016-12-05 ENCOUNTER — Encounter: Payer: Self-pay | Admitting: Internal Medicine

## 2016-12-05 ENCOUNTER — Ambulatory Visit (INDEPENDENT_AMBULATORY_CARE_PROVIDER_SITE_OTHER): Payer: BLUE CROSS/BLUE SHIELD | Admitting: Internal Medicine

## 2016-12-05 VITALS — BP 126/82 | HR 78 | Temp 97.3°F | Resp 16 | Wt 168.0 lb

## 2016-12-05 DIAGNOSIS — E782 Mixed hyperlipidemia: Secondary | ICD-10-CM

## 2016-12-05 DIAGNOSIS — I10 Essential (primary) hypertension: Secondary | ICD-10-CM | POA: Diagnosis not present

## 2016-12-05 DIAGNOSIS — N401 Enlarged prostate with lower urinary tract symptoms: Secondary | ICD-10-CM | POA: Diagnosis not present

## 2016-12-05 DIAGNOSIS — R351 Nocturia: Secondary | ICD-10-CM

## 2016-12-05 DIAGNOSIS — Z79899 Other long term (current) drug therapy: Secondary | ICD-10-CM

## 2016-12-05 LAB — HEPATIC FUNCTION PANEL
ALK PHOS: 51 U/L (ref 40–115)
ALT: 10 U/L (ref 9–46)
AST: 15 U/L (ref 10–35)
Albumin: 4.3 g/dL (ref 3.6–5.1)
BILIRUBIN INDIRECT: 0.5 mg/dL (ref 0.2–1.2)
Bilirubin, Direct: 0.1 mg/dL (ref <=0.2)
TOTAL PROTEIN: 7 g/dL (ref 6.1–8.1)
Total Bilirubin: 0.6 mg/dL (ref 0.2–1.2)

## 2016-12-05 LAB — BASIC METABOLIC PANEL WITH GFR
BUN: 14 mg/dL (ref 7–25)
CALCIUM: 9.2 mg/dL (ref 8.6–10.3)
CO2: 28 mmol/L (ref 20–31)
Chloride: 104 mmol/L (ref 98–110)
Creat: 0.77 mg/dL (ref 0.70–1.25)
GFR, Est African American: >89 mL/min (ref >=60)
GLUCOSE: 79 mg/dL (ref 65–99)
POTASSIUM: 4.5 mmol/L (ref 3.5–5.3)
Sodium: 141 mmol/L (ref 135–146)

## 2016-12-05 LAB — CBC WITH DIFFERENTIAL/PLATELET
Basophils Absolute: 76 cells/uL (ref 0–200)
Basophils Relative: 1 %
EOS ABS: 380 {cells}/uL (ref 15–500)
Eosinophils Relative: 5 %
HCT: 38.4 % — ABNORMAL LOW (ref 38.5–50.0)
HEMOGLOBIN: 12.8 g/dL — AB (ref 13.2–17.1)
LYMPHS ABS: 1596 {cells}/uL (ref 850–3900)
Lymphocytes Relative: 21 %
MCH: 29.4 pg (ref 27.0–33.0)
MCHC: 33.3 g/dL (ref 32.0–36.0)
MCV: 88.1 fL (ref 80.0–100.0)
MONO ABS: 608 {cells}/uL (ref 200–950)
MPV: 10.4 fL (ref 7.5–12.5)
Monocytes Relative: 8 %
NEUTROS PCT: 65 %
Neutro Abs: 4940 cells/uL (ref 1500–7800)
Platelets: 237 10*3/uL (ref 140–400)
RBC: 4.36 MIL/uL (ref 4.20–5.80)
RDW: 14.2 % (ref 11.0–15.0)
WBC: 7.6 10*3/uL (ref 3.8–10.8)

## 2016-12-05 LAB — LIPID PANEL
CHOLESTEROL: 163 mg/dL (ref <200)
HDL: 51 mg/dL (ref >40)
LDL CALC: 96 mg/dL (ref <100)
Total CHOL/HDL Ratio: 3.2 Ratio (ref <5.0)
Triglycerides: 79 mg/dL (ref <150)
VLDL: 16 mg/dL (ref <30)

## 2016-12-05 MED ORDER — EZETIMIBE 10 MG PO TABS: 10.0000 mg | ORAL_TABLET | Freq: Every day | ORAL | 11 refills | Status: DC

## 2016-12-05 MED ORDER — DOXAZOSIN MESYLATE 2 MG PO TABS: 2.0000 mg | ORAL_TABLET | Freq: Every day | ORAL | 11 refills | Status: DC

## 2016-12-05 NOTE — Progress Notes (Signed)
Assessment and Plan:  Hypertension:  -stop valsartan -start doxazosin for prostate and BP coverage -Continue medication,  -monitor blood pressure at home.  -Continue DASH diet.   -Reminder to go to the ER if any CP, SOB, nausea, dizziness, severe HA, changes vision/speech, left arm numbness and tingling, and jaw pain.  Cholesterol: -stop simvistatin -try zetia -Continue diet and exercise.  -Check cholesterol.   Pre-diabetes: -Continue diet and exercise.   Vitamin D Def: -continue medications.   BPH -try doxazosin -LUTS score of 7  Continue diet and meds as discussed. Further disposition pending results of labs.  HPI 61 y.o. male  presents for 3 month follow up with hypertension, hyperlipidemia, prediabetes and vitamin D.   His blood pressure has been controlled at home, today their BP is BP: 126/82.   He does workout. He denies chest pain, shortness of breath, dizziness.   He is on cholesterol medication and denies myalgias. His cholesterol is at goal. The cholesterol last visit was:   Lab Results  Component Value Date   CHOL 153 05/01/2016   HDL 43 05/01/2016   LDLCALC 69 05/01/2016   TRIG 207 (H) 05/01/2016   CHOLHDL 3.6 05/01/2016  He does get mild restlessness and pain in his legs at night.  He is interested in trying something else for his cholesterol.     He has been working on diet and exercise for prediabetes, and denies foot ulcerations, hyperglycemia, hypoglycemia , increased appetite, nausea, paresthesia of the feet, polydipsia, polyuria, visual disturbances, vomiting and weight loss. Last A1C in the office was:  Lab Results  Component Value Date   HGBA1C 5.9 (H) 05/01/2016    Patient is on Vitamin D supplement.  Lab Results  Component Value Date   VD25OH 32 05/01/2016     He does have a lot of nighttime frequency of urination.  No dysuria.  Does have mild double voiding and daytime frequency.  Occasional dribbling.  No hesitancy.    He is currently  on a work sabbatical for the next 6 months.  He is about to head down to [place] for preseason baseball for vacation.   Current Medications:  Current Outpatient Prescriptions on File Prior to Visit  Medication Sig Dispense Refill  . simvastatin (ZOCOR) 40 MG tablet TAKE 1 TABLET AT BEDTIME 90 tablet 1  . valsartan (DIOVAN) 80 MG tablet TAKE 1 TABLET DAILY FOR BP - DUE OFFICE VISIT BEFORE REFILL 30 tablet 0  . valsartan (DIOVAN) 80 MG tablet TAKE 1 TABLET DAILY FOR BP - DUE OFFICE VISIT BEFORE REFILL 90 tablet 1   No current facility-administered medications on file prior to visit.     Medical History:  Past Medical History:  Diagnosis Date  . Heart murmur   . Hyperlipidemia   . Hypertension   . Shingles   . Vitamin D deficiency     Allergies: No Known Allergies   Review of Systems:  Review of Systems  Constitutional: Negative for chills, fever and malaise/fatigue.  HENT: Negative for congestion, ear pain and sore throat.   Eyes: Negative.   Respiratory: Negative for cough, shortness of breath and wheezing.   Cardiovascular: Negative for chest pain, palpitations and leg swelling.  Gastrointestinal: Negative for abdominal pain, blood in stool, constipation, diarrhea, heartburn and melena.  Genitourinary: Positive for frequency.  Skin: Negative.   Neurological: Negative for dizziness, sensory change, loss of consciousness and headaches.  Psychiatric/Behavioral: Negative for depression. The patient is not nervous/anxious and does not have insomnia.  Family history- Review and unchanged  Social history- Review and unchanged  Physical Exam: BP 126/82   Pulse 78   Temp 97.3 F (36.3 C)   Resp 16   Wt 168 lb (76.2 kg)   SpO2 97%   BMI 25.54 kg/m  Wt Readings from Last 3 Encounters:  12/05/16 168 lb (76.2 kg)  05/01/16 170 lb (77.1 kg)  01/31/16 166 lb (75.3 kg)    General Appearance: Well nourished well developed, in no apparent distress. Eyes: PERRLA, EOMs,  conjunctiva no swelling or erythema ENT/Mouth: Ear canals normal without obstruction, swelling, erythma, discharge.  TMs normal bilaterally.  Oropharynx moist, clear, without exudate, or postoropharyngeal swelling. Neck: Supple, thyroid normal,no cervical adenopathy  Respiratory: Respiratory effort normal, Breath sounds clear A&P without rhonchi, wheeze, or rale.  No retractions, no accessory usage. Cardio: RRR with no MRGs. Brisk peripheral pulses without edema.  Abdomen: Soft, + BS,  Non tender, no guarding, rebound, hernias, masses. Musculoskeletal: Full ROM, 5/5 strength, Normal gait Skin: Warm, dry without rashes, lesions, ecchymosis.  Neuro: Awake and oriented X 3, Cranial nerves intact. Normal muscle tone, no cerebellar symptoms. Psych: Normal affect, Insight and Judgment appropriate.    Terri Piedra, PA-C 10:01 AM [facility]

## 2016-12-17 ENCOUNTER — Telehealth: Payer: Self-pay | Admitting: *Deleted

## 2016-12-17 NOTE — Telephone Encounter (Signed)
Patient called stating the Zetia and Doxazosin "not agreeing with him and would like to go back to previous rxs."  Per Terri Piedraourtney Forcucci, PA-C orders, patient was advised it was ok to d/c Zetia and Doxazosin and restart Simvastatin and Diovan Rxs.  Patient states he has plenty at home and does not need new refills sent into pharmacy at this time.

## 2016-12-18 ENCOUNTER — Other Ambulatory Visit: Payer: Self-pay | Admitting: *Deleted

## 2016-12-18 MED ORDER — VALSARTAN 80 MG PO TABS: 80.0000 mg | ORAL_TABLET | Freq: Every day | ORAL | 0 refills | Status: DC

## 2016-12-18 MED ORDER — SIMVASTATIN 40 MG PO TABS: 40.0000 mg | ORAL_TABLET | Freq: Every day | ORAL | 0 refills | Status: DC

## 2017-02-25 ENCOUNTER — Encounter: Payer: Self-pay | Admitting: *Deleted

## 2017-03-06 ENCOUNTER — Encounter: Payer: Self-pay | Admitting: *Deleted

## 2017-03-11 ENCOUNTER — Encounter: Payer: Self-pay | Admitting: *Deleted

## 2017-04-22 ENCOUNTER — Other Ambulatory Visit: Payer: Self-pay | Admitting: Internal Medicine

## 2017-05-01 ENCOUNTER — Encounter: Payer: Self-pay | Admitting: Internal Medicine

## 2017-05-05 ENCOUNTER — Other Ambulatory Visit: Payer: Self-pay

## 2017-05-05 MED ORDER — SIMVASTATIN 40 MG PO TABS: 40.0000 mg | ORAL_TABLET | Freq: Every day | ORAL | 0 refills | Status: DC

## 2017-07-22 ENCOUNTER — Other Ambulatory Visit: Payer: Self-pay | Admitting: Internal Medicine

## 2017-07-31 ENCOUNTER — Encounter: Payer: Self-pay | Admitting: Physician Assistant

## 2017-08-19 ENCOUNTER — Encounter: Payer: Self-pay | Admitting: Physician Assistant

## 2017-09-02 ENCOUNTER — Encounter: Payer: Self-pay | Admitting: Internal Medicine

## 2017-09-02 ENCOUNTER — Ambulatory Visit (INDEPENDENT_AMBULATORY_CARE_PROVIDER_SITE_OTHER): Payer: BLUE CROSS/BLUE SHIELD | Admitting: Internal Medicine

## 2017-09-02 VITALS — BP 118/70 | HR 80 | Temp 97.7°F | Resp 16 | Ht 68.75 in | Wt 171.2 lb

## 2017-09-02 DIAGNOSIS — E782 Mixed hyperlipidemia: Secondary | ICD-10-CM

## 2017-09-02 DIAGNOSIS — E559 Vitamin D deficiency, unspecified: Secondary | ICD-10-CM | POA: Diagnosis not present

## 2017-09-02 DIAGNOSIS — Z125 Encounter for screening for malignant neoplasm of prostate: Secondary | ICD-10-CM | POA: Diagnosis not present

## 2017-09-02 DIAGNOSIS — Z1212 Encounter for screening for malignant neoplasm of rectum: Secondary | ICD-10-CM

## 2017-09-02 DIAGNOSIS — Z136 Encounter for screening for cardiovascular disorders: Secondary | ICD-10-CM | POA: Diagnosis not present

## 2017-09-02 DIAGNOSIS — Z111 Encounter for screening for respiratory tuberculosis: Secondary | ICD-10-CM

## 2017-09-02 DIAGNOSIS — R7303 Prediabetes: Secondary | ICD-10-CM

## 2017-09-02 DIAGNOSIS — I10 Essential (primary) hypertension: Secondary | ICD-10-CM | POA: Diagnosis not present

## 2017-09-02 DIAGNOSIS — Z79899 Other long term (current) drug therapy: Secondary | ICD-10-CM | POA: Diagnosis not present

## 2017-09-02 DIAGNOSIS — G478 Other sleep disorders: Secondary | ICD-10-CM

## 2017-09-02 DIAGNOSIS — Z0000 Encounter for general adult medical examination without abnormal findings: Secondary | ICD-10-CM | POA: Diagnosis not present

## 2017-09-02 DIAGNOSIS — R5383 Other fatigue: Secondary | ICD-10-CM

## 2017-09-02 DIAGNOSIS — Z23 Encounter for immunization: Secondary | ICD-10-CM

## 2017-09-02 DIAGNOSIS — Z1211 Encounter for screening for malignant neoplasm of colon: Secondary | ICD-10-CM

## 2017-09-02 DIAGNOSIS — Z0001 Encounter for general adult medical examination with abnormal findings: Secondary | ICD-10-CM

## 2017-09-02 NOTE — Patient Instructions (Signed)

## 2017-09-02 NOTE — Progress Notes (Signed)
[facility]   [name] CowboyWilliam Alyha [name], M.D.     Dyanne CarrelAmanda R. Steffanie Dunnollier, P.A.-C Judd GaudierAshley Corbett, DNP [facility]                [address]                [place], [place] [id number] Telephone [phone] Telefax [phone]) [phone] Annual  Screening/Preventative Visit  & Comprehensive Evaluation & Examination     This very nice 61 y.o. DWM presents for a Screening/Preventative Visit & comprehensive evaluation and management of multiple medical co-morbidities.  Patient has been followed for HTN, Prediabetes, Hyperlipidemia and Vitamin D Deficiency.     Patient presents with concerns of possible sleep apnea and relates hx/o snoring but has no partner to observe for apnea, but he endorses poor sleep hygiene with unrestful sleep and excessive daytime sleepiness and fatigue.      HTN predates since 2004. Patient's BP has been controlled at home.  Today's BP is at goal -118/70. Patient denies any cardiac symptoms as chest pain, palpitations, shortness of breath, dizziness or ankle swelling.     Patient's hyperlipidemia is controlled with diet and medications. Patient denies myalgias or other medication SE's. Last lipids were at goal: Lab Results  Component Value Date   CHOL 163 12/05/2016   HDL 51 12/05/2016   LDLCALC 96 12/05/2016   TRIG 79 12/05/2016   CHOLHDL 3.2 12/05/2016      Patient has prediabetes (A1c 6.0% /2015,  6.5% /2016 and 5.9%/2017 and patient denies reactive hypoglycemic symptoms, visual blurring, diabetic polys or paresthesias. Last A1c was still not at goal: Lab Results  Component Value Date   HGBA1C 5.9 (H) 05/01/2016       Finally, patient has history of Vitamin D Deficiency ("35" /2009) and last vitamin D was still not at goal: Lab Results  Component Value Date   VD25OH 32 05/01/2016   Current Outpatient Medications on File Prior to Visit  Medication Sig  . simvastatin (ZOCOR) 40 MG tablet Take 1 tablet (40 mg  total) by mouth daily.  . valsartan (DIOVAN) 80 MG tablet TAKE 1 TABLET BY MOUTH EVERY DAY   No Known Allergies   Past Medical History:  Diagnosis Date  . Heart murmur   . Hyperlipidemia   . Hypertension   . Shingles   . Vitamin D deficiency    Health Maintenance  Topic Date Due  . Hepatitis C Screening  September 24, 1956  . HIV Screening  11/21/1968  . INFLUENZA VACCINE  11/14/2017 (Originally 05/14/2017)  . TETANUS/TDAP  10/19/2018  . COLONOSCOPY  12/13/2022   Immunization History  Administered Date(s) Administered  . Tdap 10/19/2008   Past Surgical History:  Procedure Laterality Date  . INGUINAL HERNIA REPAIR Bilateral 09/28/2015   Procedure: LAPAROSCOPIC BILATERAL INGUINAL HERNIA REPAIR;  Surgeon: Gaynelle AduEric [name], MD;  Location: [facility];  Service: General;  Laterality: Bilateral;  . INSERTION OF MESH Bilateral 09/28/2015   Procedure: INSERTION OF MESH;  Surgeon: Gaynelle AduEric [name], MD;  Location: [facility];  Service: General;  Laterality: Bilateral;  . SHOULDER ARTHROSCOPY     Family History  Problem Relation Age of Onset  . Cancer Mother        colon, lung  . Heart disease Father   . Hyperlipidemia Father   . Heart attack Father    Occupational History  . Business consultant  Tobacco Use  . Smoking status: Never Smoker  . Smokeless tobacco: Never Used  Substance  and Sexual Activity  . Alcohol use: Yes    Comment: OCCASIONAL BEER   . Drug use: No  . Sexual activity: Not on file    ROS Constitutional: Denies fever, chills, weight loss/gain, headaches, insomnia,  night sweats or change in appetite. Does c/o fatigue. Eyes: Denies redness, blurred vision, diplopia, discharge, itchy or watery eyes.  ENT: Denies discharge, congestion, post nasal drip, epistaxis, sore throat, earache, hearing loss, dental pain, Tinnitus, Vertigo, Sinus pain or snoring.  Cardio: Denies chest pain, palpitations, irregular heartbeat, syncope, dyspnea, diaphoresis, orthopnea, PND, claudication or  edema Respiratory: denies cough, dyspnea, DOE, pleurisy, hoarseness, laryngitis or wheezing.  Gastrointestinal: Denies dysphagia, heartburn, reflux, water brash, pain, cramps, nausea, vomiting, bloating, diarrhea, constipation, hematemesis, melena, hematochezia, jaundice or hemorrhoids Genitourinary: Denies dysuria, frequency, urgency, nocturia, hesitancy, discharge, hematuria or flank pain Musculoskeletal: Denies arthralgia, myalgia, stiffness, Jt. Swelling, pain, limp or strain/sprain. Denies Falls. Skin: Denies puritis, rash, hives, warts, acne, eczema or change in skin lesion Neuro: No weakness, tremor, incoordination, spasms, paresthesia or pain Psychiatric: Denies confusion, memory loss or sensory loss. Denies Depression. Endocrine: Denies change in weight, skin, hair change, nocturia, and paresthesia, diabetic polys, visual blurring or hyper / hypo glycemic episodes.  Heme/Lymph: No excessive bleeding, bruising or enlarged lymph nodes.  Physical Exam  BP 118/70   Pulse 80   Temp 97.7 F (36.5 C)   Resp 16   Ht 5' 8.75" (1.746 m)   Wt 171 lb 3.2 oz (77.7 kg)   BMI 25.47 kg/m   General Appearance: Well nourished and well groomed and in no apparent distress.  Eyes: PERRLA, EOMs, conjunctiva no swelling or erythema, normal fundi and vessels. Sinuses: No frontal/maxillary tenderness ENT/Mouth: EACs patent / TMs  nl. Nares clear without erythema, swelling, mucoid exudates. Oral hygiene is good. No erythema, swelling, or exudate. Mallampati II. Tonsils not swollen or erythematous. Hearing normal.  Neck: Supple, thyroid normal. No bruits, nodes or JVD. Respiratory: Respiratory effort normal.  BS equal and clear bilateral without rales, rhonci, wheezing or stridor. Cardio: Heart sounds are normal with regular rate and rhythm and no murmurs, rubs or gallops. Peripheral pulses are normal and equal bilaterally without edema. No aortic or femoral bruits. Chest: symmetric with normal  excursions and percussion.  Abdomen: Soft, with Nl bowel sounds. Nontender, no guarding, rebound, hernias, masses, or organomegaly.  Lymphatics: Non tender without lymphadenopathy.  Genitourinary: No hernias.Testes nl. DRE - prostate nl for age - smooth & firm w/o nodules. Musculoskeletal: Full ROM all peripheral extremities, joint stability, 5/5 strength, and normal gait. Skin: Warm and dry without rashes, lesions, cyanosis, clubbing or  ecchymosis.  Neuro: Cranial nerves intact, reflexes equal bilaterally. Normal muscle tone, no cerebellar symptoms. Sensation intact.  Pysch: Alert and oriented X 3 with normal affect, insight and judgment appropriate.   Assessment and Plan  1. Annual Preventative/Screening Exam   2. Essential hypertension, benign  - EKG 12-Lead - [place], RETROPERITNL ABD,  LTD - Urinalysis, Routine w reflex microscopic - Microalbumin / creatinine urine ratio - CBC with Differential/Platelet - BASIC METABOLIC PANEL WITH GFR - Magnesium - TSH  3. Hyperlipidemia, mixed  - EKG 12-Lead - [place], RETROPERITNL ABD,  LTD - Hepatic function panel - Lipid panel - TSH  4. Prediabetes  - EKG 12-Lead - [place], RETROPERITNL ABD,  LTD - Hemoglobin A1c - Insulin, random  5. Vitamin D deficiency  - VITAMIN D 25 Hydroxy  6. Non-restorative sleep  - Ambulatory referral to Sleep Studies  7. Screening for  colorectal cancer  - POC Hemoccult Bld/Stl   8. Screening for prostate cancer  - PSA  9. Screening for ischemic heart disease  - EKG 12-Lead  10. Screening for AAA (aortic abdominal aneurysm)  - US, RETROPERITNL ABD,  LTD  11. Screening examination for pulmonary tuberculosis  - PPD  12. Need for immunization against influenza  - FLU VACCINE MDCK QUAD W/Preservative  13. Fatigue  - Iron,Total/Total Iron Binding Cap - Vitamin B12 - Testosterone - CBC with Differential/Platelet - TSH  14. Medication management  - Urinalysis, Routine w reflex  microscopic - Microalbumin / creatinine urine ratio - CBC with Differential/Platelet - BASIC METABOLIC PANEL WITH GFR - Hepatic function panel - Magnesium - Lipid panel - TSH - Hemoglobin A1c - Insulin, random - VITAMIN D 25 Hydroxy       Patient was counseled in prudent diet, weight control to achieve/maintain BMI less than 25, BP monitoring, regular exercise and medications as discussed.  Discussed med effects and SE's. Routine screening labs and tests as requested with regular follow-up as recommended. Over 40 minutes of exam, counseling, chart review and high complex critical decision making was performed

## 2017-09-03 LAB — TSH: TSH: 2.15 mIU/L (ref 0.40–4.50)

## 2017-09-03 LAB — CBC WITH DIFFERENTIAL/PLATELET
BASOS ABS: 79 {cells}/uL (ref 0–200)
BASOS PCT: 1.1 %
Eosinophils Absolute: 461 cells/uL (ref 15–500)
Eosinophils Relative: 6.4 %
HEMATOCRIT: 36 % — AB (ref 38.5–50.0)
HEMOGLOBIN: 12 g/dL — AB (ref 13.2–17.1)
LYMPHS ABS: 1937 {cells}/uL (ref 850–3900)
MCH: 29.3 pg (ref 27.0–33.0)
MCHC: 33.3 g/dL (ref 32.0–36.0)
MCV: 88 fL (ref 80.0–100.0)
MPV: 11 fL (ref 7.5–12.5)
Monocytes Relative: 7.8 %
NEUTROS ABS: 4162 {cells}/uL (ref 1500–7800)
Neutrophils Relative %: 57.8 %
Platelets: 254 10*3/uL (ref 140–400)
RBC: 4.09 10*6/uL — AB (ref 4.20–5.80)
RDW: 12.4 % (ref 11.0–15.0)
Total Lymphocyte: 26.9 %
WBC mixed population: 562 cells/uL (ref 200–950)
WBC: 7.2 10*3/uL (ref 3.8–10.8)

## 2017-09-03 LAB — HEMOGLOBIN A1C
HEMOGLOBIN A1C: 5.8 %{Hb} — AB (ref <5.7)
Mean Plasma Glucose: 120 (calc)
eAG (mmol/L): 6.6 (calc)

## 2017-09-03 LAB — LIPID PANEL
CHOL/HDL RATIO: 3.3 (calc) (ref <5.0)
CHOLESTEROL: 140 mg/dL (ref <200)
HDL: 42 mg/dL (ref >40)
LDL Cholesterol (Calc): 76 mg/dL (calc)
NON-HDL CHOLESTEROL (CALC): 98 mg/dL (ref <130)
TRIGLYCERIDES: 140 mg/dL (ref <150)

## 2017-09-03 LAB — HEPATIC FUNCTION PANEL
AG RATIO: 1.8 (calc) (ref 1.0–2.5)
ALBUMIN MSPROF: 4.1 g/dL (ref 3.6–5.1)
ALT: 9 U/L (ref 9–46)
AST: 13 U/L (ref 10–35)
Alkaline phosphatase (APISO): 59 U/L (ref 40–115)
BILIRUBIN TOTAL: 0.4 mg/dL (ref 0.2–1.2)
Bilirubin, Direct: 0.1 mg/dL (ref 0.0–0.2)
Globulin: 2.3 g/dL (calc) (ref 1.9–3.7)
Indirect Bilirubin: 0.3 mg/dL (calc) (ref 0.2–1.2)
TOTAL PROTEIN: 6.4 g/dL (ref 6.1–8.1)

## 2017-09-03 LAB — URINALYSIS, ROUTINE W REFLEX MICROSCOPIC
Bilirubin Urine: NEGATIVE
Glucose, UA: NEGATIVE
HGB URINE DIPSTICK: NEGATIVE
KETONES UR: NEGATIVE
LEUKOCYTES UA: NEGATIVE
NITRITE: NEGATIVE
PH: 7.5 (ref 5.0–8.0)
PROTEIN: NEGATIVE
Specific Gravity, Urine: 1.024 (ref 1.001–1.03)

## 2017-09-03 LAB — INSULIN, RANDOM: Insulin: 6 u[IU]/mL (ref 2.0–19.6)

## 2017-09-03 LAB — BASIC METABOLIC PANEL WITH GFR
BUN: 22 mg/dL (ref 7–25)
CALCIUM: 8.9 mg/dL (ref 8.6–10.3)
CO2: 27 mmol/L (ref 20–32)
CREATININE: 0.86 mg/dL (ref 0.70–1.25)
Chloride: 107 mmol/L (ref 98–110)
GFR, EST NON AFRICAN AMERICAN: 92 mL/min/{1.73_m2} (ref >=60)
GFR, Est African American: 107 mL/min/{1.73_m2} (ref >=60)
GLUCOSE: 82 mg/dL (ref 65–99)
Potassium: 4.5 mmol/L (ref 3.5–5.3)
Sodium: 141 mmol/L (ref 135–146)

## 2017-09-03 LAB — MICROALBUMIN / CREATININE URINE RATIO
CREATININE, URINE: 144 mg/dL (ref 20–320)
MICROALB UR: 1.5 mg/dL
MICROALB/CREAT RATIO: 10 ug/mg{creat} (ref <30)

## 2017-09-03 LAB — MAGNESIUM: Magnesium: 1.7 mg/dL (ref 1.5–2.5)

## 2017-09-03 LAB — VITAMIN B12: VITAMIN B 12: 385 pg/mL (ref 200–1100)

## 2017-09-03 LAB — VITAMIN D 25 HYDROXY (VIT D DEFICIENCY, FRACTURES): VIT D 25 HYDROXY: 34 ng/mL (ref 30–100)

## 2017-09-03 LAB — TESTOSTERONE: Testosterone: 262 ng/dL (ref 250–827)

## 2017-09-03 LAB — IRON, TOTAL/TOTAL IRON BINDING CAP
%SAT: 30 % (ref 15–60)
Iron: 97 ug/dL (ref 50–180)
TIBC: 324 mcg/dL (calc) (ref 250–425)

## 2017-09-03 LAB — PSA: PSA: 3.3 ng/mL (ref <=4.0)

## 2017-10-15 ENCOUNTER — Ambulatory Visit: Payer: BLUE CROSS/BLUE SHIELD | Admitting: Neurology

## 2017-10-15 ENCOUNTER — Encounter: Payer: Self-pay | Admitting: Neurology

## 2017-10-15 VITALS — BP 142/86 | HR 79 | Ht 70.0 in | Wt 174.0 lb

## 2017-10-15 DIAGNOSIS — Z820 Family history of epilepsy and other diseases of the nervous system: Secondary | ICD-10-CM

## 2017-10-15 DIAGNOSIS — G4719 Other hypersomnia: Secondary | ICD-10-CM | POA: Diagnosis not present

## 2017-10-15 DIAGNOSIS — G2581 Restless legs syndrome: Secondary | ICD-10-CM

## 2017-10-15 DIAGNOSIS — R0681 Apnea, not elsewhere classified: Secondary | ICD-10-CM | POA: Diagnosis not present

## 2017-10-15 DIAGNOSIS — R0683 Snoring: Secondary | ICD-10-CM | POA: Diagnosis not present

## 2017-10-15 DIAGNOSIS — R351 Nocturia: Secondary | ICD-10-CM

## 2017-10-15 NOTE — Progress Notes (Signed)
Subjective:    Patient ID: [name] [name] is a 62 y.o. male.  HPI     [name], MD, PhD [name] [address] [address] [place], [place] [address]  Dear Dr. Oneta Rack,   I saw your patient, [name] [name], upon your kind request in my neurologic clinic today for initial consultation of his sleep disorder, in particular, concern for underlying obstructive sleep apnea. The patient is unaccompanied today. As you know, [name] is a 62 year old right-handed gentleman with an underlying medical history of hypertension, hyperlipidemia, prediabetes, and vitamin D deficiency, who reports snoring and excessive daytime somnolence. He has been told that he has breathing pauses while asleep. He has woken himself up with a sense of gasping for air. His brother has sleep apnea and uses a CPAP machine. I reviewed your office note from 09/02/2017. His Epworth sleepiness score is 7 out of 24 today, fatigue score is 34 out of 63. He is divorced and lives with his son. He has 3 grown children. He is a nonsmoker and does not utilize alcohol, does not drink caffeine on a daily basis. He does not wake up rested. His bedtime is around 8 and wake up time generally between 4 and 4:30. This has been his pattern for years. He does not have prolonged mental of the night awakenings. He does have occasional restless leg symptoms which can be quite significant at times but, and go. He prefers to sleep on his sides. He would prefer a CPAP trial over an oral appliance or surgical intervention for sleep apnea if the need arises. He would be interested in coming back for a sleep study. He has a history of heart murmur for which he had testing before. He had an echocardiogram in July 2015 which was reasonably benign, showed tricuspid regurgitation which would explain his systolic murmur.  His Past Medical History Is Significant For: Past Medical History:  Diagnosis Date  . Heart murmur    . Hyperlipidemia   . Hypertension   . Shingles   . Vitamin D deficiency     His Past Surgical History Is Significant For: Past Surgical History:  Procedure Laterality Date  . INGUINAL HERNIA REPAIR Bilateral 09/28/2015   Procedure: LAPAROSCOPIC BILATERAL INGUINAL HERNIA REPAIR;  Surgeon: Gaynelle Adu, MD;  Location: [facility];  Service: General;  Laterality: Bilateral;  . INSERTION OF MESH Bilateral 09/28/2015   Procedure: INSERTION OF MESH;  Surgeon: Gaynelle Adu, MD;  Location: [facility];  Service: General;  Laterality: Bilateral;  . SHOULDER ARTHROSCOPY      His Family History Is Significant For: Family History  Problem Relation Age of Onset  . Cancer Mother        colon, lung  . Heart disease Father   . Hyperlipidemia Father   . Heart attack Father     His Social History Is Significant For: Social History   Socioeconomic History  . Marital status: Married    Spouse name: None  . Number of children: None  . Years of education: None  . Highest education level: None  Social Needs  . Financial resource strain: None  . Food insecurity - worry: None  . Food insecurity - inability: None  . Transportation needs - medical: None  . Transportation needs - non-medical: None  Occupational History  . None  Tobacco Use  . Smoking status: Never Smoker  . Smokeless tobacco: Never Used  Substance and Sexual Activity  . Alcohol use: Yes  Comment: OCCASIONAL BEER   . Drug use: No  . Sexual activity: None  Other Topics Concern  . None  Social History Narrative  . None    His Allergies Are:  No Known Allergies:   His Current Medications Are:  Outpatient Encounter Medications as of 10/15/2017  Medication Sig  . simvastatin (ZOCOR) 40 MG tablet Take 1 tablet (40 mg total) by mouth daily.  . valsartan (DIOVAN) 80 MG tablet TAKE 1 TABLET BY MOUTH EVERY DAY   No facility-administered encounter medications on file as of 10/15/2017.   :  Review of Systems:  Out of a complete 14  point review of systems, all are reviewed and negative with the exception of these symptoms as listed below:  Review of Systems  Neurological:       Pt presents today to discuss his sleep. Pt has never had a sleep study but does endorse snoring.   Epworth Sleepiness Scale 0= would never doze 1= slight chance of dozing 2= moderate chance of dozing 3= high chance of dozing  Sitting and reading: 0 Watching TV: 2 Sitting inactive in a public place (ex. Theater or meeting): 0 As a passenger in a car for an hour without a break: 2 Lying down to rest in the afternoon: 2 Sitting and talking to someone: 0 Sitting quietly after lunch (no alcohol): 1 In a car, while stopped in traffic: 0 Total: 7    Objective:  Neurological Exam  Physical Exam Physical Examination:   Vitals:   10/15/17 1456  BP: (!) 142/86  Pulse: 79   General Examination: The patient is a very pleasant 62 y.o. male in no acute distress. He appears well-developed and well-nourished and well groomed.   HEENT: Normocephalic, atraumatic, pupils are equal, round and reactive to light and accommodation. Extraocular tracking is good without limitation to gaze excursion or nystagmus noted. Normal smooth pursuit is noted. Hearing is grossly intact. Face is symmetric with normal facial animation and normal facial sensation. Speech is clear with no dysarthria noted. There is no hypophonia. There is no lip, neck/head, jaw or voice tremor. Neck is supple with full range of passive and active motion. There are no carotid bruits on auscultation. Oropharynx exam reveals: mild mouth dryness, good dental hygiene and moderate airway crowding, due to larger uvula and smaller airway entry. Mallampati is class II. Tongue protrudes centrally and palate elevates symmetrically. Tonsils are 1+ in size. Neck size is 16 inches. He has a Mild overbite.   Chest: Clear to auscultation without wheezing, rhonchi or crackles noted.  Heart: S1+S2+0,  regular and normal without murmurs, rubs or gallops noted.   Abdomen: Soft, non-tender and non-distended with normal bowel sounds appreciated on auscultation.  Extremities: There is no pitting edema in the distal lower extremities bilaterally. Pedal pulses are intact.  Skin: Warm and dry without trophic changes noted.  Musculoskeletal: exam reveals no obvious joint deformities, tenderness or joint swelling or erythema.   Neurologically:  Mental status: The patient is awake, alert and oriented in all 4 spheres. His immediate and remote memory, attention, language skills and fund of knowledge are appropriate. There is no evidence of aphasia, agnosia, apraxia or anomia. Speech is clear with normal prosody and enunciation. Thought process is linear. Mood is normal and affect is normal.  Cranial nerves II - XII are as described above under HEENT exam. In addition: shoulder shrug is normal with equal shoulder height noted. Motor exam: Normal bulk, strength and tone is noted. There  is no drift, tremor or rebound. Romberg is negative. Reflexes are 1+ throughout. Fine motor skills and coordination: intact with normal finger taps, normal hand movements, normal rapid alternating patting, normal foot taps and normal foot agility.  Cerebellar testing: No dysmetria or intention tremor on finger to nose testing. Heel to shin is unremarkable bilaterally. There is no truncal or gait ataxia.  Sensory exam: intact to light touch in the upper and lower extremities.  Gait, station and balance: He stands easily. No veering to one side is noted. No leaning to one side is noted. Posture is age-appropriate and stance is narrow based. Gait shows normal stride length and normal pace. No problems turning are noted. Tandem walk is unremarkable.             Assessment and Plan:  In summary, Benjamine SpragueDonald Noorani is a very pleasant 62 y.o.-year old male with an underlying medical history of hypertension, hyperlipidemia, prediabetes,  and vitamin D deficiency, whose history and physical exam are concerning for obstructive sleep apnea (OSA). I had a long chat with the patient about my findings and the diagnosis of OSA, its prognosis and treatment options. We talked about medical treatments, surgical interventions and non-pharmacological approaches. I explained in particular the risks and ramifications of untreated moderate to severe OSA, especially with respect to developing cardiovascular disease down the Road, including congestive heart failure, difficult to treat hypertension, cardiac arrhythmias, or stroke. Even type 2 diabetes has, in part, been linked to untreated OSA. Symptoms of untreated OSA include daytime sleepiness, memory problems, mood irritability and mood disorder such as depression and anxiety, lack of energy, as well as recurrent headaches, especially morning headaches. We talked about trying to maintain a healthy lifestyle in general, as well as the importance of weight control. I encouraged the patient to eat healthy, exercise daily and keep well hydrated, to keep a scheduled bedtime and wake time routine, to not skip any meals and eat healthy snacks in between meals. I advised the patient not to drive when feeling sleepy. I recommended the following at this time: sleep study  with potential positive airway pressure titration. (We will score hypopneas at 3%).   I explained the sleep test procedure to the patient and also outlined possible surgical and non-surgical treatment options of OSA, including the use of a custom-made dental device (which would require a referral to a specialist dentist or oral surgeon), upper airway surgical options, such as pillar implants, radiofrequency surgery, tongue base surgery, and UPPP (which would involve a referral to an ENT surgeon). Rarely, jaw surgery such as mandibular advancement may be considered.  I also explained the CPAP treatment option to the patient, who indicated that he  would be willing to try CPAP if the need arises. I explained the importance of being compliant with PAP treatment, not only for insurance purposes but primarily to improve His symptoms, and for the patient's long term health benefit, including to reduce His cardiovascular risks. I answered all his questions today and the patient was in agreement. I would like to see him back after the sleep study is completed and encouraged him to call with any interim questions, concerns, problems or updates.   Thank you very much for allowing me to participate in the care of this nice patient. If I can be of any further assistance to you please do not hesitate to call me at [phone].  Sincerely,   Huston FoleySaima Jerelle Virden, MD, PhD

## 2017-10-15 NOTE — Patient Instructions (Addendum)

## 2017-10-25 ENCOUNTER — Other Ambulatory Visit: Payer: Self-pay | Admitting: Internal Medicine

## 2017-11-11 ENCOUNTER — Ambulatory Visit (INDEPENDENT_AMBULATORY_CARE_PROVIDER_SITE_OTHER): Payer: BLUE CROSS/BLUE SHIELD | Admitting: Neurology

## 2017-11-11 DIAGNOSIS — G4719 Other hypersomnia: Secondary | ICD-10-CM

## 2017-11-11 DIAGNOSIS — G4761 Periodic limb movement disorder: Secondary | ICD-10-CM

## 2017-11-11 DIAGNOSIS — G4733 Obstructive sleep apnea (adult) (pediatric): Secondary | ICD-10-CM

## 2017-11-11 DIAGNOSIS — G4720 Circadian rhythm sleep disorder, unspecified type: Secondary | ICD-10-CM

## 2017-11-11 DIAGNOSIS — Z820 Family history of epilepsy and other diseases of the nervous system: Secondary | ICD-10-CM

## 2017-11-11 DIAGNOSIS — R0683 Snoring: Secondary | ICD-10-CM

## 2017-11-11 DIAGNOSIS — G2581 Restless legs syndrome: Secondary | ICD-10-CM

## 2017-11-11 DIAGNOSIS — R351 Nocturia: Secondary | ICD-10-CM

## 2017-11-11 DIAGNOSIS — R0681 Apnea, not elsewhere classified: Secondary | ICD-10-CM

## 2017-11-12 ENCOUNTER — Other Ambulatory Visit: Payer: Self-pay | Admitting: Physician Assistant

## 2017-11-12 ENCOUNTER — Other Ambulatory Visit: Payer: Self-pay | Admitting: Neurology

## 2017-11-12 ENCOUNTER — Telehealth: Payer: Self-pay

## 2017-11-12 DIAGNOSIS — G4761 Periodic limb movement disorder: Secondary | ICD-10-CM

## 2017-11-12 DIAGNOSIS — G4720 Circadian rhythm sleep disorder, unspecified type: Secondary | ICD-10-CM

## 2017-11-12 DIAGNOSIS — G2581 Restless legs syndrome: Secondary | ICD-10-CM

## 2017-11-12 DIAGNOSIS — G4733 Obstructive sleep apnea (adult) (pediatric): Secondary | ICD-10-CM

## 2017-11-12 NOTE — Procedures (Signed)
PATIENT'S NAME:  [name] [name], [name] DOB:      05/13/1956      MR#:    [id number]     DATE OF RECORDING: 11/11/2017 REFERRING M.D.:  Lucky CowboyWilliam McKeown, MD Study Performed:   Baseline Polysomnogram HISTORY: 62 year old man with a history of hypertension, hyperlipidemia, prediabetes, and vitamin D deficiency, who reports snoring and excessive daytime somnolence. He has been told that he has breathing pauses while asleep. He has woken himself up with a sense of gasping for air. He has a family history of OSA. The patient endorsed the Epworth Sleepiness Scale at 7 points. The patient's weight 174 pounds with a height of 70 (inches), resulting in a BMI of 24.9 kg/m2. The patient's neck circumference measured 16 inches.  CURRENT MEDICATIONS: Zocor, Diovan   PROCEDURE:  This is a multichannel digital polysomnogram utilizing the Somnostar 11.2 system.  Electrodes and sensors were applied and monitored per AASM Specifications.   EEG, EOG, Chin and Limb EMG, were sampled at 200 Hz.  ECG, Snore and Nasal Pressure, Thermal Airflow, Respiratory Effort, CPAP Flow and Pressure, Oximetry was sampled at 50 Hz. Digital video and audio were recorded.      BASELINE STUDY  Lights Out was at 21:56 and Lights On at 05:03.  Total recording time (TRT) was 428 minutes, with a total sleep time (TST) of  396.5 minutes.   The patient's sleep latency was 8.5 minutes.  REM latency was 88.5 minutes, which is normal. The sleep efficiency was 92.6 %.     SLEEP ARCHITECTURE: WASO (Wake after sleep onset) was 23 minutes with mild sleep fragmentation noted.  There were 11.5 minutes in Stage N1, 195.5 minutes Stage N2, 107 minutes Stage N3 and 82.5 minutes in Stage REM.  The percentage of Stage N1 was 2.9%, Stage N2 was 49.3%, which is normal, Stage N3 was 27.%, which is increased for age, and Stage R (REM sleep) was 20.8%, which is normal. The arousals were noted as: 16 were spontaneous, 6 were associated with PLMs, 111 were associated with  respiratory events.  Audio and video analysis did not show any abnormal or unusual movements, behaviors, phonations or vocalizations. The patient took no bathroom breaks. Mild to moderate snoring was noted. The EKG was in keeping with normal sinus rhythm (NSR).  RESPIRATORY ANALYSIS:  There were a total of 111 respiratory events:  86 obstructive apneas, 0 central apneas and 0 mixed apneas with a total of 86 apneas and an apnea index (AI) of 13. /hour. There were 25 hypopneas with a hypopnea index of 3.8 /hour. The patient also had 0 respiratory event related arousals (RERAs).      The total APNEA/HYPOPNEA INDEX (AHI) was 16.8/hour and the total RESPIRATORY DISTURBANCE INDEX was 16.8 /hour.  85 events occurred in REM sleep and 32 events in NREM. The REM AHI was 61.8 /hour, versus a non-REM AHI of 5.. The patient spent 107 minutes of total sleep time in the supine position and 290 minutes in non-supine.. The supine AHI was 58.3 versus a non-supine AHI of 1.5.  OXYGEN SATURATION & C02:  The Wake baseline 02 saturation was 97%, with the lowest being 76%. Time spent below 89% saturation equaled 52 minutes.  PERIODIC LIMB MOVEMENTS: The patient had a total of 78 Periodic Limb Movements.  The Periodic Limb Movement (PLM) index was 11.8 and the PLM Arousal index was .9/hour.  Post-study, the patient indicated that sleep was the same as usual.   IMPRESSION: 1. Obstructive Sleep Apnea (OSA)  2. Periodic Limb Movement Disorder (PLMD) 3. Dysfunctions associated with sleep stages or arousal from sleep  RECOMMENDATIONS:  1. This study demonstrates moderate to severe obstructive sleep apnea, with a total AHI of 16.8/hour, REM AHI of 61.8/hour, supine AHI of 58.3/hour and O2 nadir of 76%. Treatment with positive airway pressure in the form of CPAP is recommended. This will require a full night titration study to optimize therapy. Other treatment options may include avoidance of supine sleep position along with  weight loss, upper airway or jaw surgery in selected patients or the use of an oral appliance in certain patients. ENT evaluation and/or consultation with a maxillofacial surgeon or dentist may be feasible in some instances.    2. Please note that untreated obstructive sleep apnea carries additional perioperative morbidity. Patients with significant obstructive sleep apnea should receive perioperative PAP therapy and the surgeons and particularly the anesthesiologist should be informed of the diagnosis and the severity of the sleep disordered breathing. 3. Mild PLMs (periodic limb movements of sleep) were noted during this study with no significant arousals; clinical correlation is recommended. 4. This study shows mild sleep fragmentation and minimally abnormal sleep stage percentages; these are nonspecific findings and per se do not signify an intrinsic sleep disorder or a cause for the patient's sleep-related symptoms. Causes include (but are not limited to) the first night effect of the sleep study, circadian rhythm disturbances, medication effect or an underlying mood disorder or medical problem.  5. The patient should be cautioned not to drive, work at heights, or operate dangerous or heavy equipment when tired or sleepy. Review and reiteration of good sleep hygiene measures should be pursued with any patient. 6. The patient will be seen in follow-up by Dr. Frances Furbish at [facility] for discussion of the test results and further management strategies. The referring provider will be notified of the test results.  I certify that I have reviewed the entire raw data recording prior to the issuance of this report in accordance with the Standards of Accreditation of the American Academy of Sleep Medicine (AASM)   [name], MD, PhD Diplomat, American Board of Psychiatry and Neurology (Neurology and Sleep Medicine)

## 2017-11-12 NOTE — Telephone Encounter (Signed)
I called pt. I advised pt that Dr. Frances FurbishAthar reviewed their sleep study results and found that pt has moderate to severe osa with an O2 nadir of 76% and recommends that pt be treated with a cpap. Dr. Frances FurbishAthar recommends that pt return for a repeat sleep study in order to properly titrate the cpap and ensure a good mask fit. Pt is agreeable to returning for a titration study. I advised pt that our sleep lab will file with pt's insurance and call pt to schedule the sleep study when we hear back from the pt's insurance regarding coverage of this sleep study. Pt verbalized understanding of results. Pt had no questions at this time but was encouraged to call back if questions arise.

## 2017-11-12 NOTE — Progress Notes (Signed)
Patient referred by Oneta RackMcKeown, seen by me on 10/15/17, diagnostic PSG on 11/11/17.   Please call and notify the patient that the recent sleep study showed moderate to severe obstructive sleep apnea, with a total AHI of 16.8/hour, REM AHI of 61.8/hour, supine AHI of 58.3/hour and O2 nadir of 76%. I recommend treatment for this in the form of CPAP. This will require a repeat sleep study for proper titration and mask fitting and correct monitoring of the oxygen saturations. Please explain to patient. I have placed an order in the chart. Thanks.  Huston FoleySaima Ekansh Sherk, MD, PhD [facility])

## 2017-11-12 NOTE — Telephone Encounter (Signed)
-----   Message from Huston FoleySaima Athar, MD sent at 11/12/2017 10:04 AM EST ----- Patient referred by Oneta RackMcKeown, seen by me on 10/15/17, diagnostic PSG on 11/11/17.   Please call and notify the patient that the recent sleep study showed moderate to severe obstructive sleep apnea, with a total AHI of 16.8/hour, REM AHI of 61.8/hour, supine AHI of 58.3/hour and O2 nadir of 76%. I recommend treatment for this in the form of CPAP. This will require a repeat sleep study for proper titration and mask fitting and correct monitoring of the oxygen saturations. Please explain to patient. I have placed an order in the chart. Thanks.  Huston FoleySaima Athar, MD, PhD [facility])

## 2017-12-02 DIAGNOSIS — R7303 Prediabetes: Secondary | ICD-10-CM | POA: Insufficient documentation

## 2017-12-02 DIAGNOSIS — Z136 Encounter for screening for cardiovascular disorders: Secondary | ICD-10-CM

## 2017-12-02 DIAGNOSIS — E559 Vitamin D deficiency, unspecified: Secondary | ICD-10-CM | POA: Insufficient documentation

## 2017-12-02 DIAGNOSIS — Z6824 Body mass index (BMI) 24.0-24.9, adult: Secondary | ICD-10-CM

## 2017-12-02 DIAGNOSIS — Z6825 Body mass index (BMI) 25.0-25.9, adult: Secondary | ICD-10-CM | POA: Insufficient documentation

## 2017-12-02 DIAGNOSIS — G4733 Obstructive sleep apnea (adult) (pediatric): Secondary | ICD-10-CM | POA: Insufficient documentation

## 2017-12-02 NOTE — Progress Notes (Signed)
FOLLOW UP  Assessment and Plan:   Hypertension Well controlled with current medications  Monitor blood pressure at home; patient to call if consistently greater than 130/80 Continue DASH diet.   Reminder to go to the ER if any CP, SOB, nausea, dizziness, severe HA, changes vision/speech, left arm numbness and tingling and jaw pain.  Cholesterol Currently at goal; continue statin Continue low cholesterol diet and exercise.  Check lipid panel.   Prediabetes Continue diet and exercise.  Perform daily foot/skin check, notify office of any concerning changes.  Check A1C  BMI 24-25 Long discussion about diet, and exercise Recommended diet heavy in fruits and veggies and low in animal meats, cheeses, and dairy products, appropriate calorie intake Discussed ideal weight for height - trying to get down to 155 lb Will follow up in 3 months  Vitamin D Def Below goal at last visit; patient has not initiated supplement; continue to recommend supplementation for goal of 70-100- he is agreeable to starting 5000 U daily today Defer Vit D level  OSA CPAP recommended; follow up as scheduled for fitment.   Continue diet and meds as discussed. Further disposition pending results of labs. Discussed med's effects and SE's.   Over 30 minutes of exam, counseling, chart review, and critical decision making was performed.   Future Appointments  Date Time Provider Department Center  12/14/2017  9:30 PM [facility] None  03/04/2018  2:30 PM [name], MD [facility] None  10/12/2018  2:00 PM [name], MD [facility] None    ----------------------------------------------------------------------------------------------------------------------  HPI 62 y.o. male  presents for 3 month follow up on hypertension, cholesterol, diabetes, weight and vitamin D deficiency. He recently in January of 2019 completed a sleep study which demonstrated moderate to severe obstructive sleep  apnea, with a total AHI of 16.8/hour, REM AHI of 61.8/hour, supine AHI of 58.3/hour and O2 nadir of 76%. He was recommended treatment for this in the form of CPAP by Dr. [name]. Scheduled for CPAP fitting on 3/3.   BMI is Body mass index is 23.24 kg/m., he has been working on diet and exercise. He has been following a low carb diet.  Wt Readings from Last 3 Encounters:  12/03/17 162 lb (73.5 kg)  10/15/17 174 lb (78.9 kg)  09/02/17 171 lb 3.2 oz (77.7 kg)   His blood pressure has been controlled at home, today their BP is BP: 136/80  He does workout. He denies chest pain, shortness of breath, dizziness.   He is on cholesterol medication and denies myalgias. His cholesterol is at goal. The cholesterol last visit was:   Lab Results  Component Value Date   CHOL 140 09/02/2017   HDL 42 09/02/2017   LDLCALC 96 12/05/2016   TRIG 140 09/02/2017   CHOLHDL 3.3 09/02/2017    He has been working on diet and exercise for prediabetes, and denies increased appetite, nausea, paresthesia of the feet, polydipsia, polyuria, visual disturbances, vomiting and weight loss. Last A1C in the office was:  Lab Results  Component Value Date   HGBA1C 5.8 (H) 09/02/2017   Patient is not on Vitamin D supplement despite recommendation and is well below goal of 70:    Lab Results  Component Value Date   VD25OH 34 09/02/2017        Current Medications:  Current Outpatient Medications on File Prior to Visit  Medication Sig  . simvastatin (ZOCOR) 40 MG tablet TAKE 1 TABLET BY MOUTH EVERY DAY  . valsartan (DIOVAN)  80 MG tablet TAKE 1 TABLET BY MOUTH EVERY DAY   No current facility-administered medications on file prior to visit.      Allergies: No Known Allergies   Medical History:  Past Medical History:  Diagnosis Date  . Heart murmur   . Hyperlipidemia   . Hypertension   . Shingles   . Vitamin D deficiency    Family history- Reviewed and unchanged Social history- Reviewed and  unchanged   Review of Systems:  Review of Systems  Constitutional: Negative for malaise/fatigue and weight loss.  HENT: Negative for hearing loss and tinnitus.   Eyes: Negative for blurred vision and double vision.  Respiratory: Negative for cough, shortness of breath and wheezing.   Cardiovascular: Negative for chest pain, palpitations, orthopnea, claudication and leg swelling.  Gastrointestinal: Negative for abdominal pain, blood in stool, constipation, diarrhea, heartburn, melena, nausea and vomiting.  Genitourinary: Negative.   Musculoskeletal: Negative for joint pain and myalgias.  Skin: Negative for rash.  Neurological: Negative for dizziness, tingling, sensory change, weakness and headaches.  Endo/Heme/Allergies: Negative for polydipsia.  Psychiatric/Behavioral: Negative.   All other systems reviewed and are negative.   Physical Exam: BP 136/80   Pulse 85   Temp (!) 97.3 F (36.3 C)   Ht 5\' 10"  (1.778 m)   Wt 162 lb (73.5 kg)   SpO2 97%   BMI 23.24 kg/m  Wt Readings from Last 3 Encounters:  12/03/17 162 lb (73.5 kg)  10/15/17 174 lb (78.9 kg)  09/02/17 171 lb 3.2 oz (77.7 kg)   General Appearance: Well nourished, in no apparent distress. Eyes: PERRLA, EOMs, conjunctiva no swelling or erythema Sinuses: No Frontal/maxillary tenderness ENT/Mouth: Ext aud canals clear, TMs without erythema, bulging. No erythema, swelling, or exudate on post pharynx.  Tonsils not swollen or erythematous. Hearing normal.  Neck: Supple, thyroid normal.  Respiratory: Respiratory effort normal, BS equal bilaterally without rales, rhonchi, wheezing or stridor.  Cardio: RRR with no MRGs. Brisk peripheral pulses without edema.  Abdomen: Soft, + BS.  Non tender, no guarding, rebound, hernias, masses. Lymphatics: Non tender without lymphadenopathy.  Musculoskeletal: Full ROM, 5/5 strength, Normal gait Skin: Warm, dry without rashes, lesions, ecchymosis.  Neuro: Cranial nerves intact. No  cerebellar symptoms.  Psych: Awake and oriented X 3, normal affect, Insight and Judgment appropriate.    Dan MakerAshley C Zeda Gangwer, NP 3:51 PM [facility]

## 2017-12-03 ENCOUNTER — Encounter: Payer: Self-pay | Admitting: Adult Health

## 2017-12-03 ENCOUNTER — Ambulatory Visit (INDEPENDENT_AMBULATORY_CARE_PROVIDER_SITE_OTHER): Payer: BLUE CROSS/BLUE SHIELD | Admitting: Adult Health

## 2017-12-03 VITALS — BP 136/80 | HR 85 | Temp 97.3°F | Ht 70.0 in | Wt 162.0 lb

## 2017-12-03 DIAGNOSIS — Z79899 Other long term (current) drug therapy: Secondary | ICD-10-CM

## 2017-12-03 DIAGNOSIS — I10 Essential (primary) hypertension: Secondary | ICD-10-CM | POA: Diagnosis not present

## 2017-12-03 DIAGNOSIS — Z6824 Body mass index (BMI) 24.0-24.9, adult: Secondary | ICD-10-CM

## 2017-12-03 DIAGNOSIS — E559 Vitamin D deficiency, unspecified: Secondary | ICD-10-CM | POA: Diagnosis not present

## 2017-12-03 DIAGNOSIS — E782 Mixed hyperlipidemia: Secondary | ICD-10-CM

## 2017-12-03 DIAGNOSIS — R7303 Prediabetes: Secondary | ICD-10-CM

## 2017-12-03 DIAGNOSIS — G4733 Obstructive sleep apnea (adult) (pediatric): Secondary | ICD-10-CM

## 2017-12-03 NOTE — Patient Instructions (Addendum)
[employer] Free Hearing Test with no obligation # [phone] Do not have to be a member Tues-Sat 10-6  [facility]- free test with no obligation # [phone] MUST BE A MEMBER Call for store hours  Have had patient's get good cheaper hearing aids from [facility] The air version has good reviews.    Vitamin D goal is above 70-  Please make sure that you are taking your Vitamin D as directed.   It is very important as a natural anti-inflammatory   helping hair, skin, and nails, as well as reducing stroke and heart attack risk.   It helps your bones and helps with mood.  We want you on at least 5000 IU daily  It also decreases numerous cancer risks so please take it as directed.   Low Vit D is associated with a 200-300% higher risk for CANCER   and 200-300% higher risk for HEART   ATTACK  &  STROKE.    .....................................Marland Kitchen.  It is also associated with higher death rate at younger ages,   autoimmune diseases like Rheumatoid arthritis, Lupus, Multiple Sclerosis.     Also many other serious conditions, like depression, Alzheimer's  Dementia, infertility, muscle aches, fatigue, fibromyalgia - just to name a few.  +++++++++++++++++++  Can get liquid vitamin D from [employer]  OR [place] at  [facility] [address], [place], [place] [id number] Or you can try [employer]

## 2017-12-04 LAB — CBC WITH DIFFERENTIAL/PLATELET
BASOS PCT: 1.4 %
Basophils Absolute: 90 cells/uL (ref 0–200)
EOS PCT: 7.1 %
Eosinophils Absolute: 454 cells/uL (ref 15–500)
HCT: 38 % — ABNORMAL LOW (ref 38.5–50.0)
HEMOGLOBIN: 12.8 g/dL — AB (ref 13.2–17.1)
LYMPHS ABS: 2010 {cells}/uL (ref 850–3900)
MCH: 29.3 pg (ref 27.0–33.0)
MCHC: 33.7 g/dL (ref 32.0–36.0)
MCV: 87 fL (ref 80.0–100.0)
MONOS PCT: 8 %
MPV: 11.6 fL (ref 7.5–12.5)
NEUTROS ABS: 3334 {cells}/uL (ref 1500–7800)
Neutrophils Relative %: 52.1 %
Platelets: 224 10*3/uL (ref 140–400)
RBC: 4.37 10*6/uL (ref 4.20–5.80)
RDW: 12.5 % (ref 11.0–15.0)
Total Lymphocyte: 31.4 %
WBC mixed population: 512 cells/uL (ref 200–950)
WBC: 6.4 10*3/uL (ref 3.8–10.8)

## 2017-12-04 LAB — HEPATIC FUNCTION PANEL
AG Ratio: 2 (calc) (ref 1.0–2.5)
ALKALINE PHOSPHATASE (APISO): 65 U/L (ref 40–115)
ALT: 10 U/L (ref 9–46)
AST: 14 U/L (ref 10–35)
Albumin: 4.3 g/dL (ref 3.6–5.1)
BILIRUBIN DIRECT: 0.1 mg/dL (ref 0.0–0.2)
BILIRUBIN INDIRECT: 0.4 mg/dL (ref 0.2–1.2)
BILIRUBIN TOTAL: 0.5 mg/dL (ref 0.2–1.2)
Globulin: 2.2 g/dL (calc) (ref 1.9–3.7)
Total Protein: 6.5 g/dL (ref 6.1–8.1)

## 2017-12-04 LAB — BASIC METABOLIC PANEL WITH GFR
BUN: 15 mg/dL (ref 7–25)
CHLORIDE: 105 mmol/L (ref 98–110)
CO2: 30 mmol/L (ref 20–32)
Calcium: 9.4 mg/dL (ref 8.6–10.3)
Creat: 0.89 mg/dL (ref 0.70–1.25)
GFR, EST AFRICAN AMERICAN: 105 mL/min/{1.73_m2} (ref >=60)
GFR, EST NON AFRICAN AMERICAN: 90 mL/min/{1.73_m2} (ref >=60)
Glucose, Bld: 88 mg/dL (ref 65–99)
POTASSIUM: 4.1 mmol/L (ref 3.5–5.3)
SODIUM: 142 mmol/L (ref 135–146)

## 2017-12-04 LAB — LIPID PANEL
CHOL/HDL RATIO: 3.4 (calc) (ref <5.0)
Cholesterol: 132 mg/dL (ref <200)
HDL: 39 mg/dL — ABNORMAL LOW (ref >40)
LDL CHOLESTEROL (CALC): 72 mg/dL
NON-HDL CHOLESTEROL (CALC): 93 mg/dL (ref <130)
TRIGLYCERIDES: 125 mg/dL (ref <150)

## 2017-12-04 LAB — HEMOGLOBIN A1C
Hgb A1c MFr Bld: 5.8 % of total Hgb — ABNORMAL HIGH (ref <5.7)
Mean Plasma Glucose: 120 (calc)
eAG (mmol/L): 6.6 (calc)

## 2017-12-04 LAB — TSH: TSH: 2.04 m[IU]/L (ref 0.40–4.50)

## 2018-01-11 ENCOUNTER — Ambulatory Visit (INDEPENDENT_AMBULATORY_CARE_PROVIDER_SITE_OTHER): Payer: BLUE CROSS/BLUE SHIELD | Admitting: Neurology

## 2018-01-11 DIAGNOSIS — G4720 Circadian rhythm sleep disorder, unspecified type: Secondary | ICD-10-CM

## 2018-01-11 DIAGNOSIS — G4733 Obstructive sleep apnea (adult) (pediatric): Secondary | ICD-10-CM | POA: Diagnosis not present

## 2018-01-11 DIAGNOSIS — G2581 Restless legs syndrome: Secondary | ICD-10-CM

## 2018-01-11 DIAGNOSIS — G4761 Periodic limb movement disorder: Secondary | ICD-10-CM

## 2018-01-13 ENCOUNTER — Telehealth: Payer: Self-pay

## 2018-01-13 NOTE — Telephone Encounter (Signed)
I called pt to discuss his sleep study results. No answer, left a message asking him to call me back. 

## 2018-01-13 NOTE — Progress Notes (Signed)
c 

## 2018-01-13 NOTE — Addendum Note (Signed)
Addended by: Huston FoleyATHAR, Tobey Lippard on: 01/13/2018 08:36 AM   Modules accepted: Orders

## 2018-01-13 NOTE — Progress Notes (Signed)
Patient referred by Dr. Oneta RackMcKeown, seen by me on 10/15/17, diagnostic PSG on 11/11/17.   Patient had a CPAP titration study on 01/11/18.  Please call and inform patient that I have entered an order for treatment with positive airway pressure (PAP) treatment for obstructive sleep apnea (OSA). He did well during the latest sleep study with CPAP. We will, therefore, arrange for a machine for home use through a DME (durable medical equipment) company of His choice; and I will see the patient back in follow-up in about 10 weeks. Please also explain to the patient that I will be looking out for compliance data, which can be downloaded from the machine (stored on an SD card, that is inserted in the machine) or via remote access through a modem, that is built into the machine. At the time of the followup appointment we will discuss sleep study results and how it is going with PAP treatment at home. Please advise patient to bring His machine at the time of the first FU visit, even though this is cumbersome. Bringing the machine for every visit after that will likely not be needed, but often helps for the first visit to troubleshoot if needed. Please re-enforce the importance of compliance with treatment and the need for us to monitor compliance data - often an insurance requirement and actually good feedback for the patient as far as how they are doing.  Also remind patient, that any interim PAP machine or mask issues should be first addressed with the DME company, as they can often help better with technical and mask fit issues. Please ask if patient has a preference regarding DME company.  Please also make sure, the patient has a follow-up appointment with me in about 10 weeks from the setup date, thanks. May see one of our nurse practitioners if needed for proper timing of the FU appointment.  Please fax or rout report to the referring provider. Thanks,   Huston FoleySaima Pablo Stauffer, MD, PhD [facility])

## 2018-01-13 NOTE — Telephone Encounter (Signed)
I called pt. I advised pt that Dr. Frances FurbishAthar reviewed their sleep study results and found that pt pt did well with the cpap during his latest sleep study. Dr. Frances FurbishAthar recommends that pt start a cpap at home. I reviewed PAP compliance expectations with the pt. Pt is agreeable to starting a CPAP. I advised pt that an order will be sent to a DME, [employer], and [employer] will call the pt within about one week after they file with the pt's insurance. [facility] will show the pt how to use the machine, fit for masks, and troubleshoot the CPAP if needed. A follow up appt was made for insurance purposes with Dr. Frances FurbishAthar on 04/28/18 at 2:00pm. Pt verbalized understanding to arrive 15 minutes early and bring their CPAP. A letter with all of this information in it will be sent to the pt's mychart account as a reminder. I verified with the pt that the address we have on file is correct. Pt verbalized understanding of results. Pt had no questions at this time but was encouraged to call back if questions arise.

## 2018-01-13 NOTE — Telephone Encounter (Signed)
-----   Message from Huston FoleySaima Athar, MD sent at 01/13/2018  8:36 AM EDT ----- Patient referred by Dr. Oneta RackMcKeown, seen by me on 10/15/17, diagnostic PSG on 11/11/17.   Patient had a CPAP titration study on 01/11/18.  Please call and inform patient that I have entered an order for treatment with positive airway pressure (PAP) treatment for obstructive sleep apnea (OSA). He did well during the latest sleep study with CPAP. We will, therefore, arrange for a machine for home use through a DME (durable medical equipment) company of His choice; and I will see the patient back in follow-up in about 10 weeks. Please also explain to the patient that I will be looking out for compliance data, which can be downloaded from the machine (stored on an SD card, that is inserted in the machine) or via remote access through a modem, that is built into the machine. At the time of the followup appointment we will discuss sleep study results and how it is going with PAP treatment at home. Please advise patient to bring His machine at the time of the first FU visit, even though this is cumbersome. Bringing the machine for every visit after that will likely not be needed, but often helps for the first visit to troubleshoot if needed. Please re-enforce the importance of compliance with treatment and the need for us to monitor compliance data - often an insurance requirement and actually good feedback for the patient as far as how they are doing.  Also remind patient, that any interim PAP machine or mask issues should be first addressed with the DME company, as they can often help better with technical and mask fit issues. Please ask if patient has a preference regarding DME company.  Please also make sure, the patient has a follow-up appointment with me in about 10 weeks from the setup date, thanks. May see one of our nurse practitioners if needed for proper timing of the FU appointment.  Please fax or rout report to the referring provider.  Thanks,   Huston FoleySaima Athar, MD, PhD [facility])

## 2018-01-13 NOTE — Procedures (Signed)
PATIENT'S NAME:  [name] [name], [name] [name] DOB:      01/19/1956      MR#:    [id number]     DATE OF RECORDING: 01/11/2018 REFERRING M.D.:  Lucky CowboyWilliam McKeown, MD Study Performed:   CPAP  Titration HISTORY: 62 year old right-handed gentleman with an underlying medical history of hypertension, hyperlipidemia, prediabetes, and vitamin D deficiency, who returns for a CPAP titration for his OSA. His baseline PSG from 11/11/17 demonstrated moderate to severe obstructive sleep apnea, with a total AHI of 16.8/hour, REM AHI of 61.8/hour, supine AHI of 58.3/hour and O2 nadir of 76%. The patient endorsed the Epworth Sleepiness Scale at 7/24 points. The patient's weight 174 pounds with a height of 70 (inches), resulting in a BMI of 24.9 kg/m2. The patient's neck circumference measured 16 inches.  CURRENT MEDICATIONS: Zocor, Diovan  PROCEDURE:  This is a multichannel digital polysomnogram utilizing the SomnoStar 11.2 system.  Electrodes and sensors were applied and monitored per AASM Specifications.   EEG, EOG, Chin and Limb EMG, were sampled at 200 Hz.  ECG, Snore and Nasal Pressure, Thermal Airflow, Respiratory Effort, CPAP Flow and Pressure, Oximetry was sampled at 50 Hz. Digital video and audio were recorded.      The patient was fitted with a medium Simplus FFM, after trying 2 other masks. CPAP was initiated at 5 cmH20 with heated humidity per AASM standards and pressure was advanced to 10 cmH20 because of hypopneas, apneas and desaturations.  At a PAP pressure of 10 cmH20, there was a reduction of the AHI to 0 with non-supine REM sleep achieved and O2 nadir of 93%.   Lights Out was at 22:22 and Lights On at 05:01. Total recording time (TRT) was 399.5 minutes, with a total sleep time (TST) of 255 minutes. The patient's sleep latency was 27.5 minutes. REM latency was 174 minutes, which is delayed. The sleep efficiency was 63.8%, which is reduced.    SLEEP ARCHITECTURE: WASO (Wake after sleep onset) was 117 minutes with  moderate sleep fragmentation noted. There were 29.5 minutes in Stage N1, 166 minutes Stage N2, 0 minutes Stage N3 and 59.5 minutes in Stage REM.  The percentage of Stage N1 was 11.6%, which is increased, Stage N2 was 65.1%, which is increased, Stage N3 was absent, and Stage R (REM sleep) was 23.3%, which is normal.  The arousals were noted as: 40 were spontaneous, 0 were associated with PLMs, 5 were associated with respiratory events.  Audio and video analysis did not show any abnormal or unusual movements, behaviors, phonations or vocalizations. The patient took no bathroom breaks. The EKG was in keeping with normal sinus rhythm (NSR).  RESPIRATORY ANALYSIS:  There was a total of 6 respiratory events: 0 obstructive apneas, 0 central apneas and 0 mixed apneas with a total of 0 apneas and an apnea index (AI) of 0 /hour. There were 6 hypopneas with a hypopnea index of 1.4/hour. The patient also had 0 respiratory event related arousals (RERAs).      The total APNEA/HYPOPNEA INDEX  (AHI) was 1.4 /hour and the total RESPIRATORY DISTURBANCE INDEX was 1.4 .hour  1 events occurred in REM sleep and 5 events in NREM. The REM AHI was 1. /hour versus a non-REM AHI of 1.5 /hour.  The patient spent 114 minutes of total sleep time in the supine position and 141 minutes in non-supine. The supine AHI was 3.2, versus a non-supine AHI of 0.0.  OXYGEN SATURATION & C02:  The baseline 02 saturation was 95%, with the lowest  being 92%. Time spent below 89% saturation equaled 0 minutes.  PERIODIC LIMB MOVEMENTS: The patient had a total of 0 Periodic Limb Movements. The Periodic Limb Movement (PLM) index was 0 and the PLM Arousal index was 0 /hour.  Post-study, the patient indicated that sleep was better than usual.   IMPRESSION: 1. Obstructive Sleep Apnea (OSA) 2. Dysfunctions associated with sleep stages or arousal from sleep   RECOMMENDATIONS:   1. This study demonstrates resolution of the patient's obstructive sleep  apnea with CPAP therapy. I will, therefore, start the patient on home CPAP treatment at a pressure of 10 cm via medium full face mask with heated humidity. The patient should be reminded to be fully compliant with PAP therapy to improve sleep related symptoms and decrease long term cardiovascular risks. The patient should be reminded, that it may take up to 3 months to get fully used to using PAP with all planned sleep. The earlier full compliance is achieved, the better long term compliance tends to be. Please note that untreated obstructive sleep apnea carries additional perioperative morbidity. Patients with significant obstructive sleep apnea should receive perioperative PAP therapy and the surgeons and particularly the anesthesiologist should be informed of the diagnosis and the severity of the sleep disordered breathing. 2. This study shows mild sleep fragmentation and minimally abnormal sleep stage percentages; these are nonspecific findings and per se do not signify an intrinsic sleep disorder or a cause for the patient's sleep-related symptoms. Causes include (but are not limited to) the first night effect of the sleep study, circadian rhythm disturbances, medication effect or an underlying mood disorder or medical problem.  3. The patient should be cautioned not to drive, work at heights, or operate dangerous or heavy equipment when tired or sleepy. Review and reiteration of good sleep hygiene measures should be pursued with any patient. 4. The patient will be seen in follow-up by Dr. Frances Furbish at [facility] for discussion of the test results and further management strategies. The referring provider will be notified of the test results.   I certify that I have reviewed the entire raw data recording prior to the issuance of this report in accordance with the Standards of Accreditation of the American Academy of Sleep Medicine (AASM)     [name], MD, PhD Diplomat, American Board of Psychiatry and Neurology  (Neurology and Sleep Medicine)

## 2018-01-29 ENCOUNTER — Other Ambulatory Visit: Payer: Self-pay | Admitting: Physician Assistant

## 2018-02-20 DIAGNOSIS — G4733 Obstructive sleep apnea (adult) (pediatric): Secondary | ICD-10-CM | POA: Diagnosis not present

## 2018-03-03 ENCOUNTER — Ambulatory Visit: Payer: Self-pay | Admitting: Internal Medicine

## 2018-03-04 ENCOUNTER — Ambulatory Visit: Payer: Self-pay | Admitting: Internal Medicine

## 2018-03-16 NOTE — Patient Instructions (Signed)

## 2018-03-16 NOTE — Progress Notes (Signed)
This very nice 62 y.o. DWM presents for 6 month follow up with HTN, HLD, Pre-Diabetes, and Vitamin D Deficiency. Patient recently dx'd with OSA & reports in introductory phase trying different mask with limited success and not yet feeling a benefit of CPAP.      Patient is treated for HTN (2004) & BP has been controlled at home. Today's BP is at goal 126/78. Patient has had no complaints of any cardiac type chest pain, palpitations, dyspnea / orthopnea / PND, dizziness, claudication, or dependent edema.     Hyperlipidemia is controlled with diet & meds. Patient denies myalgias or other med SE's. Last Lipids were at goal: Lab Results  Component Value Date   CHOL 132 12/03/2017   HDL 39 (L) 12/03/2017   LDLCALC 72 12/03/2017   TRIG 125 12/03/2017   CHOLHDL 3.4 12/03/2017      Also, the patient has history of PreDiabetes (A1c 6.0%/2015, then 6.5%/2016 and down to 5.9%/2017) and he has had no symptoms of reactive hypoglycemia, diabetic polys, paresthesias or visual blurring.  Last A1c was not at goal: Lab Results  Component Value Date   HGBA1C 5.8 (H) 12/03/2017      Further, the patient also has history of Vitamin D Deficiency ("35"/2009) and supplements vitamin D without any suspected side-effects. Last vitamin D was still low:  Lab Results  Component Value Date   VD25OH 34 09/02/2017   Current Outpatient Medications on File Prior to Visit  Medication Sig  . simvastatin  40 MG tablet TAKE 1 TAB EVERY DAY   LD bASA 81 mg EC tab TAKE 1 TAB EVERY DAY   Vitamin D 5,000 unit caps TAKE 1 TAB EVERY DAY  . valsartan  80 MG tablet TAKE 1 TAB EVERY DAY   No Known Allergies   PMHx:   Past Medical History:  Diagnosis Date  . Heart murmur   . Hyperlipidemia   . Hypertension   . Shingles   . Vitamin D deficiency    Immunization History  Administered Date(s) Administered  . Influenza Inj Mdck Quad With Preservative 09/02/2017  . PPD Test 09/02/2017  . Tdap 10/19/2008   Past  Surgical History:  Procedure Laterality Date  . INGUINAL HERNIA REPAIR Bilateral 09/28/2015   Procedure: LAPAROSCOPIC BILATERAL INGUINAL HERNIA REPAIR;  Surgeon: Gaynelle Adu, MD;  Location: [facility];  Service: General;  Laterality: Bilateral;  . INSERTION OF MESH Bilateral 09/28/2015   Procedure: INSERTION OF MESH;  Surgeon: Gaynelle Adu, MD;  Location: [facility];  Service: General;  Laterality: Bilateral;  . SHOULDER ARTHROSCOPY     FHx:    Reviewed / unchanged  SHx:    Reviewed / unchanged   Systems Review:  Constitutional: Denies fever, chills, wt changes, headaches, insomnia, fatigue, night sweats, change in appetite. Eyes: Denies redness, blurred vision, diplopia, discharge, itchy, watery eyes.  ENT: Denies discharge, congestion, post nasal drip, epistaxis, sore throat, earache, hearing loss, dental pain, tinnitus, vertigo, sinus pain, snoring.  CV: Denies chest pain, palpitations, irregular heartbeat, syncope, dyspnea, diaphoresis, orthopnea, PND, claudication or edema. Respiratory: denies cough, dyspnea, DOE, pleurisy, hoarseness, laryngitis, wheezing.  Gastrointestinal: Denies dysphagia, odynophagia, heartburn, reflux, water brash, abdominal pain or cramps, nausea, vomiting, bloating, diarrhea, constipation, hematemesis, melena, hematochezia  or hemorrhoids. Genitourinary: Denies dysuria, frequency, urgency, nocturia, hesitancy, discharge, hematuria or flank pain. Musculoskeletal: Denies arthralgias, myalgias, stiffness, jt. swelling, pain, limping or strain/sprain.  Skin: Denies pruritus, rash, hives, warts, acne, eczema or change in skin lesion(s). Neuro:  No weakness, tremor, incoordination, spasms, paresthesia or pain. Psychiatric: Denies confusion, memory loss or sensory loss. Endo: Denies change in weight, skin or hair change.  Heme/Lymph: No excessive bleeding, bruising or enlarged lymph nodes.  Physical Exam  BP 126/78   Pulse 88   Temp (!) 97.4 F (36.3 C)   Resp 16   Ht  5' 8.75" (1.746 m)   Wt 162 lb 6.4 oz (73.7 kg)   BMI 24.16 kg/m   Appears  well nourished, well groomed  and in no distress.  Eyes: PERRLA, EOMs, conjunctiva no swelling or erythema. Sinuses: No frontal/maxillary tenderness ENT/Mouth: EAC's clear, TM's nl w/o erythema, bulging. Nares clear w/o erythema, swelling, exudates. Oropharynx clear without erythema or exudates. Oral hygiene is good. Tongue normal, non obstructing. Hearing intact.  Neck: Supple. Thyroid not palpable. Car 2+/2+ without bruits, nodes or JVD. Chest: Respirations nl with BS clear & equal w/o rales, rhonchi, wheezing or stridor.  Cor: Heart sounds normal w/ regular rate and rhythm without sig. murmurs, gallops, clicks or rubs. Peripheral pulses normal and equal  without edema.  Abdomen: Soft & bowel sounds normal. Non-tender w/o guarding, rebound, hernias, masses or organomegaly.  Lymphatics: Unremarkable.  Musculoskeletal: Full ROM all peripheral extremities, joint stability, 5/5 strength and normal gait.  Skin: Warm, dry without exposed rashes, lesions or ecchymosis apparent.  Neuro: Cranial nerves intact, reflexes equal bilaterally. Sensory-motor testing grossly intact. Tendon reflexes grossly intact.  Pysch: Alert & oriented x 3.  Insight and judgement nl & appropriate. No ideations.  Assessment and Plan:  1. Essential hypertension, benign  - Continue medication, monitor blood pressure at home.  - Continue DASH diet.  Reminder to go to the ER if any CP,  SOB, nausea, dizziness, severe HA, changes vision/speech.  - CBC with Differential/Platelet - COMPLETE METABOLIC PANEL WITH GFR - Magnesium - TSH  2. Hyperlipidemia, mixed  - Continue diet/meds, exercise,& lifestyle modifications.  - Continue monitor periodic cholesterol/liver & renal functions   - Lipid panel - TSH  3. Prediabetes  - Continue diet, exercise, lifestyle modifications.  - Monitor appropriate labs.  - Hemoglobin A1c - Insulin,  random  4. Vitamin D deficiency  - Continue supplementation.   - VITAMIN D 25 Hydroxyl  5. OSA (obstructive sleep apnea)  6. Medication management  - CBC with Differential/Platelet - COMPLETE METABOLIC PANEL WITH GFR - Magnesium - Lipid panel - TSH - Hemoglobin A1c - Insulin, random - VITAMIN D 25 Hydroxyl        Discussed  regular exercise, BP monitoring, weight control to achieve/maintain BMI less than 25 and discussed med and SE's. Recommended labs to assess and monitor clinical status with further disposition pending results of labs. Over 30 minutes of exam, counseling, chart review was performed.

## 2018-03-17 ENCOUNTER — Ambulatory Visit (INDEPENDENT_AMBULATORY_CARE_PROVIDER_SITE_OTHER): Payer: BLUE CROSS/BLUE SHIELD | Admitting: Internal Medicine

## 2018-03-17 ENCOUNTER — Encounter: Payer: Self-pay | Admitting: Internal Medicine

## 2018-03-17 VITALS — BP 126/78 | HR 88 | Temp 97.4°F | Resp 16 | Ht 68.75 in | Wt 162.4 lb

## 2018-03-17 DIAGNOSIS — I10 Essential (primary) hypertension: Secondary | ICD-10-CM

## 2018-03-17 DIAGNOSIS — E782 Mixed hyperlipidemia: Secondary | ICD-10-CM | POA: Diagnosis not present

## 2018-03-17 DIAGNOSIS — E291 Testicular hypofunction: Secondary | ICD-10-CM | POA: Diagnosis not present

## 2018-03-17 DIAGNOSIS — Z79899 Other long term (current) drug therapy: Secondary | ICD-10-CM

## 2018-03-17 DIAGNOSIS — E559 Vitamin D deficiency, unspecified: Secondary | ICD-10-CM

## 2018-03-17 DIAGNOSIS — R7303 Prediabetes: Secondary | ICD-10-CM | POA: Diagnosis not present

## 2018-03-17 DIAGNOSIS — G4733 Obstructive sleep apnea (adult) (pediatric): Secondary | ICD-10-CM

## 2018-03-17 MED ORDER — ASPIRIN EC 81 MG PO TBEC: DELAYED_RELEASE_TABLET | ORAL | 99 refills | Status: DC

## 2018-03-17 MED ORDER — CHOLECALCIFEROL 125 MCG (5000 UT) PO CAPS: 5000.0000 [IU] | ORAL_CAPSULE | Freq: Every day | ORAL | 99 refills | Status: DC

## 2018-03-18 LAB — COMPLETE METABOLIC PANEL WITH GFR
AG RATIO: 1.8 (calc) (ref 1.0–2.5)
ALT: 17 U/L (ref 9–46)
AST: 18 U/L (ref 10–35)
Albumin: 4.4 g/dL (ref 3.6–5.1)
Alkaline phosphatase (APISO): 74 U/L (ref 40–115)
BUN: 13 mg/dL (ref 7–25)
CALCIUM: 9.1 mg/dL (ref 8.6–10.3)
CO2: 28 mmol/L (ref 20–32)
CREATININE: 0.81 mg/dL (ref 0.70–1.25)
Chloride: 105 mmol/L (ref 98–110)
GFR, EST AFRICAN AMERICAN: 109 mL/min/{1.73_m2} (ref >=60)
GFR, EST NON AFRICAN AMERICAN: 94 mL/min/{1.73_m2} (ref >=60)
GLOBULIN: 2.4 g/dL (ref 1.9–3.7)
Glucose, Bld: 91 mg/dL (ref 65–99)
POTASSIUM: 4.2 mmol/L (ref 3.5–5.3)
Sodium: 141 mmol/L (ref 135–146)
TOTAL PROTEIN: 6.8 g/dL (ref 6.1–8.1)
Total Bilirubin: 0.3 mg/dL (ref 0.2–1.2)

## 2018-03-18 LAB — CBC WITH DIFFERENTIAL/PLATELET
BASOS PCT: 1 %
Basophils Absolute: 76 cells/uL (ref 0–200)
EOS ABS: 578 {cells}/uL — AB (ref 15–500)
EOS PCT: 7.6 %
HEMATOCRIT: 35.9 % — AB (ref 38.5–50.0)
HEMOGLOBIN: 12.4 g/dL — AB (ref 13.2–17.1)
LYMPHS ABS: 1968 {cells}/uL (ref 850–3900)
MCH: 29.7 pg (ref 27.0–33.0)
MCHC: 34.5 g/dL (ref 32.0–36.0)
MCV: 86.1 fL (ref 80.0–100.0)
MONOS PCT: 8.1 %
MPV: 11.5 fL (ref 7.5–12.5)
NEUTROS ABS: 4362 {cells}/uL (ref 1500–7800)
Neutrophils Relative %: 57.4 %
Platelets: 227 10*3/uL (ref 140–400)
RBC: 4.17 10*6/uL — ABNORMAL LOW (ref 4.20–5.80)
RDW: 12.5 % (ref 11.0–15.0)
Total Lymphocyte: 25.9 %
WBC mixed population: 616 cells/uL (ref 200–950)
WBC: 7.6 10*3/uL (ref 3.8–10.8)

## 2018-03-18 LAB — LIPID PANEL
CHOL/HDL RATIO: 3.3 (calc) (ref <5.0)
CHOLESTEROL: 154 mg/dL (ref <200)
HDL: 47 mg/dL (ref >40)
LDL CHOLESTEROL (CALC): 86 mg/dL
Non-HDL Cholesterol (Calc): 107 mg/dL (calc) (ref <130)
TRIGLYCERIDES: 115 mg/dL (ref <150)

## 2018-03-18 LAB — TSH: TSH: 3.43 mIU/L (ref 0.40–4.50)

## 2018-03-18 LAB — MAGNESIUM: Magnesium: 1.7 mg/dL (ref 1.5–2.5)

## 2018-03-18 LAB — HEMOGLOBIN A1C
Hgb A1c MFr Bld: 5.9 % of total Hgb — ABNORMAL HIGH (ref <5.7)
MEAN PLASMA GLUCOSE: 123 (calc)
eAG (mmol/L): 6.8 (calc)

## 2018-03-18 LAB — VITAMIN D 25 HYDROXY (VIT D DEFICIENCY, FRACTURES): VIT D 25 HYDROXY: 49 ng/mL (ref 30–100)

## 2018-03-18 LAB — INSULIN, RANDOM: Insulin: 11.9 u[IU]/mL (ref 2.0–19.6)

## 2018-03-23 DIAGNOSIS — G4733 Obstructive sleep apnea (adult) (pediatric): Secondary | ICD-10-CM | POA: Diagnosis not present

## 2018-04-22 DIAGNOSIS — G4733 Obstructive sleep apnea (adult) (pediatric): Secondary | ICD-10-CM | POA: Diagnosis not present

## 2018-04-23 ENCOUNTER — Telehealth: Payer: Self-pay | Admitting: Neurology

## 2018-04-23 NOTE — Telephone Encounter (Signed)
FYI Pt called to cancel or reschedule his initial CPAP f/u.  Dr Frances FurbishAthar did not have anything that would have allowed him to be seen within 30-90 days.  Pt accepted a f/u appointment of 08-22 @1 :30(check in 1:00) with NP Megan.  No call back requested

## 2018-04-23 NOTE — Telephone Encounter (Signed)
Spoke with Baird Lyonsasey, RN. His set up date for CPAP was 02/20/18. He has [employer] and f/u for 06/04/18 will be okay to keep for initial f/u.

## 2018-04-28 ENCOUNTER — Ambulatory Visit: Payer: Self-pay | Admitting: Neurology

## 2018-05-10 ENCOUNTER — Other Ambulatory Visit: Payer: Self-pay | Admitting: Internal Medicine

## 2018-05-23 DIAGNOSIS — G4733 Obstructive sleep apnea (adult) (pediatric): Secondary | ICD-10-CM | POA: Diagnosis not present

## 2018-06-01 ENCOUNTER — Telehealth: Payer: Self-pay | Admitting: Neurology

## 2018-06-01 NOTE — Telephone Encounter (Signed)
Pt called needing to r/s his appt for 8/22 with NP for new cpap user due to a death in the family. Pt requesting a call back to see if there is anyway to work him in within the timely manner. Scheduled pt for next open appt between NP and MD. Please call to advise

## 2018-06-01 NOTE — Telephone Encounter (Signed)
I called pt. He will be unavailable this week. He is asking for an appt on 06/10/18. I advised him that right now we don't have any openings on 06/10/18 but that I will watch for cancellations. Pt verbalized understanding.

## 2018-06-04 ENCOUNTER — Ambulatory Visit: Payer: BLUE CROSS/BLUE SHIELD | Admitting: Adult Health

## 2018-06-04 NOTE — Telephone Encounter (Signed)
I called pt. Aundra MilletMegan, NP now has an opening on 06/08/18 at 7:30am. I offered this appt to the pt and pt is agreeable to this appt and knows to check in at 7:00am. Pt verbalized understanding of new appt date and time.

## 2018-06-08 ENCOUNTER — Other Ambulatory Visit: Payer: Self-pay | Admitting: Physician Assistant

## 2018-06-08 ENCOUNTER — Ambulatory Visit (INDEPENDENT_AMBULATORY_CARE_PROVIDER_SITE_OTHER): Payer: BLUE CROSS/BLUE SHIELD | Admitting: Adult Health

## 2018-06-08 ENCOUNTER — Encounter: Payer: Self-pay | Admitting: Adult Health

## 2018-06-08 VITALS — BP 114/68 | HR 82 | Ht 68.75 in | Wt 167.0 lb

## 2018-06-08 DIAGNOSIS — Z9989 Dependence on other enabling machines and devices: Secondary | ICD-10-CM | POA: Diagnosis not present

## 2018-06-08 DIAGNOSIS — G4733 Obstructive sleep apnea (adult) (pediatric): Secondary | ICD-10-CM | POA: Diagnosis not present

## 2018-06-08 NOTE — Progress Notes (Addendum)
PATIENT: [name] [name] DOB: [birth year]  REASON FOR VISIT: follow up HISTORY FROM: patient  HISTORY OF PRESENT ILLNESS: Today 06/08/18:  Mr. [name] [name] is a 62 year old male with a history of obstructive sleep apnea on CPAP.  His CPAP download indicates that he does not use machine much in the last 30 days.  He reports that he went on a cruise and when he returned he did not get back into the routine of using the machine.  However a 90-day download indicated that he used his machine 54 days for compliance of 60%.  He uses machine greater than 4 hours 40 days for compliance of 44%.  On average he uses machine 4 hours and 49 minutes.  His residual AHI is 0.7 on 10 cm of water with EPR 3.  His leak in the 95th percentile is 26.3 L/min.  The patient states that he was having trouble with a leak but he was able to get a new mask and that has resolved a week according to the patient.  He returns today for evaluation.  HISTORY Mr. [name] [name] is a 62 year old right-handed gentleman with an underlying medical history of hypertension, hyperlipidemia, prediabetes, and vitamin D deficiency, who reports snoring and excessive daytime somnolence. He has been told that he has breathing pauses while asleep. He has woken himself up with a sense of gasping for air. His brother has sleep apnea and uses a CPAP machine. I reviewed your office note from 09/02/2017. His Epworth sleepiness score is 7 out of 24 today, fatigue score is 34 out of 63. He is divorced and lives with his son. He has 3 grown children. He is a nonsmoker and does not utilize alcohol, does not drink caffeine on a daily basis. He does not wake up rested. His bedtime is around 8 and wake up time generally between 4 and 4:30. This has been his pattern for years. He does not have prolonged mental of the night awakenings. He does have occasional restless leg symptoms which can be quite significant at times but, and go. He prefers to sleep on his sides. He  would prefer a CPAP trial over an oral appliance or surgical intervention for sleep apnea if the need arises. He would be interested in coming back for a sleep study. He has a history of heart murmur for which he had testing before. He had an echocardiogram in July 2015 which was reasonably benign, showed tricuspid regurgitation which would explain his systolic murmur.  REVIEW OF SYSTEMS: Out of a complete 14 system review of symptoms, the patient complains only of the following symptoms, and all other reviewed systems are negative.  Morning, cough,   ALLERGIES: No Known Allergies  HOME MEDICATIONS: Outpatient Medications Prior to Visit  Medication Sig Dispense Refill  . aspirin EC 81 MG tablet Take 1 tablet daily 1000 tablet 99  . Cholecalciferol 5000 units capsule Take 1 capsule (5,000 Units total) by mouth daily. 1000 capsule 99  . simvastatin (ZOCOR) 40 MG tablet TAKE 1 TABLET BY MOUTH EVERY DAY 90 tablet 1  . valsartan (DIOVAN) 80 MG tablet TAKE 1 TABLET BY MOUTH EVERY DAY 90 tablet 0   No facility-administered medications prior to visit.     PAST MEDICAL HISTORY: Past Medical History:  Diagnosis Date  . Heart murmur   . Hyperlipidemia   . Hypertension   . Shingles   . Vitamin D deficiency     PAST SURGICAL HISTORY: Past Surgical History:  Procedure Laterality Date  .  INGUINAL HERNIA REPAIR Bilateral 09/28/2015   Procedure: LAPAROSCOPIC BILATERAL INGUINAL HERNIA REPAIR;  Surgeon: Gaynelle Adu, MD;  Location: [facility];  Service: General;  Laterality: Bilateral;  . INSERTION OF MESH Bilateral 09/28/2015   Procedure: INSERTION OF MESH;  Surgeon: Gaynelle Adu, MD;  Location: [facility];  Service: General;  Laterality: Bilateral;  . SHOULDER ARTHROSCOPY      FAMILY HISTORY: Family History  Problem Relation Age of Onset  . Cancer Mother        colon, lung  . Heart disease Father   . Hyperlipidemia Father   . Heart attack Father     SOCIAL HISTORY: Social History    Socioeconomic History  . Marital status: Married    Spouse name: Not on file  . Number of children: Not on file  . Years of education: Not on file  . Highest education level: Not on file  Occupational History  . Not on file  Social Needs  . Financial resource strain: Not on file  . Food insecurity:    Worry: Not on file    Inability: Not on file  . Transportation needs:    Medical: Not on file    Non-medical: Not on file  Tobacco Use  . Smoking status: Never Smoker  . Smokeless tobacco: Never Used  Substance and Sexual Activity  . Alcohol use: Yes    Comment: OCCASIONAL BEER   . Drug use: No  . Sexual activity: Not on file  Lifestyle  . Physical activity:    Days per week: Not on file    Minutes per session: Not on file  . Stress: Not on file  Relationships  . Social connections:    Talks on phone: Not on file    Gets together: Not on file    Attends religious service: Not on file    Active member of club or organization: Not on file    Attends meetings of clubs or organizations: Not on file    Relationship status: Not on file  . Intimate partner violence:    Fear of current or ex partner: Not on file    Emotionally abused: Not on file    Physically abused: Not on file    Forced sexual activity: Not on file  Other Topics Concern  . Not on file  Social History Narrative  . Not on file      PHYSICAL EXAM  Vitals:   06/08/18 0716  BP: 114/68  Pulse: 82  Weight: 167 lb (75.8 kg)  Height: 5' 8.75" (1.746 m)   Body mass index is 24.84 kg/m.  Generalized: Well developed, in no acute distress   Neurological examination  Mentation: Alert oriented to time, place, history taking. Follows all commands speech and language fluent Cranial nerve II-XII:  Extraocular movements were full, visual field were full on confrontational test. Facial sensation and strength were normal. Uvula tongue midline. Head turning and shoulder shrug  were normal and symmetric.  Neck  circumference 15-1/2 inches Mallampati 4+ Motor: The motor testing reveals 5 over 5 strength of all 4 extremities. Good symmetric motor tone is noted throughout.  Sensory: Sensory testing is intact to soft touch on all 4 extremities. No evidence of extinction is noted.  Coordination: Cerebellar testing reveals good finger-nose-finger and heel-to-shin bilaterally.  Gait and station: Gait is normal.    DIAGNOSTIC DATA (LABS, IMAGING, TESTING) - I reviewed patient records, labs, notes, testing and imaging myself where available.  Lab Results  Component Value Date  WBC 7.6 03/17/2018   HGB 12.4 (L) 03/17/2018   HCT 35.9 (L) 03/17/2018   MCV 86.1 03/17/2018   PLT 227 03/17/2018      Component Value Date/Time   NA 141 03/17/2018 1553   K 4.2 03/17/2018 1553   CL 105 03/17/2018 1553   CO2 28 03/17/2018 1553   GLUCOSE 91 03/17/2018 1553   BUN 13 03/17/2018 1553   CREATININE 0.81 03/17/2018 1553   CALCIUM 9.1 03/17/2018 1553   PROT 6.8 03/17/2018 1553   ALBUMIN 4.3 12/05/2016 1014   AST 18 03/17/2018 1553   ALT 17 03/17/2018 1553   ALKPHOS 51 12/05/2016 1014   BILITOT 0.3 03/17/2018 1553   GFRNONAA 94 03/17/2018 1553   GFRAA 109 03/17/2018 1553   Lab Results  Component Value Date   CHOL 154 03/17/2018   HDL 47 03/17/2018   LDLCALC 86 03/17/2018   TRIG 115 03/17/2018   CHOLHDL 3.3 03/17/2018   Lab Results  Component Value Date   HGBA1C 5.9 (H) 03/17/2018   Lab Results  Component Value Date   VITAMINB12 385 09/02/2017   Lab Results  Component Value Date   TSH 3.43 03/17/2018      ASSESSMENT AND PLAN 62 y.o. year old male  has a past medical history of Heart murmur, Hyperlipidemia, Hypertension, Shingles, and Vitamin D deficiency. here with:  1.  Obstructive sleep apnea on CPAP  The patient was encouraged to use his CPAP nightly and greater than 4 hours each night.  I advised that if his symptoms worsen or he develops new symptoms he should let us know.  He will  follow-up in 6 months or sooner if needed.   I spent 15 minutes with the patient. 50% of this time was spent reviewing his CPAP download   Butch PennyMegan Galadriel Shroff, MSN, NP-C 06/08/2018, 7:38 AM [facility] [address], [address] [place], [place] [address] [phone]  I reviewed the above note and documentation by the Nurse Practitioner and agree with the history, physical exam, assessment and plan as outlined above. I was immediately available for face-to-face consultation. Huston FoleySaima Athar, MD, PhD [facility]

## 2018-06-08 NOTE — Patient Instructions (Signed)
Your Plan:  Use CPAP nightly and >4 hours each night If your symptoms worsen or you develop new symptoms please let us know.    Thank you for [facility] see [facility] at [facility]. I hope we have been able to provide you high quality care today.  You may receive a patient satisfaction survey over the next few weeks. We would appreciate your feedback and comments so that we may continue to improve ourselves and the health of our patients.

## 2018-06-23 DIAGNOSIS — G4733 Obstructive sleep apnea (adult) (pediatric): Secondary | ICD-10-CM | POA: Diagnosis not present

## 2018-06-30 ENCOUNTER — Ambulatory Visit: Payer: Self-pay | Admitting: Adult Health

## 2018-06-30 ENCOUNTER — Ambulatory Visit: Payer: Self-pay | Admitting: Physician Assistant

## 2018-07-22 ENCOUNTER — Ambulatory Visit: Payer: BLUE CROSS/BLUE SHIELD | Admitting: Neurology

## 2018-07-23 DIAGNOSIS — G4733 Obstructive sleep apnea (adult) (pediatric): Secondary | ICD-10-CM | POA: Diagnosis not present

## 2018-08-10 ENCOUNTER — Ambulatory Visit (INDEPENDENT_AMBULATORY_CARE_PROVIDER_SITE_OTHER): Payer: BLUE CROSS/BLUE SHIELD | Admitting: Adult Health

## 2018-08-10 ENCOUNTER — Encounter: Payer: Self-pay | Admitting: Adult Health

## 2018-08-10 VITALS — BP 126/82 | HR 76 | Temp 96.8°F | Ht 68.75 in | Wt 168.0 lb

## 2018-08-10 DIAGNOSIS — M25561 Pain in right knee: Secondary | ICD-10-CM | POA: Diagnosis not present

## 2018-08-10 MED ORDER — TRAMADOL HCL 50 MG PO TABS: ORAL_TABLET | ORAL | 0 refills | Status: DC

## 2018-08-10 NOTE — Progress Notes (Signed)
Assessment and Plan:  [name] [name] was seen today for knee injury.  Diagnoses and all orders for this visit:  Acute pain of right knee Acute onset, Patient preference for ortho referral to Dr. Delbert Harness office, nonspecific exam without laxity, effusion, crepitus, popping/clicking Will defer imaging to ortho office, patient has contact and will call to set up appointment Continue rest, OTC analgesic, will prescribe tramadol for severe pain PRN Follow up as recommended by ortho -     Ambulatory referral to Orthopedics -     traMADol (ULTRAM) 50 MG tablet; Take 1/2-1 tab up to every 6 hours as needed for pain.  Further disposition pending results of labs. Discussed med's effects and SE's.   Over 15 minutes of exam, counseling, chart review, and critical decision making was performed.   Future Appointments  Date Time Provider Department Center  08/10/2018  3:15 PM Judd Gaudier, NP [facility] None  10/12/2018  2:00 PM [name], MD [facility] None  12/09/2018  8:30 AM [name], MD [facility] None    ------------------------------------------------------------------------------------------------------------------   HPI BP 126/82   Pulse 76   Temp (!) 96.8 F (36 C)   Ht 5' 8.75" (1.746 m)   Wt 168 lb (76.2 kg)   SpO2 96%   BMI 24.99 kg/m   62 y.o.male presents for 3 weeks of right medial knee pain that began 3 weeks ago; he reports about that time he was assisting his daughter move and was picking up boxes and lugging furniture. He reports pain is now severe, stabbing, throbbing, aching, constant, 7/10, worse with weight bearing and gets to the point "where I'm just dragging."   He has tried heat and cold application, OTC topical agents, topical CBD, has been taking  Aleve 2 tab daily, reports no improvement, requesting referral to [facility] as he is familiar with their therapist.   Past Medical History:  Diagnosis Date  . Heart murmur   .  Hyperlipidemia   . Hypertension   . Shingles   . Vitamin D deficiency      No Known Allergies  Current Outpatient Medications on File Prior to Visit  Medication Sig  . aspirin EC 81 MG tablet Take 1 tablet daily  . Cholecalciferol 5000 units capsule Take 1 capsule (5,000 Units total) by mouth daily.  . simvastatin (ZOCOR) 40 MG tablet TAKE 1 TABLET BY MOUTH EVERY DAY  . valsartan (DIOVAN) 80 MG tablet TAKE 1 TABLET BY MOUTH EVERY DAY   No current facility-administered medications on file prior to visit.     ROS: all negative except above.   Physical Exam:  BP 126/82   Pulse 76   Temp (!) 96.8 F (36 C)   Ht 5' 8.75" (1.746 m)   Wt 168 lb (76.2 kg)   SpO2 96%   BMI 24.99 kg/m   General Appearance: Well nourished, in no apparent distress. Eyes: conjunctiva no swelling or erythema ENT/Mouth: Hearing normal.  Neck: Supple Respiratory: Respiratory effort normal Cardio: Brisk peripheral pulses without edema.  Abdomen: Soft, + BS.  Non tender Lymphatics: Non tender without lymphadenopathy.  Musculoskeletal: Full ROM, no clicking,popping, laxity to anterior/posterior drawer, lateral ligament laxity, effusion. He is very tender over medial joint space palpation and over patella. 5/5 strength, antalgic gait.  Skin: Warm, dry without rashes, lesions, ecchymosis.  Neuro: Cranial nerves intact. Normal muscle tone,Sensation intact.  Psych: Awake and oriented X 3, normal affect, Insight and Judgment appropriate.     Dan Maker, NP 3:01 PM  [facility]

## 2018-08-10 NOTE — Patient Instructions (Signed)
Tramadol tablets What is this medicine? TRAMADOL (TRA ma dole) is a pain reliever. It is used to treat moderate to severe pain in adults. This medicine may be used for other purposes; ask your health care provider or pharmacist if you have questions. COMMON BRAND NAME(S): Ultram What should I tell my health care provider before I take this medicine? They need to know if you have any of these conditions: -brain tumor -depression -drug abuse or addiction -head injury -if you frequently drink alcohol containing drinks -kidney disease or trouble passing urine -liver disease -lung disease, asthma, or breathing problems -seizures or epilepsy -suicidal thoughts, plans, or attempt; a previous suicide attempt by you or a family member -an unusual or allergic reaction to tramadol, codeine, other medicines, foods, dyes, or preservatives -pregnant or trying to get pregnant -breast-feeding How should I use this medicine? Take this medicine by mouth with a full glass of water. Follow the directions on the prescription label. You can take it with or without food. If it upsets your stomach, take it with food. Do not take your medicine more often than directed. A special MedGuide will be given to you by the pharmacist with each prescription and refill. Be sure to read this information carefully each time. Talk to your pediatrician regarding the use of this medicine in children. Special care may be needed. Overdosage: If you think you have taken too much of this medicine contact a poison control center or emergency room at once. NOTE: This medicine is only for you. Do not share this medicine with others. What if I miss a dose? If you miss a dose, take it as soon as you can. If it is almost time for your next dose, take only that dose. Do not take double or extra doses. What may interact with this medicine? Do not take this medication with any of the following medicines: -MAOIs like Carbex, Eldepryl,  Marplan, Nardil, and Parnate This medicine may also interact with the following medications: -alcohol -antihistamines for allergy, cough and cold -certain medicines for anxiety or sleep -certain medicines for depression like amitriptyline, fluoxetine, sertraline -certain medicines for migraine headache like almotriptan, eletriptan, frovatriptan, naratriptan, rizatriptan, sumatriptan, zolmitriptan -certain medicines for seizures like carbamazepine, oxcarbazepine, phenobarbital, primidone -certain medicines that treat or prevent blood clots like warfarin -digoxin -furazolidone -general anesthetics like halothane, isoflurane, methoxyflurane, propofol -linezolid -local anesthetics like lidocaine, pramoxine, tetracaine -medicines that relax muscles for surgery -other narcotic medicines for pain or cough -phenothiazines like chlorpromazine, mesoridazine, prochlorperazine, thioridazine -procarbazine This list may not describe all possible interactions. Give your health care provider a list of all the medicines, herbs, non-prescription drugs, or dietary supplements you use. Also tell them if you smoke, drink alcohol, or use illegal drugs. Some items may interact with your medicine. What should I watch for while using this medicine? Tell your doctor or health care professional if your pain does not go away, if it gets worse, or if you have new or a different type of pain. You may develop tolerance to the medicine. Tolerance means that you will need a higher dose of the medicine for pain relief. Tolerance is normal and is expected if you take this medicine for a long time. Do not suddenly stop taking your medicine because you may develop a severe reaction. Your body becomes used to the medicine. This does NOT mean you are addicted. Addiction is a behavior related to getting and using a drug for a non-medical reason. If you have pain, you   have a medical reason to take pain medicine. Your doctor will tell  you how much medicine to take. If your doctor wants you to stop the medicine, the dose will be slowly lowered over time to avoid any side effects. There are different types of narcotic medicines (opiates). If you take more than one type at the same time or if you are taking another medicine that also causes drowsiness, you may have more side effects. Give your health care provider a list of all medicines you use. Your doctor will tell you how much medicine to take. Do not take more medicine than directed. Call emergency for help if you have problems breathing or unusual sleepiness. You may get drowsy or dizzy. Do not drive, use machinery, or do anything that needs mental alertness until you know how this medicine affects you. Do not stand or sit up quickly, especially if you are an older patient. This reduces the risk of dizzy or fainting spells. Alcohol can increase or decrease the effects of this medicine. Avoid alcoholic drinks. You may have constipation. Try to have a bowel movement at least every 2 to 3 days. If you do not have a bowel movement for 3 days, call your doctor or health care professional. Your mouth may get dry. Chewing sugarless gum or sucking hard candy, and drinking plenty of water may help. Contact your doctor if the problem does not go away or is severe. What side effects may I notice from receiving this medicine? Side effects that you should report to your doctor or health care professional as soon as possible: -allergic reactions like skin rash, itching or hives, swelling of the face, lips, or tongue -breathing problems -confusion -seizures -signs and symptoms of low blood pressure like dizziness; feeling faint or lightheaded, falls; unusually weak or tired -trouble passing urine or change in the amount of urine Side effects that usually do not require medical attention (report to your doctor or health care professional if they continue or are bothersome): -constipation -dry  mouth -nausea, vomiting -tiredness This list may not describe all possible side effects. Call your doctor for medical advice about side effects. You may report side effects to FDA at 1-800-FDA-1088. Where should I keep my medicine? Keep out of the reach of children. This medicine may cause accidental overdose and death if it taken by other adults, children, or pets. Mix any unused medicine with a substance like cat litter or coffee grounds. Then throw the medicine away in a sealed container like a sealed bag or a coffee can with a lid. Do not use the medicine after the expiration date. Store at room temperature between 15 and 30 degrees C (59 and 86 degrees F). NOTE: This sheet is a summary. It may not cover all possible information. If you have questions about this medicine, talk to your doctor, pharmacist, or health care provider.  2018 Elsevier/Gold Standard (2015-06-25 09:00:04)  

## 2018-08-19 DIAGNOSIS — M25561 Pain in right knee: Secondary | ICD-10-CM | POA: Diagnosis not present

## 2018-08-23 DIAGNOSIS — G4733 Obstructive sleep apnea (adult) (pediatric): Secondary | ICD-10-CM | POA: Diagnosis not present

## 2018-08-24 DIAGNOSIS — M25561 Pain in right knee: Secondary | ICD-10-CM | POA: Diagnosis not present

## 2018-09-07 DIAGNOSIS — M25561 Pain in right knee: Secondary | ICD-10-CM | POA: Diagnosis not present

## 2018-09-22 DIAGNOSIS — G4733 Obstructive sleep apnea (adult) (pediatric): Secondary | ICD-10-CM | POA: Diagnosis not present

## 2018-09-28 ENCOUNTER — Ambulatory Visit (INDEPENDENT_AMBULATORY_CARE_PROVIDER_SITE_OTHER): Payer: BLUE CROSS/BLUE SHIELD | Admitting: Internal Medicine

## 2018-09-28 ENCOUNTER — Encounter: Payer: Self-pay | Admitting: Internal Medicine

## 2018-09-28 ENCOUNTER — Telehealth: Payer: Self-pay | Admitting: *Deleted

## 2018-09-28 VITALS — BP 122/74 | HR 76 | Temp 97.1°F | Resp 16 | Ht 68.75 in | Wt 164.6 lb

## 2018-09-28 DIAGNOSIS — E782 Mixed hyperlipidemia: Secondary | ICD-10-CM | POA: Diagnosis not present

## 2018-09-28 DIAGNOSIS — R7303 Prediabetes: Secondary | ICD-10-CM

## 2018-09-28 DIAGNOSIS — I10 Essential (primary) hypertension: Secondary | ICD-10-CM | POA: Diagnosis not present

## 2018-09-28 NOTE — Progress Notes (Signed)
This very nice 62 y.o. DWM presents for pre-op clearance. Patient is scheduled by Dr Renaye Rakers for Rt knee arthroscopy next week. Patient is regularly followed for  HTN, HLD, Pre-Diabetes and Vitamin D Deficiency. He has OSA/ CPAP followed by Ms Elly Modena, NP & Dr Frances Furbish.     Patient is treated for HTN since 2004  & BP has been controlled at home. Today's BP is at goal - 122/74.  Patient has soft murmur attributed to mild Tricuspid Regurgitation by 2D echo in 2015 - felt trivial &  benign by Dr Anne Fu.  Patient has had no complaints of any cardiac type chest pain, palpitations, dyspnea / orthopnea / PND, dizziness, claudication, or dependent edema. Further, he denies cough or congestion.      Hyperlipidemia is controlled with diet & meds. Patient denies myalgias or other med SE's. Last Lipids were at goal: Lab Results  Component Value Date   CHOL 154 03/17/2018   HDL 47 03/17/2018   LDLCALC 86 03/17/2018   TRIG 115 03/17/2018   CHOLHDL 3.3 03/17/2018      Also, the patient has history of  PreDiabetes  (A1c 6.0% / 2015) and has had no symptoms of reactive hypoglycemia, diabetic polys, paresthesias or visual blurring.  Last A1c was not at goal: Lab Results  Component Value Date   HGBA1C 5.9 (H) 03/17/2018      Further, the patient also has history of Vitamin D Deficiency ("35" / 2009)  and supplements vitamin D without any suspected side-effects. Last vitamin D was improved (goal 70-100):  Lab Results  Component Value Date   VD25OH 49 03/17/2018   Current Outpatient Medications on File Prior to Visit  Medication Sig  . aspirin EC 81 MG tablet Take 1 tablet daily  . Cholecalciferol 5000 units capsule Take 1 capsule (5,000 Units total) by mouth daily.  . simvastatin (ZOCOR) 40 MG tablet TAKE 1 TABLET BY MOUTH EVERY DAY  . valsartan (DIOVAN) 80 MG tablet TAKE 1 TABLET BY MOUTH EVERY DAY   No current facility-administered medications on file prior to visit.    No Known Allergies    PMHx:   Past Medical History:  Diagnosis Date  . Heart murmur   . Hyperlipidemia   . Hypertension   . Shingles   . Vitamin D deficiency    Immunization History  Administered Date(s) Administered  . Influenza Inj Mdck Quad With Preservative 09/02/2017  . PPD Test 09/02/2017  . Tdap 10/19/2008   Past Surgical History:  Procedure Laterality Date  . INGUINAL HERNIA REPAIR Bilateral 09/28/2015   Procedure: LAPAROSCOPIC BILATERAL INGUINAL HERNIA REPAIR;  Surgeon: Gaynelle Adu, MD;  Location: [facility];  Service: General;  Laterality: Bilateral;  . INSERTION OF MESH Bilateral 09/28/2015   Procedure: INSERTION OF MESH;  Surgeon: Gaynelle Adu, MD;  Location: [facility];  Service: General;  Laterality: Bilateral;  . SHOULDER ARTHROSCOPY     FHx:    Reviewed / unchanged  SHx:    Reviewed / unchanged   Systems Review:  Constitutional: Denies fever, chills, wt changes, headaches, insomnia, fatigue, night sweats, change in appetite. Eyes: Denies redness, blurred vision, diplopia, discharge, itchy, watery eyes.  ENT: Denies discharge, congestion, post nasal drip, epistaxis, sore throat, earache, hearing loss, dental pain, tinnitus, vertigo, sinus pain, snoring.  CV: Denies chest pain, palpitations, irregular heartbeat, syncope, dyspnea, diaphoresis, orthopnea, PND, claudication or edema. Respiratory: denies cough, dyspnea, DOE, pleurisy, hoarseness, laryngitis, wheezing.  Gastrointestinal: Denies dysphagia, odynophagia, heartburn,  reflux, water brash, abdominal pain or cramps, nausea, vomiting, bloating, diarrhea, constipation, hematemesis, melena, hematochezia  or hemorrhoids. Genitourinary: Denies dysuria, frequency, urgency, nocturia, hesitancy, discharge, hematuria or flank pain. Musculoskeletal: Denies myalgias. Has pain Rt knee with weightbearing.  Skin: Denies pruritus, rash, hives, warts, acne, eczema or change in skin lesion(s). Neuro: No weakness, tremor, incoordination, spasms,  paresthesia or pain. Psychiatric: Denies confusion, memory loss or sensory loss. Endo: Denies change in weight, skin or hair change.  Heme/Lymph: No excessive bleeding, bruising or enlarged lymph nodes.  Physical Exam  BP 122/74   Pulse 76   Temp (!) 97.1 F (36.2 C)   Resp 16   Ht 5' 8.75" (1.746 m)   Wt 164 lb 9.6 oz (74.7 kg)   BMI 24.48 kg/m   Appears  well nourished, well groomed  and in no distress.  Eyes: PERRLA, EOMs, conjunctiva no swelling or erythema. Sinuses: No frontal/maxillary tenderness ENT/Mouth: EAC's clear, TM's nl w/o erythema, bulging. Nares clear w/o erythema, swelling, exudates. Oropharynx clear without erythema or exudates. Oral hygiene is good. Tongue normal, non obstructing. Hearing intact.  Neck: Supple. Thyroid not palpable. Car 2+/2+ without bruits, nodes or JVD. Chest: Respirations nl with BS clear & equal w/o rales, rhonchi, wheezing or stridor.  Cor: Heart sounds normal w/ regular rate and rhythm with a soft Gr 1-2 Sys murmur at RtUSB.  Peripheral pulses normal and equal  without edema.  Abdomen: Soft & benign  Lymphatics: Unremarkable.  Musculoskeletal:  Sl limp favoring the RLE Skin: Warm, dry without exposed rashes, lesions or ecchymosis apparent.  Neuro: Cranial nerves intact, reflexes equal bilaterally. Sensory-motor testing grossly intact. Tendon reflexes grossly intact.  Pysch: Alert & oriented x 3.  Insight and judgement nl & appropriate. No ideations.  Assessment and Plan:  1. Essential hypertension, benign  - Continue medication, monitor blood pressure at home.  - Continue DASH diet.  Reminder to go to the ER if any CP,  SOB, nausea, dizziness, severe HA, changes vision/speech.   2. Hyperlipidemia, mixed  - Continue diet/meds, exercise,& lifestyle modifications.  - Continue monitor periodic cholesterol/liver & renal functions    3. Prediabetes  - Continue diet, exercise, lifestyle modifications.  - Monitor appropriate  labs.       OK for surgery w/o restrictions.       Discussed  regular exercise, BP monitoring, weight control to achieve/maintain BMI less than 25 and discussed med and SE's. Recommended labs to assess and monitor clinical status with further disposition pending results of labs. Over 30 minutes of exam, counseling, chart review was performed.

## 2018-09-28 NOTE — Telephone Encounter (Signed)
Duplicate entry

## 2018-09-28 NOTE — Patient Instructions (Signed)

## 2018-09-28 NOTE — Telephone Encounter (Signed)
Patient's surgical clearance form and records faxed to [phone]-ATTN-[place].

## 2018-10-02 ENCOUNTER — Telehealth: Payer: Self-pay | Admitting: *Deleted

## 2018-10-02 NOTE — Telephone Encounter (Signed)
Received fax yesterday from Charlies Constableori Jarrell, RN BSN with [facility]. I left message yesterday as the fax stated 2 pages though only 1 page came through. Tori, RN called back today and stated they are not asking for surgery clearance, the surgeon is requesting the last ov note from Cardiology and last Echo from our office. I did clarify not asking for any cardiac clearance though. I faxed over the ov note from Dr. Anne FuSkains 02/18/14 and echo report from 05/11/14.

## 2018-10-08 DIAGNOSIS — G8918 Other acute postprocedural pain: Secondary | ICD-10-CM | POA: Diagnosis not present

## 2018-10-08 DIAGNOSIS — S83241A Other tear of medial meniscus, current injury, right knee, initial encounter: Secondary | ICD-10-CM | POA: Diagnosis not present

## 2018-10-08 DIAGNOSIS — X58XXXA Exposure to other specified factors, initial encounter: Secondary | ICD-10-CM | POA: Diagnosis not present

## 2018-10-08 DIAGNOSIS — S83231A Complex tear of medial meniscus, current injury, right knee, initial encounter: Secondary | ICD-10-CM | POA: Diagnosis not present

## 2018-10-08 DIAGNOSIS — Y999 Unspecified external cause status: Secondary | ICD-10-CM | POA: Diagnosis not present

## 2018-10-08 DIAGNOSIS — M94261 Chondromalacia, right knee: Secondary | ICD-10-CM | POA: Diagnosis not present

## 2018-10-08 DIAGNOSIS — S83281A Other tear of lateral meniscus, current injury, right knee, initial encounter: Secondary | ICD-10-CM | POA: Diagnosis not present

## 2018-10-08 DIAGNOSIS — S83271A Complex tear of lateral meniscus, current injury, right knee, initial encounter: Secondary | ICD-10-CM | POA: Diagnosis not present

## 2018-10-11 ENCOUNTER — Encounter: Payer: Self-pay | Admitting: Internal Medicine

## 2018-10-11 NOTE — Progress Notes (Signed)
    R  E  S  C  H  E  D  U  L  E D  T  H  E      M  O  R  N  I  N  G      O  F  A  P  P  O  I  N  T  M  E  N  T

## 2018-10-12 ENCOUNTER — Ambulatory Visit: Payer: Self-pay | Admitting: Internal Medicine

## 2018-10-12 DIAGNOSIS — Z111 Encounter for screening for respiratory tuberculosis: Secondary | ICD-10-CM

## 2018-10-12 DIAGNOSIS — R5383 Other fatigue: Secondary | ICD-10-CM | POA: Insufficient documentation

## 2018-10-21 DIAGNOSIS — S83241D Other tear of medial meniscus, current injury, right knee, subsequent encounter: Secondary | ICD-10-CM | POA: Diagnosis not present

## 2018-10-21 DIAGNOSIS — S83281D Other tear of lateral meniscus, current injury, right knee, subsequent encounter: Secondary | ICD-10-CM | POA: Diagnosis not present

## 2018-10-23 DIAGNOSIS — M6281 Muscle weakness (generalized): Secondary | ICD-10-CM | POA: Diagnosis not present

## 2018-10-23 DIAGNOSIS — M23303 Other meniscus derangements, unspecified medial meniscus, right knee: Secondary | ICD-10-CM | POA: Diagnosis not present

## 2018-10-23 DIAGNOSIS — G4733 Obstructive sleep apnea (adult) (pediatric): Secondary | ICD-10-CM | POA: Diagnosis not present

## 2018-10-23 DIAGNOSIS — M25661 Stiffness of right knee, not elsewhere classified: Secondary | ICD-10-CM | POA: Diagnosis not present

## 2018-10-23 DIAGNOSIS — M23300 Other meniscus derangements, unspecified lateral meniscus, right knee: Secondary | ICD-10-CM | POA: Diagnosis not present

## 2018-10-30 DIAGNOSIS — M25661 Stiffness of right knee, not elsewhere classified: Secondary | ICD-10-CM | POA: Diagnosis not present

## 2018-10-30 DIAGNOSIS — R262 Difficulty in walking, not elsewhere classified: Secondary | ICD-10-CM | POA: Diagnosis not present

## 2018-10-30 DIAGNOSIS — M6281 Muscle weakness (generalized): Secondary | ICD-10-CM | POA: Diagnosis not present

## 2018-10-30 DIAGNOSIS — M25561 Pain in right knee: Secondary | ICD-10-CM | POA: Diagnosis not present

## 2018-11-04 ENCOUNTER — Other Ambulatory Visit: Payer: Self-pay | Admitting: Adult Health

## 2018-11-04 DIAGNOSIS — M25661 Stiffness of right knee, not elsewhere classified: Secondary | ICD-10-CM | POA: Diagnosis not present

## 2018-11-04 DIAGNOSIS — M6281 Muscle weakness (generalized): Secondary | ICD-10-CM | POA: Diagnosis not present

## 2018-11-04 DIAGNOSIS — R269 Unspecified abnormalities of gait and mobility: Secondary | ICD-10-CM | POA: Diagnosis not present

## 2018-11-04 DIAGNOSIS — M23303 Other meniscus derangements, unspecified medial meniscus, right knee: Secondary | ICD-10-CM | POA: Diagnosis not present

## 2018-11-09 DIAGNOSIS — M6281 Muscle weakness (generalized): Secondary | ICD-10-CM | POA: Diagnosis not present

## 2018-11-09 DIAGNOSIS — M23303 Other meniscus derangements, unspecified medial meniscus, right knee: Secondary | ICD-10-CM | POA: Diagnosis not present

## 2018-11-09 DIAGNOSIS — R269 Unspecified abnormalities of gait and mobility: Secondary | ICD-10-CM | POA: Diagnosis not present

## 2018-11-09 DIAGNOSIS — M25661 Stiffness of right knee, not elsewhere classified: Secondary | ICD-10-CM | POA: Diagnosis not present

## 2018-11-20 DIAGNOSIS — M23303 Other meniscus derangements, unspecified medial meniscus, right knee: Secondary | ICD-10-CM | POA: Diagnosis not present

## 2018-11-23 DIAGNOSIS — G4733 Obstructive sleep apnea (adult) (pediatric): Secondary | ICD-10-CM | POA: Diagnosis not present

## 2018-11-25 ENCOUNTER — Other Ambulatory Visit: Payer: Self-pay | Admitting: Adult Health

## 2018-12-09 ENCOUNTER — Ambulatory Visit: Payer: BLUE CROSS/BLUE SHIELD | Admitting: Neurology

## 2018-12-20 ENCOUNTER — Other Ambulatory Visit: Payer: Self-pay | Admitting: Adult Health

## 2018-12-21 DIAGNOSIS — M23303 Other meniscus derangements, unspecified medial meniscus, right knee: Secondary | ICD-10-CM | POA: Diagnosis not present

## 2019-01-18 ENCOUNTER — Encounter: Payer: Self-pay | Admitting: Internal Medicine

## 2019-01-18 ENCOUNTER — Ambulatory Visit (INDEPENDENT_AMBULATORY_CARE_PROVIDER_SITE_OTHER): Payer: BLUE CROSS/BLUE SHIELD | Admitting: Internal Medicine

## 2019-01-18 ENCOUNTER — Other Ambulatory Visit: Payer: Self-pay

## 2019-01-18 VITALS — BP 138/78 | HR 64 | Temp 97.4°F | Resp 16 | Ht 69.0 in | Wt 171.4 lb

## 2019-01-18 DIAGNOSIS — Z131 Encounter for screening for diabetes mellitus: Secondary | ICD-10-CM | POA: Diagnosis not present

## 2019-01-18 DIAGNOSIS — Z130 Encounter for screening for diseases of the blood and blood-forming organs and certain disorders involving the immune mechanism: Secondary | ICD-10-CM | POA: Diagnosis not present

## 2019-01-18 DIAGNOSIS — Z125 Encounter for screening for malignant neoplasm of prostate: Secondary | ICD-10-CM | POA: Diagnosis not present

## 2019-01-18 DIAGNOSIS — I10 Essential (primary) hypertension: Secondary | ICD-10-CM

## 2019-01-18 DIAGNOSIS — Z23 Encounter for immunization: Secondary | ICD-10-CM

## 2019-01-18 DIAGNOSIS — Z13220 Encounter for screening for lipoid disorders: Secondary | ICD-10-CM | POA: Diagnosis not present

## 2019-01-18 DIAGNOSIS — Z136 Encounter for screening for cardiovascular disorders: Secondary | ICD-10-CM

## 2019-01-18 DIAGNOSIS — Z0001 Encounter for general adult medical examination with abnormal findings: Secondary | ICD-10-CM

## 2019-01-18 DIAGNOSIS — N138 Other obstructive and reflux uropathy: Secondary | ICD-10-CM

## 2019-01-18 DIAGNOSIS — I071 Rheumatic tricuspid insufficiency: Secondary | ICD-10-CM

## 2019-01-18 DIAGNOSIS — Z1389 Encounter for screening for other disorder: Secondary | ICD-10-CM | POA: Diagnosis not present

## 2019-01-18 DIAGNOSIS — N401 Enlarged prostate with lower urinary tract symptoms: Secondary | ICD-10-CM

## 2019-01-18 DIAGNOSIS — R350 Frequency of micturition: Secondary | ICD-10-CM | POA: Diagnosis not present

## 2019-01-18 DIAGNOSIS — R7309 Other abnormal glucose: Secondary | ICD-10-CM

## 2019-01-18 DIAGNOSIS — Z8249 Family history of ischemic heart disease and other diseases of the circulatory system: Secondary | ICD-10-CM

## 2019-01-18 DIAGNOSIS — Z1329 Encounter for screening for other suspected endocrine disorder: Secondary | ICD-10-CM | POA: Diagnosis not present

## 2019-01-18 DIAGNOSIS — G4733 Obstructive sleep apnea (adult) (pediatric): Secondary | ICD-10-CM

## 2019-01-18 DIAGNOSIS — E559 Vitamin D deficiency, unspecified: Secondary | ICD-10-CM

## 2019-01-18 DIAGNOSIS — Z1211 Encounter for screening for malignant neoplasm of colon: Secondary | ICD-10-CM

## 2019-01-18 DIAGNOSIS — Z79899 Other long term (current) drug therapy: Secondary | ICD-10-CM

## 2019-01-18 DIAGNOSIS — Z1212 Encounter for screening for malignant neoplasm of rectum: Secondary | ICD-10-CM

## 2019-01-18 DIAGNOSIS — E782 Mixed hyperlipidemia: Secondary | ICD-10-CM

## 2019-01-18 DIAGNOSIS — Z0000 Encounter for general adult medical examination without abnormal findings: Secondary | ICD-10-CM

## 2019-01-18 DIAGNOSIS — R7303 Prediabetes: Secondary | ICD-10-CM

## 2019-01-18 NOTE — Progress Notes (Signed)
[facility]   [name] [name], M.D.     Dyanne Carrel. Steffanie Dunn, P.A.-C [name] Gaudier, DNP [facility]                [address]                [place], [place]. [address] Telephone [phone] Telefax [phone] Annual  Screening/Preventative Visit  & Comprehensive Evaluation & Examination   History of Present Illness:     This very nice 63 y.o. DWM presents for a Screening /Preventative Visit & comprehensive evaluation and management of multiple medical co-morbidities.  Patient has been followed for HTN, HLD, Prediabetes and Vitamin D Deficiency.     HTN predates circa 2004. Patient's BP has been controlled at home.  Today's BP was initially sl elevated & rechecked at goal- 138/78. Patient denies any cardiac symptoms as chest pain, palpitations, shortness of breath, dizziness or ankle swelling.     Patient's hyperlipidemia is controlled with diet and medications. Patient denies myalgias or other medication SE's. Last lipids were  Lab Results  Component Value Date   CHOL 154 03/17/2018   HDL 47 03/17/2018   LDLCALC 86 03/17/2018   TRIG 115 03/17/2018   CHOLHDL 3.3 03/17/2018      Patient has hx/o prediabetes (A1c 6.0% / 2015) and patient denies reactive hypoglycemic symptoms, visual blurring, diabetic polys or paresthesias. Last A1c was not at goal: Lab Results  Component Value Date   HGBA1C 5.9 (H) 03/17/2018       Finally, patient has history of Vitamin D Deficiency ("35" / 2009)  and last vitamin D was still low (goal 70-100): Lab Results  Component Value Date   VD25OH 49 03/17/2018   Current Outpatient Medications on File Prior to Visit  Medication Sig  . aspirin EC 81 MG tablet Take 1 tablet daily  . Cholecalciferol 5000 units capsule Take 1 capsule (5,000 Units total) by mouth daily.  . simvastatin (ZOCOR) 40 MG tablet Take 1 tablet at Bedtime for Cholesterol  . valsartan (DIOVAN) 80 MG tablet TAKE 1  TABLET BY MOUTH EVERY DAY   No current facility-administered medications on file prior to visit.    No Known Allergies   Past Medical History:  Diagnosis Date  . Heart murmur   . Hyperlipidemia   . Hypertension   . Shingles   . Vitamin D deficiency    Health Maintenance  Topic Date Due  . Hepatitis C Screening  01-Aug-1956  . HIV Screening  11/21/1968  . TETANUS/TDAP  10/19/2018  . PNA vac Low Risk Adult (1 of 2 - PCV13) 11/21/2018  . INFLUENZA VACCINE  05/15/2019  . COLONOSCOPY  12/13/2022   Immunization History  Administered Date(s) Administered  . Influenza Inj Mdck Quad With Preservative 09/02/2017  . PPD Test 09/02/2017  . Pneumococcal Conjugate-13 01/18/2019  . Tdap 10/19/2008   Last Colon - due 12/13/2022 - Dr Kinnie Scales  Past Surgical History:  Procedure Laterality Date  . INGUINAL HERNIA REPAIR Bilateral 09/28/2015   Procedure: LAPAROSCOPIC BILATERAL INGUINAL HERNIA REPAIR;  Surgeon: Gaynelle Adu, MD;  Location: [facility];  Service: General;  Laterality: Bilateral;  . INSERTION OF MESH Bilateral 09/28/2015   Procedure: INSERTION OF MESH;  Surgeon: Gaynelle Adu, MD;  Location: [facility];  Service: General;  Laterality: Bilateral;  . SHOULDER ARTHROSCOPY     Family History  Problem Relation Age of Onset  . Cancer Mother  colon, lung  . Heart disease Father   . Hyperlipidemia Father   . Heart attack Father    Social History   Socioeconomic History  . Marital status: Married    Spouse name: Not on file  . Number of children: Not on file  . Years of education: Not on file  . Highest education level: Not on file  Occupational History  . 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  Tobacco Use  . Smoking status: Never Smoker  . Smokeless tobacco: Never Used  Substance and Sexual Activity  . Alcohol use: Yes    Comment: OCCASIONAL BEER   . Drug use: No  . Sexual activity: Not on file    ROS Constitutional: Denies fever, chills, weight loss/gain,  headaches, insomnia,  night sweats or change in appetite. Does c/o fatigue. Eyes: Denies redness, blurred vision, diplopia, discharge, itchy or watery eyes.  ENT: Denies discharge, congestion, post nasal drip, epistaxis, sore throat, earache, hearing loss, dental pain, Tinnitus, Vertigo, Sinus pain or snoring.  Cardio: Denies chest pain, palpitations, irregular heartbeat, syncope, dyspnea, diaphoresis, orthopnea, PND, claudication or edema Respiratory: denies cough, dyspnea, DOE, pleurisy, hoarseness, laryngitis or wheezing.  Gastrointestinal: Denies dysphagia, heartburn, reflux, water brash, pain, cramps, nausea, vomiting, bloating, diarrhea, constipation, hematemesis, melena, hematochezia, jaundice or hemorrhoids Genitourinary: Denies dysuria, frequency, discharge, hematuria or flank pain. Has urgency, nocturia x 2-3 & occasional hesitancy. Musculoskeletal: Denies arthralgia, myalgia, stiffness, Jt. Swelling, pain, limp or strain/sprain. Denies Falls. Skin: Denies puritis, rash, hives, warts, acne, eczema or change in skin lesion Neuro: No weakness, tremor, incoordination, spasms, paresthesia or pain Psychiatric: Denies confusion, memory loss or sensory loss. Denies Depression. Endocrine: Denies change in weight, skin, hair change, nocturia, and paresthesia, diabetic polys, visual blurring or hyper / hypo glycemic episodes.  Heme/Lymph: No excessive bleeding, bruising or enlarged lymph nodes.  Physical Exam  BP 138/78   Pulse 64   Temp (!) 97.4 F (36.3 C)   Resp 16   Ht 5\' 9"  (1.753 m)   Wt 171 lb 6.4 oz (77.7 kg)   BMI 25.31 kg/m   General Appearance: Well nourished and well groomed and in no apparent distress.  Eyes: PERRLA, EOMs, conjunctiva no swelling or erythema, normal fundi and vessels. Sinuses: No frontal/maxillary tenderness ENT/Mouth: EACs patent / TMs  nl. Nares clear without erythema, swelling, mucoid exudates. Oral hygiene is good. No erythema, swelling, or exudate.  Tongue normal, non-obstructing. Tonsils not swollen or erythematous. Hearing normal.  Neck: Supple, thyroid not palpable. No bruits, nodes or JVD. Respiratory: Respiratory effort normal.  BS equal and clear bilateral without rales, rhonci, wheezing or stridor. Cardio: Heart sounds are normal with regular rate and rhythm and no murmurs, rubs or gallops. Peripheral pulses are normal and equal bilaterally without edema. No aortic or femoral bruits. Chest: symmetric with normal excursions and percussion.  Abdomen: Soft, with Nl bowel sounds. Nontender, no guarding, rebound, hernias, masses, or organomegaly.  Lymphatics: Non tender without lymphadenopathy.  Musculoskeletal: Full ROM all peripheral extremities, joint stability, 5/5 strength, and normal gait. Skin: Warm and dry without rashes, lesions, cyanosis, clubbing or  ecchymosis.  Neuro: Cranial nerves intact, reflexes equal bilaterally. Normal muscle tone, no cerebellar symptoms. Sensation intact.  Pysch: Alert and oriented X 3 with normal affect, insight and judgment appropriate.   Assessment and Plan  1. Annual Preventative/Screening Exam   2. Essential hypertension, benign  - EKG 12-Lead - [place], RETROPERITNL ABD,  LTD - Urinalysis, Routine w reflex microscopic - CBC with  Differential/Platelet - COMPLETE METABOLIC PANEL WITH GFR - Magnesium - TSH  3. Hyperlipidemia, mixed  - EKG 12-Lead - [place], RETROPERITNL ABD,  LTD - Lipid panel - TSH  4. Abnormal glucose  - EKG 12-Lead - [place], RETROPERITNL ABD,  LTD - Hemoglobin A1c - Insulin, random  5. Vitamin D deficiency  - VITAMIN D 25 Hydroxyl  6. Prediabetes  - EKG 12-Lead - [place], RETROPERITNL ABD,  LTD - Hemoglobin A1c - Insulin, random  7. OSA (obstructive sleep apnea)   8. Screening for ischemic heart disease  - EKG 12-Lead  9. FHx: heart disease  - EKG 12-Lead - [place], RETROPERITNL ABD,  LTD  10. Tricuspid valve insufficiency, unspecified etiology  - EKG  12-Lead  11. Screening for AAA (aortic abdominal aneurysm)  - [place], RETROPERITNL ABD,  LTD  12. Screening for colorectal cancer  - POC Hemoccult Bld/Stl l  13. BPH with obstruction/lower urinary tract symptoms  - PSA  14. Screening for prostate cancer  - PSA  15. Medication management  - Urinalysis, Routine w reflex microscopic - Microalbumin / creatinine urine ratio - CBC with Differential/Platelet - COMPLETE METABOLIC PANEL WITH GFR - Magnesium - Lipid panel - TSH - Hemoglobin A1c - Insulin, random - VITAMIN D 25 Hydroxyl  16. FH: heart disease  17. Need for prophylactic vaccination against llpneumococcus  - Pneumococcal conjugate vaccine 13-valent              Patient was counseled in prudent diet, weight control to achieve/maintain BMI less than 25, BP monitoring, regular exercise and medications as discussed.  Discussed med effects and SE's. Routine screening labs and tests as requested with regular follow-up as recommended. Over 40 minutes of exam, counseling, chart review and high complex critical decision making was performed   I discussed the assessment and treatment plan as above with the patient. The patient was provided an opportunity to ask questions and all were answered. The patient agreed with the plan and demonstrated an understanding of the instructions.        The patient was advised to call back or seek an in-person evaluation if the symptoms worsen or if the condition fails to improve as anticipated.       I provi minutes of non-face-to-face time during this encounter and over 40 minutes of exam, counseling, chart review and  complex critical decision making was performed  [name] Maw, MD

## 2019-01-18 NOTE — Patient Instructions (Signed)
Coronavirus (COVID-19) Are you at risk?  Are you at risk for the Coronavirus (COVID-19)?  To be considered HIGH RISK for Coronavirus (COVID-19), you have to meet the following criteria:  . Traveled to [place], [place], [place], [place] or [place]; or in the [place] to [place], [place], [place]  . or [place]; and have fever, cough, and shortness of breath within the last 2 weeks of travel OR . Been in close contact with a person diagnosed with COVID-19 within the last 2 weeks and have  . fever, cough,and shortness of breath .  . IF YOU DO NOT MEET THESE CRITERIA, YOU ARE CONSIDERED LOW RISK FOR COVID-19.  What to do if you are HIGH RISK for COVID-19?  . If you are having a medical emergency, call 911. . Seek medical care right away. Before you go to a doctor's office, urgent care or emergency department, .  call ahead and tell them about your recent travel, contact with someone diagnosed with COVID-19  .  and your symptoms.  . You should receive instructions from your physician's office regarding next steps of care.  . When you arrive at healthcare provider, tell the healthcare staff immediately you have returned from  . visiting [place], [place], [place], [place] or [place]; or traveled in the [place] to [place], [place],  . [place] or [place] in the last two weeks or you have been in close contact with a person diagnosed with  . COVID-19 in the last 2 weeks.   . Tell the health care staff about your symptoms: fever, cough and shortness of breath. . After you have been seen by a medical provider, you will be either: o Tested for (COVID-19) and discharged home on quarantine except to seek medical care if  o symptoms worsen, and asked to  - Stay home and avoid contact with others until you get your results (4-5 days)  - Avoid travel on public transportation if possible (such as bus, train, or airplane) or o Sent to the Emergency Department by EMS for evaluation,  COVID-19 testing  and  o possible admission depending on your condition and test results.  What to do if you are LOW RISK for COVID-19?  Reduce your risk of any infection by using the same precautions used for avoiding the common cold or flu:  . Wash your hands often with soap and warm water for at least 20 seconds.  If soap and water are not readily available,  . use an alcohol-based hand sanitizer with at least 60% alcohol.  . If coughing or sneezing, cover your mouth and nose by coughing or sneezing into the elbow areas of your shirt or coat, .  into a tissue or into your sleeve (not your hands). . Avoid shaking hands with others and consider head nods or verbal greetings only. . Avoid touching your eyes, nose, or mouth with unwashed hands.  . Avoid close contact with people who are sick. . Avoid places or events with large numbers of people in one location, like concerts or sporting events. . Carefully consider travel plans you have or are making. . If you are planning any travel outside or inside the [place], visit the CDC's Travelers' Health webpage for the latest health notices. . If you have some symptoms but not all symptoms, continue to monitor at home and seek medical attention  . if your symptoms worsen. . If you are having a medical emergency, call 911. >>>>>>>>>>>>>>>>>>>>>>>>>>>>>>>>>>>>>>>>>>>>>>>>>>>>>>>   We Do NOT Approve of  [facility] [place] Soliciting Our Patients  To Do Home Visits  & We Do NOT Approve of [employer] > > > > > > > > > > > > > > > > > > > > > > > > > > > > > > > > > > >  > > > >   Preventive Care for Adults  A healthy lifestyle and preventive care can promote health and wellness. Preventive health guidelines for men include the following key practices:  A routine yearly physical is a good way to check with your health care provider about your health and preventative screening. It is a chance to share any concerns and updates on  your health and to receive a thorough exam.  Visit your dentist for a routine exam and preventative care every 6 months. Brush your teeth twice a day and floss once a day. Good oral hygiene prevents tooth decay and gum disease.  The frequency of eye exams is based on your age, health, family medical history, use of contact lenses, and other factors. Follow your health care provider's recommendations for frequency of eye exams.  Eat a healthy diet. Foods such as vegetables, fruits, whole grains, low-fat dairy products, and lean protein foods contain the nutrients you need without too many calories. Decrease your intake of foods high in solid fats, added sugars, and salt. Eat the right amount of calories for you. Get information about a proper diet from your health care provider, if necessary.  Regular physical exercise is one of the most important things you can do for your health. Most adults should get at least 150 minutes of moderate-intensity exercise (any activity that increases your heart rate and causes you to sweat) each week. In addition, most adults need muscle-strengthening exercises on 2 or more days a week.  Maintain a healthy weight. The body mass index (BMI) is a screening tool to identify possible weight problems. It provides an estimate of body fat based on height and weight. Your health care provider can find your BMI and can help you achieve or maintain a healthy weight. For adults 20 years and older:  A BMI below 18.5 is considered underweight.  A BMI of 18.5 to 24.9 is normal.  A BMI of 25 to 29.9 is considered overweight.  A BMI of 30 and above is considered obese.  Maintain normal blood lipids and cholesterol levels by exercising and minimizing your intake of saturated fat. Eat a balanced diet with plenty of fruit and vegetables. Blood tests for lipids and cholesterol should begin at age 20 and be repeated every 5 years. If your lipid or cholesterol levels are high, you are  over 50, or you are at high risk for heart disease, you may need your cholesterol levels checked more frequently. Ongoing high lipid and cholesterol levels should be treated with medicines if diet and exercise are not working.  If you smoke, find out from your health care provider how to quit. If you do not use tobacco, do not start.  Lung cancer screening is recommended for adults aged 55-80 years who are at high risk for developing lung cancer because of a history of smoking. A yearly low-dose CT scan of the lungs is recommended for people who have at least a 30-pack-year history of smoking and are a current smoker or have quit within the past 15 years. A pack year of smoking is smoking an average of 1   pack of cigarettes a day for 1 year (for example: 1 pack a day for 30 years or 2 packs a day for 15 years). Yearly screening should continue until the smoker has stopped smoking for at least 15 years. Yearly screening should be stopped for people who develop a health problem that would prevent them from having lung cancer treatment.  If you choose to drink alcohol, do not have more than 2 drinks per day. One drink is considered to be 12 ounces (355 mL) of beer, 5 ounces (148 mL) of wine, or 1.5 ounces (44 mL) of liquor.  Avoid use of street drugs. Do not share needles with anyone. Ask for help if you need support or instructions about stopping the use of drugs.  High blood pressure causes heart disease and increases the risk of stroke. Your blood pressure should be checked at least every 1-2 years. Ongoing high blood pressure should be treated with medicines, if weight loss and exercise are not effective.  If you are 45-79 years old, ask your health care provider if you should take aspirin to prevent heart disease.  Diabetes screening involves taking a blood sample to check your fasting blood sugar level. Testing should be considered at a younger age or be carried out more frequently if you are  overweight and have at least 1 risk factor for diabetes.  Colorectal cancer can be detected and often prevented. Most routine colorectal cancer screening begins at the age of 50 and continues through age 75. However, your health care provider may recommend screening at an earlier age if you have risk factors for colon cancer. On a yearly basis, your health care provider may provide home test kits to check for hidden blood in the stool. Use of a small camera at the end of a tube to directly examine the colon (sigmoidoscopy or colonoscopy) can detect the earliest forms of colorectal cancer. Talk to your health care provider about this at age 50, when routine screening begins. Direct exam of the colon should be repeated every 5-10 years through age 75, unless early forms of precancerous polyps or small growths are found.  Hepatitis C blood testing is recommended for all people born from 1945 through 1965 and any individual with known risks for hepatitis C.  Screening for abdominal aortic aneurysm (AAA)  by ultrasound is recommended for people who have history of high blood pressure or who are current or former smokers.  Healthy men should  receive prostate-specific antigen (PSA) blood tests as part of routine cancer screening. Talk with your health care provider about prostate cancer screening.  Testicular cancer screening is  recommended for adult males. Screening includes self-exam, a health care provider exam, and other screening tests. Consult with your health care provider about any symptoms you have or any concerns you have about testicular cancer.  Use sunscreen. Apply sunscreen liberally and repeatedly throughout the day. You should seek shade when your shadow is shorter than you. Protect yourself by wearing long sleeves, pants, a wide-brimmed hat, and sunglasses year round, whenever you are outdoors.  Once a month, do a whole-body skin exam, using a mirror to look at the skin on your back. Tell  your health care provider about new moles, moles that have irregular borders, moles that are larger than a pencil eraser, or moles that have changed in shape or color.  Stay current with required vaccines (immunizations).  Influenza vaccine. All adults should be immunized every year.  Tetanus, diphtheria, and acellular   pertussis (Td, Tdap) vaccine. An adult who has not previously received Tdap or who does not know his vaccine status should receive 1 dose of Tdap. This initial dose should be followed by tetanus and diphtheria toxoids (Td) booster doses every 10 years. Adults with an unknown or incomplete history of completing a 3-dose immunization series with Td-containing vaccines should begin or complete a primary immunization series including a Tdap dose. Adults should receive a Td booster every 10 years.  Zoster vaccine. One dose is recommended for adults aged 60 years or older unless certain conditions are present.    PREVNAR - Pneumococcal 13-valent conjugate (PCV13) vaccine. When indicated, a person who is uncertain of his immunization history and has no record of immunization should receive the PCV13 vaccine. An adult aged 19 years or older who has certain medical conditions and has not been previously immunized should receive 1 dose of PCV13 vaccine. This PCV13 should be followed with a dose of pneumococcal polysaccharide (PPSV23) vaccine. The PPSV23 vaccine dose should be obtained 1 or more year(s)after the dose of PCV13 vaccine. An adult aged 19 years or older who has certain medical conditions and previously received 1 or more doses of PPSV23 vaccine should receive 1 dose of PCV13. The PCV13 vaccine dose should be obtained 1 or more years after the last PPSV23 vaccine dose.    PNEUMOVAX - Pneumococcal polysaccharide (PPSV23) vaccine. When PCV13 is also indicated, PCV13 should be obtained first. All adults aged 65 years and older should be immunized. An adult younger than age 65 years who  has certain medical conditions should be immunized. Any person who resides in a nursing home or long-term care facility should be immunized. An adult smoker should be immunized. People with an immunocompromised condition and certain other conditions should receive both PCV13 and PPSV23 vaccines. People with human immunodeficiency virus (HIV) infection should be immunized as soon as possible after diagnosis. Immunization during chemotherapy or radiation therapy should be avoided. Routine use of PPSV23 vaccine is not recommended for American Indians, [place] Natives, or people younger than 65 years unless there are medical conditions that require PPSV23 vaccine. When indicated, people who have unknown immunization and have no record of immunization should receive PPSV23 vaccine. One-time revaccination 5 years after the first dose of PPSV23 is recommended for people aged 19-64 years who have chronic kidney failure, nephrotic syndrome, asplenia, or immunocompromised conditions. People who received 1-2 doses of PPSV23 before age 65 years should receive another dose of PPSV23 vaccine at age 65 years or later if at least 5 years have passed since the previous dose. Doses of PPSV23 are not needed for people immunized with PPSV23 at or after age 65 years.    Hepatitis A vaccine. Adults who wish to be protected from this disease, have certain high-risk conditions, work with hepatitis A-infected animals, work in hepatitis A research labs, or travel to or work in countries with a high rate of hepatitis A should be immunized. Adults who were previously unvaccinated and who anticipate close contact with an international adoptee during the first 60 days after arrival in the [place] from a country with a high rate of hepatitis A should be immunized.    Hepatitis B vaccine. Adults should be immunized if they wish to be protected from this disease, have certain high-risk conditions, may be exposed to blood or other  infectious body fluids, are household contacts or sex partners of hepatitis B positive people, are clients or workers in certain care facilities,   or travel to or work in countries with a high rate of hepatitis B.   Preventive Service / Frequency   Ages 65 and over  Blood pressure check.  Lipid and cholesterol check.  Lung cancer screening. / Every year if you are aged 55-80 years and have a 30-pack-year history of smoking and currently smoke or have quit within the past 15 years. Yearly screening is stopped once you have quit smoking for at least 15 years or develop a health problem that would prevent you from having lung cancer treatment.  Fecal occult blood test (FOBT) of stool. You may not have to do this test if you get a colonoscopy every 10 years.  Flexible sigmoidoscopy** or colonoscopy.** / Every 5 years for a flexible sigmoidoscopy or every 10 years for a colonoscopy beginning at age 50 and continuing until age 75.  Hepatitis C blood test.** / For all people born from 1945 through 1965 and any individual with known risks for hepatitis C.  Abdominal aortic aneurysm (AAA) screening./ Screening current or former smokers or have Hypertension.  Skin self-exam. / Monthly.  Influenza vaccine. / Every year.  Tetanus, diphtheria, and acellular pertussis (Tdap/Td) vaccine.** / 1 dose of Td every 10 years.   Zoster vaccine.** / 1 dose for adults aged 60 years or older.         Pneumococcal 13-valent conjugate (PCV13) vaccine.    Pneumococcal polysaccharide (PPSV23) vaccine.     Hepatitis A vaccine.** / Consult your health care provider.  Hepatitis B vaccine.** / Consult your health care provider. Screening for abdominal aortic aneurysm (AAA)  by ultrasound is recommended for people who have history of high blood pressure or who are current or former smokers. ++++++++++ Recommend Adult Low Dose Aspirin or  coated  Aspirin 81 mg daily  To reduce risk of Colon Cancer 20 %,   Skin Cancer 26 % ,  Malignant Melanoma 46%  and  Pancreatic cancer 60% ++++++++++++++++++++++ Vitamin D goal  is between 70-100.  Please make sure that you are taking your Vitamin D as directed.  It is very important as a natural anti-inflammatory  helping hair, skin, and nails, as well as reducing stroke and heart attack risk.  It helps your bones and helps with mood. It also decreases numerous cancer risks so please take it as directed.  Low Vit D is associated with a 200-300% higher risk for CANCER  and 200-300% higher risk for HEART   ATTACK  &  STROKE.   ...................................... It is also associated with higher death rate at younger ages,  autoimmune diseases like Rheumatoid arthritis, Lupus, Multiple Sclerosis.    Also many other serious conditions, like depression, Alzheimer's Dementia, infertility, muscle aches, fatigue, fibromyalgia - just to name a few. ++++++++++++++++++++++ Recommend the book "The END of DIETING" by Dr Joel Fuhrman  & the book "The END of DIABETES " by Dr Joel Fuhrman At [employer] - get book & Audio CD's    Being diabetic has a  300% increased risk for heart attack, stroke, cancer, and alzheimer- type vascular dementia. It is very important that you work harder with diet by avoiding all foods that are white. Avoid white rice (brown & wild rice is OK), white potatoes (sweetpotatoes in moderation is OK), White bread or wheat bread or anything made out of white flour like bagels, donuts, rolls, buns, biscuits, cakes, pastries, cookies, pizza crust, and pasta (made from white flour & egg whites) - vegetarian pasta or spinach or wheat   pasta is OK. Multigrain breads like Arnold's or [employer], or multigrain sandwich thins or flatbreads.  Diet, exercise and weight loss can reverse and cure diabetes in the early stages.  Diet, exercise and weight loss is very important in the control and prevention of complications of diabetes which affects every  system in your body, ie. Brain - dementia/stroke, eyes - glaucoma/blindness, heart - heart attack/heart failure, kidneys - dialysis, stomach - gastric paralysis, intestines - malabsorption, nerves - severe painful neuritis, circulation - gangrene & loss of a leg(s), and finally cancer and Alzheimers.    I recommend avoid fried & greasy foods,  sweets/candy, white rice (brown or wild rice or Quinoa is OK), white potatoes (sweet potatoes are OK) - anything made from white flour - bagels, doughnuts, rolls, buns, biscuits,white and wheat breads, pizza crust and traditional pasta made of white flour & egg white(vegetarian pasta or spinach or wheat pasta is OK).  Multi-grain bread is OK - like multi-grain flat bread or sandwich thins. Avoid alcohol in excess. Exercise is also important.    Eat all the vegetables you want - avoid meat, especially red meat and dairy - especially cheese.  Cheese is the most concentrated form of trans-fats which is the worst thing to clog up our arteries. Veggie cheese is OK which can be found in the fresh produce section at [employer] or [employer] or [employer]  ++++++++++++++++++++++ DASH Eating Plan  DASH stands for "Dietary Approaches to Stop Hypertension."   The DASH eating plan is a healthy eating plan that has been shown to reduce high blood pressure (hypertension). Additional health benefits may include reducing the risk of type 2 diabetes mellitus, heart disease, and stroke. The DASH eating plan may also help with weight loss. WHAT DO I NEED TO KNOW ABOUT THE DASH EATING PLAN? For the DASH eating plan, you will follow these general guidelines:  Choose foods with a percent daily value for sodium of less than 5% (as listed on the food label).  Use salt-free seasonings or herbs instead of table salt or sea salt.  Check with your health care provider or pharmacist before using salt substitutes.  Eat lower-sodium products, often labeled as "lower sodium" or "no  salt added."  Eat fresh foods.  Eat more vegetables, fruits, and low-fat dairy products.  Choose whole grains. Look for the word "whole" as the first word in the ingredient list.  Choose fish   Limit sweets, desserts, sugars, and sugary drinks.  Choose heart-healthy fats.  Eat veggie cheese   Eat more home-cooked food and less restaurant, buffet, and fast food.  Limit fried foods.  Cook foods using methods other than frying.  Limit canned vegetables. If you do use them, rinse them well to decrease the sodium.  When eating at a restaurant, ask that your food be prepared with less salt, or no salt if possible.                      WHAT FOODS CAN I EAT? Read Dr Joel Fuhrman's books on The End of Dieting & The End of Diabetes  Grains Whole grain or whole wheat bread. Brown rice. Whole grain or whole wheat pasta. Quinoa, bulgur, and whole grain cereals. Low-sodium cereals. Corn or whole wheat flour tortillas. Whole grain cornbread. Whole grain crackers. Low-sodium crackers.  Vegetables Fresh or frozen vegetables (raw, steamed, roasted, or grilled). Low-sodium or reduced-sodium tomato and vegetable juices. Low-sodium or reduced-sodium tomato sauce and paste. Low-sodium   or reduced-sodium canned vegetables.   Fruits All fresh, canned (in natural juice), or frozen fruits.  Protein Products  All fish and seafood.  Dried beans, peas, or lentils. Unsalted nuts and seeds. Unsalted canned beans.  Dairy Low-fat dairy products, such as skim or 1% milk, 2% or reduced-fat cheeses, low-fat ricotta or cottage cheese, or plain low-fat yogurt. Low-sodium or reduced-sodium cheeses.  Fats and Oils Tub margarines without trans fats. Light or reduced-fat mayonnaise and salad dressings (reduced sodium). Avocado. Safflower, olive, or canola oils. Natural peanut or almond butter.  Other Unsalted popcorn and pretzels. The items listed above may not be a complete list of recommended foods or  beverages. Contact your dietitian for more options.  ++++++++++++++++++++  WHAT FOODS ARE NOT RECOMMENDED? Grains/ White flour or wheat flour White bread. White pasta. White rice. Refined cornbread. Bagels and croissants. Crackers that contain trans fat.  Vegetables  Creamed or fried vegetables. Vegetables in a . Regular canned vegetables. Regular canned tomato sauce and paste. Regular tomato and vegetable juices.  Fruits Dried fruits. Canned fruit in light or heavy syrup. Fruit juice.  Meat and Other Protein Products Meat in general - RED meat & White meat.  Fatty cuts of meat. Ribs, chicken wings, all processed meats as bacon, sausage, bologna, salami, fatback, hot dogs, bratwurst and packaged luncheon meats.  Dairy Whole or 2% milk, cream, half-and-half, and cream cheese. Whole-fat or sweetened yogurt. Full-fat cheeses or blue cheese. Non-dairy creamers and whipped toppings. Processed cheese, cheese spreads, or cheese curds.  Condiments Onion and garlic salt, seasoned salt, table salt, and sea salt. Canned and packaged gravies. Worcestershire sauce. Tartar sauce. Barbecue sauce. Teriyaki sauce. Soy sauce, including reduced sodium. Steak sauce. Fish sauce. Oyster sauce. Cocktail sauce. Horseradish. Ketchup and mustard. Meat flavorings and tenderizers. Bouillon cubes. Hot sauce. Tabasco sauce. Marinades. Taco seasonings. Relishes.  Fats and Oils Butter, stick margarine, lard, shortening and bacon fat. Coconut, palm kernel, or palm oils. Regular salad dressings.  Pickles and olives. Salted popcorn and pretzels.  The items listed above may not be a complete list of foods and beverages to avoid.    

## 2019-01-19 LAB — URINALYSIS, ROUTINE W REFLEX MICROSCOPIC
Bilirubin Urine: NEGATIVE
Glucose, UA: NEGATIVE
Hgb urine dipstick: NEGATIVE
Ketones, ur: NEGATIVE
Leukocytes,Ua: NEGATIVE
Nitrite: NEGATIVE
Protein, ur: NEGATIVE
Specific Gravity, Urine: 1.016 (ref 1.001–1.03)
pH: 5.5 (ref 5.0–8.0)

## 2019-01-19 LAB — CBC WITH DIFFERENTIAL/PLATELET
Absolute Monocytes: 421 cells/uL (ref 200–950)
Basophils Absolute: 73 cells/uL (ref 0–200)
Basophils Relative: 1.2 %
Eosinophils Absolute: 451 cells/uL (ref 15–500)
Eosinophils Relative: 7.4 %
HCT: 39 % (ref 38.5–50.0)
Hemoglobin: 13.3 g/dL (ref 13.2–17.1)
Lymphs Abs: 1848 cells/uL (ref 850–3900)
MCH: 30 pg (ref 27.0–33.0)
MCHC: 34.1 g/dL (ref 32.0–36.0)
MCV: 88 fL (ref 80.0–100.0)
MPV: 11 fL (ref 7.5–12.5)
Monocytes Relative: 6.9 %
Neutro Abs: 3306 cells/uL (ref 1500–7800)
Neutrophils Relative %: 54.2 %
Platelets: 245 10*3/uL (ref 140–400)
RBC: 4.43 10*6/uL (ref 4.20–5.80)
RDW: 13.1 % (ref 11.0–15.0)
Total Lymphocyte: 30.3 %
WBC: 6.1 10*3/uL (ref 3.8–10.8)

## 2019-01-19 LAB — COMPLETE METABOLIC PANEL WITH GFR
AG Ratio: 1.7 (calc) (ref 1.0–2.5)
ALT: 13 U/L (ref 9–46)
AST: 15 U/L (ref 10–35)
Albumin: 4.3 g/dL (ref 3.6–5.1)
Alkaline phosphatase (APISO): 70 U/L (ref 35–144)
BUN: 20 mg/dL (ref 7–25)
CO2: 27 mmol/L (ref 20–32)
Calcium: 9.4 mg/dL (ref 8.6–10.3)
Chloride: 104 mmol/L (ref 98–110)
Creat: 0.81 mg/dL (ref 0.70–1.25)
GFR, Est African American: 108 mL/min/{1.73_m2} (ref >=60)
GFR, Est Non African American: 93 mL/min/{1.73_m2} (ref >=60)
Globulin: 2.5 g/dL (calc) (ref 1.9–3.7)
Glucose, Bld: 173 mg/dL — ABNORMAL HIGH (ref 65–99)
Potassium: 4.3 mmol/L (ref 3.5–5.3)
Sodium: 139 mmol/L (ref 135–146)
Total Bilirubin: 0.6 mg/dL (ref 0.2–1.2)
Total Protein: 6.8 g/dL (ref 6.1–8.1)

## 2019-01-19 LAB — HEMOGLOBIN A1C
Hgb A1c MFr Bld: 6 % of total Hgb — ABNORMAL HIGH (ref <5.7)
Mean Plasma Glucose: 126 (calc)
eAG (mmol/L): 7 (calc)

## 2019-01-19 LAB — LIPID PANEL
Cholesterol: 162 mg/dL (ref <200)
HDL: 43 mg/dL (ref >=40)
LDL Cholesterol (Calc): 102 mg/dL (calc) — ABNORMAL HIGH
Non-HDL Cholesterol (Calc): 119 mg/dL (calc) (ref <130)
Total CHOL/HDL Ratio: 3.8 (calc) (ref <5.0)
Triglycerides: 82 mg/dL (ref <150)

## 2019-01-19 LAB — VITAMIN D 25 HYDROXY (VIT D DEFICIENCY, FRACTURES): Vit D, 25-Hydroxy: 51 ng/mL (ref 30–100)

## 2019-01-19 LAB — INSULIN, RANDOM: Insulin: 47.5 u[IU]/mL — ABNORMAL HIGH

## 2019-01-19 LAB — MICROALBUMIN / CREATININE URINE RATIO
Creatinine, Urine: 105 mg/dL (ref 20–320)
Microalb Creat Ratio: 15 mcg/mg creat (ref <30)
Microalb, Ur: 1.6 mg/dL

## 2019-01-19 LAB — TSH: TSH: 1.98 mIU/L (ref 0.40–4.50)

## 2019-01-19 LAB — MAGNESIUM: Magnesium: 1.6 mg/dL (ref 1.5–2.5)

## 2019-01-19 LAB — PSA: PSA: 2.3 ng/mL (ref <=4.0)

## 2019-02-01 DIAGNOSIS — M23303 Other meniscus derangements, unspecified medial meniscus, right knee: Secondary | ICD-10-CM | POA: Diagnosis not present

## 2019-04-18 NOTE — Progress Notes (Deleted)
FOLLOW UP  Assessment and Plan:   Hypertension Well controlled with current medications  Monitor blood pressure at home; patient to call if consistently greater than 130/80 Continue DASH diet.   Reminder to go to the ER if any CP, SOB, nausea, dizziness, severe HA, changes vision/speech, left arm numbness and tingling and jaw pain.  Cholesterol Currently at goal; continue statin Continue low cholesterol diet and exercise.  Check lipid panel.   Prediabetes Continue diet and exercise.  Perform daily foot/skin check, notify office of any concerning changes.  Check A1C annually at CPE; monitor weights and glucose by CMP  BMI 24-25 Long discussion about diet, and exercise Recommended diet heavy in fruits and veggies and low in animal meats, cheeses, and dairy products, appropriate calorie intake Discussed ideal weight for height - trying to get down to 155 lb Will follow up in 3 months  Vitamin D Def Near goal at recent check; *** ontinue to recommend supplementation for goal of 60-100 *** vitamin D level  OSA CPAP recommended; follow up as scheduled for fitment.   Continue diet and meds as discussed. Further disposition pending results of labs. Discussed med's effects and SE's.   Over 30 minutes of exam, counseling, chart review, and critical decision making was performed.   Future Appointments  Date Time Provider [place]  04/21/2019 10:45 AM Liane Comber, NP [facility] None  07/23/2019 10:30 AM Unk Pinto, MD [facility] None  02/17/2020 10:00 AM Unk Pinto, MD [facility] None    ----------------------------------------------------------------------------------------------------------------------  HPI 63 y.o. male  presents for 3 month follow up on hypertension, cholesterol, diabetes, weight and vitamin D deficiency.   He recently in January of 2019 completed a sleep study which demonstrated moderate to severe obstructive sleep apnea, with a total  AHI of 16.8/hour, REM AHI of 61.8/hour, supine AHI of 58.3/hour and O2 nadir of 76%. He was recommended treatment for this in the form of CPAP by Dr. Star Age. Scheduled for CPAP fitting on 3/3. ***  BMI is There is no height or weight on file to calculate BMI., he has been working on diet and exercise. He has been following a low carb diet.  Wt Readings from Last 3 Encounters:  01/18/19 171 lb 6.4 oz (77.7 kg)  09/28/18 164 lb 9.6 oz (74.7 kg)  08/10/18 168 lb (76.2 kg)   His blood pressure has been controlled at home, today their BP is    He does workout. He denies chest pain, shortness of breath, dizziness. Patient has been noted to have a soft murmur attributed to mild Tricuspid Regurgitation by 2D echo in 2015 - felt trivial/benign by Dr Marlou Porch.   He is on cholesterol medication (simvastatin *** mg daily) and denies myalgias. His cholesterol is not at goal. The cholesterol last visit was:   Lab Results  Component Value Date   CHOL 162 01/18/2019   HDL 43 01/18/2019   LDLCALC 102 (H) 01/18/2019   TRIG 82 01/18/2019   CHOLHDL 3.8 01/18/2019    He has been working on diet and exercise for prediabetes, and denies increased appetite, nausea, paresthesia of the feet, polydipsia, polyuria, visual disturbances, vomiting and weight loss. Last A1C in the office was:  Lab Results  Component Value Date   HGBA1C 6.0 (H) 01/18/2019   Patient is on Vitamin D supplement and approaching goal of 60:    Lab Results  Component Value Date   VD25OH 51 01/18/2019        Current Medications:  Current  Outpatient Medications on File Prior to Visit  Medication Sig  . aspirin EC 81 MG tablet Take 1 tablet daily  . Cholecalciferol 5000 units capsule Take 1 capsule (5,000 Units total) by mouth daily.  . simvastatin (ZOCOR) 40 MG tablet Take 1 tablet at Bedtime for Cholesterol  . valsartan (DIOVAN) 80 MG tablet TAKE 1 TABLET BY MOUTH EVERY DAY   No current facility-administered medications on file  prior to visit.      Allergies: No Known Allergies   Medical History:  Past Medical History:  Diagnosis Date  . Heart murmur   . Hyperlipidemia   . Hypertension   . Shingles   . Vitamin D deficiency    Family history- Reviewed and unchanged Social history- Reviewed and unchanged   Review of Systems:  Review of Systems  Constitutional: Negative for malaise/fatigue and weight loss.  HENT: Negative for hearing loss and tinnitus.   Eyes: Negative for blurred vision and double vision.  Respiratory: Negative for cough, shortness of breath and wheezing.   Cardiovascular: Negative for chest pain, palpitations, orthopnea, claudication and leg swelling.  Gastrointestinal: Negative for abdominal pain, blood in stool, constipation, diarrhea, heartburn, melena, nausea and vomiting.  Genitourinary: Negative.   Musculoskeletal: Negative for joint pain and myalgias.  Skin: Negative for rash.  Neurological: Negative for dizziness, tingling, sensory change, weakness and headaches.  Endo/Heme/Allergies: Negative for polydipsia.  Psychiatric/Behavioral: Negative.   All other systems reviewed and are negative.   Physical Exam: There were no vitals taken for this visit. Wt Readings from Last 3 Encounters:  01/18/19 171 lb 6.4 oz (77.7 kg)  09/28/18 164 lb 9.6 oz (74.7 kg)  08/10/18 168 lb (76.2 kg)   General Appearance: Well nourished, in no apparent distress. Eyes: PERRLA, EOMs, conjunctiva no swelling or erythema Sinuses: No Frontal/maxillary tenderness ENT/Mouth: Ext aud canals clear, TMs without erythema, bulging. No erythema, swelling, or exudate on post pharynx.  Tonsils not swollen or erythematous. Hearing normal.  Neck: Supple, thyroid normal.  Respiratory: Respiratory effort normal, BS equal bilaterally without rales, rhonchi, wheezing or stridor.  Cardio: RRR with no MRGs***. Brisk peripheral pulses without edema.  Abdomen: Soft, + BS.  Non tender, no guarding, rebound, hernias,  masses. Lymphatics: Non tender without lymphadenopathy.  Musculoskeletal: Full ROM, 5/5 strength, Normal gait Skin: Warm, dry without rashes, lesions, ecchymosis.  Neuro: Cranial nerves intact. No cerebellar symptoms.  Psych: Awake and oriented X 3, normal affect, Insight and Judgment appropriate.    Dan MakerAshley C Lejon Afzal, NP 7:27 PM [facility]

## 2019-04-21 ENCOUNTER — Ambulatory Visit: Payer: BLUE CROSS/BLUE SHIELD | Admitting: Adult Health

## 2019-05-09 ENCOUNTER — Other Ambulatory Visit: Payer: Self-pay | Admitting: Adult Health

## 2019-06-02 DIAGNOSIS — M23303 Other meniscus derangements, unspecified medial meniscus, right knee: Secondary | ICD-10-CM | POA: Diagnosis not present

## 2019-07-23 ENCOUNTER — Ambulatory Visit: Payer: BLUE CROSS/BLUE SHIELD | Admitting: Internal Medicine

## 2019-09-13 DIAGNOSIS — M23303 Other meniscus derangements, unspecified medial meniscus, right knee: Secondary | ICD-10-CM | POA: Diagnosis not present

## 2019-09-21 DIAGNOSIS — M25561 Pain in right knee: Secondary | ICD-10-CM | POA: Diagnosis not present

## 2019-09-27 DIAGNOSIS — M25561 Pain in right knee: Secondary | ICD-10-CM | POA: Diagnosis not present

## 2019-09-30 ENCOUNTER — Telehealth: Payer: Self-pay | Admitting: *Deleted

## 2019-09-30 NOTE — Telephone Encounter (Signed)
Surgical clearance and records faxed to [facility].

## 2019-10-04 ENCOUNTER — Telehealth: Payer: Self-pay | Admitting: Adult Health

## 2019-10-04 NOTE — Telephone Encounter (Signed)
I called pt after speaking to MM/NP and relayed that it is up to surgeon whether he is wanting a clearance from a cpap perspective.  (pt had cpap intially and was not compliant with it), His intial cpap visit.  He has not used the machine or had appt here since 06-08-2018.  I relayed that I did speak to [facility] at [facility] office.  It is up to surgeon whether clearance is required from [place].  If he want clearance from [place] then he will need to have appt and if has lost weight additional testing may be needed to see if needs cpap.  Pt will call there office back tomorrow and discuss.

## 2019-10-04 NOTE — Telephone Encounter (Signed)
VV is fine. But please inquire if he is using his CPAP- if not then no need for an appointment

## 2019-10-04 NOTE — Telephone Encounter (Signed)
I spoke to [name] [name].  He is planning on having total knee, he has had no weight loss from Epic.  Will be having done at Hospital. Since he has OSA and recommended cpap, what is receommended course for him?

## 2019-10-04 NOTE — Telephone Encounter (Signed)
I called pt and he is having surgery mid January.  He has lost weight and has not been using cpap.  No appt made.  He stated that [name] is aware he is not using machine.  LMVM for [name] about pt upcoming surgery.

## 2019-10-04 NOTE — Telephone Encounter (Signed)
If he has lost weight then we would consider repeating HST to confirm is apnea is still present. Our recommendation currently is for him to use CPAP

## 2019-10-04 NOTE — Telephone Encounter (Signed)
Patient called to advise that his surgeon for his upcoming knee surgery-Dr.Murphy weiner is requesting a surgical clearance. Please follow up.

## 2019-10-29 DIAGNOSIS — M25561 Pain in right knee: Secondary | ICD-10-CM | POA: Diagnosis not present

## 2019-11-03 ENCOUNTER — Encounter: Payer: Self-pay | Admitting: Internal Medicine

## 2019-11-08 ENCOUNTER — Encounter (HOSPITAL_COMMUNITY): Admission: RE | Admit: 2019-11-08 | Payer: BC Managed Care – PPO | Source: Ambulatory Visit

## 2019-11-08 ENCOUNTER — Other Ambulatory Visit (HOSPITAL_COMMUNITY): Payer: BLUE CROSS/BLUE SHIELD

## 2019-11-16 ENCOUNTER — Ambulatory Visit: Admit: 2019-11-16 | Payer: BLUE CROSS/BLUE SHIELD | Admitting: Orthopedic Surgery

## 2019-11-16 SURGERY — ARTHROPLASTY, KNEE, TOTAL
Anesthesia: Choice | Site: Knee | Laterality: Right

## 2019-12-20 DIAGNOSIS — M25561 Pain in right knee: Secondary | ICD-10-CM | POA: Diagnosis not present

## 2019-12-22 ENCOUNTER — Ambulatory Visit: Payer: Self-pay | Admitting: Physician Assistant

## 2019-12-22 NOTE — H&P (Signed)
TOTAL KNEE ADMISSION H&P  Patient is being admitted for right total knee arthroplasty.  Subjective:  Chief Complaint:right knee pain.  HPI: [name] [name], 64 y.o. male, has a history of pain and functional disability in the right knee due to arthritis and has failed non-surgical conservative treatments for greater than 12 weeks to includeNSAID's and/or analgesics, corticosteriod injections and activity modification.  Onset of symptoms was gradual, starting 6 years ago with gradually worsening course since that time. The patient noted no past surgery on the right knee(s).  Patient currently rates pain in the right knee(s) at 8 out of 10 with activity. Patient has night pain, worsening of pain with activity and weight bearing, pain that interferes with activities of daily living, crepitus and joint swelling.  Patient has evidence of periarticular osteophytes and joint space narrowing by imaging studies. There is no active infection.  Patient Active Problem List   Diagnosis Date Noted  . Fatigue 10/12/2018  . BMI 25.0-25.9,adult 12/02/2017  . Prediabetes 12/02/2017  . Vitamin D deficiency 12/02/2017  . OSA (obstructive sleep apnea) 12/02/2017  . Noncompliance 11/12/2014  . Heart murmur 02/18/2014  . Essential hypertension, benign 02/18/2014  . FH: heart disease 02/18/2014  . Hyperlipidemia, mixed    Past Medical History:  Diagnosis Date  . Heart murmur   . Hyperlipidemia   . Hypertension   . Shingles   . Vitamin D deficiency     Past Surgical History:  Procedure Laterality Date  . INGUINAL HERNIA REPAIR Bilateral 09/28/2015   Procedure: LAPAROSCOPIC BILATERAL INGUINAL HERNIA REPAIR;  Surgeon: Greer Pickerel, MD;  Location: [facility];  Service: General;  Laterality: Bilateral;  . INSERTION OF MESH Bilateral 09/28/2015   Procedure: INSERTION OF MESH;  Surgeon: Greer Pickerel, MD;  Location: [facility];  Service: General;  Laterality: Bilateral;  . SHOULDER ARTHROSCOPY      Current Outpatient  Medications  Medication Sig Dispense Refill Last Dose  . aspirin EC 81 MG tablet Take 1 tablet daily 1000 tablet 99   . Cholecalciferol 5000 units capsule Take 1 capsule (5,000 Units total) by mouth daily. 1000 capsule 99   . simvastatin (ZOCOR) 40 MG tablet Take 1 tablet at Bedtime for Cholesterol 90 tablet 3   . valsartan (DIOVAN) 80 MG tablet Take 1 tablet Daily for BP 90 tablet 3    No current facility-administered medications for this visit.   No Known Allergies  Social History   Tobacco Use  . Smoking status: Never Smoker  . Smokeless tobacco: Never Used  Substance Use Topics  . Alcohol use: Yes    Comment: OCCASIONAL BEER     Family History  Problem Relation Age of Onset  . Cancer Mother        colon, lung  . Heart disease Father   . Hyperlipidemia Father   . Heart attack Father      Review of Systems  Musculoskeletal: Positive for arthralgias and joint swelling.  All other systems reviewed and are negative.   Objective:  Physical Exam  Constitutional: He is oriented to person, place, and time. He appears well-developed and well-nourished. No distress.  HENT:  Head: Normocephalic and atraumatic.  Eyes: Pupils are equal, round, and reactive to light. Conjunctivae and EOM are normal.  Cardiovascular: Normal rate, regular rhythm and intact distal pulses.  Murmur heard. Respiratory: Effort normal and breath sounds normal. No respiratory distress. He has no wheezes.  GI: Soft. Bowel sounds are normal. He exhibits no distension. There is no abdominal tenderness.  Musculoskeletal:     Cervical back: Normal range of motion and neck supple.     Right knee: Swelling present. No effusion or erythema. Normal range of motion. Tenderness present over the medial joint line and lateral joint line.  Lymphadenopathy:    He has no cervical adenopathy.  Neurological: He is alert and oriented to person, place, and time.  Skin: Skin is warm and dry. No rash noted. No erythema.   Psychiatric: He has a normal mood and affect. His behavior is normal.    Vital signs in last 24 hours: @VSRANGES @  Labs:   Estimated body mass index is 25.31 kg/m as calculated from the following:   Height as of 01/18/19: 5\' 9"  (1.753 m).   Weight as of 01/18/19: 77.7 kg.   Imaging Review Plain radiographs demonstrate moderate degenerative joint disease of the right knee(s). The overall alignment ismild varus. The bone quality appears to be good for age and reported activity level.      Assessment/Plan:  End stage arthritis, right knee   The patient history, physical examination, clinical judgment of the provider and imaging studies are consistent with end stage degenerative joint disease of the right knee(s) and total knee arthroplasty is deemed medically necessary. The treatment options including medical management, injection therapy arthroscopy and arthroplasty were discussed at length. The risks and benefits of total knee arthroplasty were presented and reviewed. The risks due to aseptic loosening, infection, stiffness, patella tracking problems, thromboembolic complications and other imponderables were discussed. The patient acknowledged the explanation, agreed to proceed with the plan and consent was signed. Patient is being admitted for inpatient treatment for surgery, pain control, PT, OT, prophylactic antibiotics, VTE prophylaxis, progressive ambulation and ADL's and discharge planning. The patient is planning to be discharged home with outpt PT.    Anticipated LOS equal to or greater than 2 midnights due to - Age 38 and older with one or more of the following:  - Obesity  - Expected need for hospital services (PT, OT, Nursing) required for safe  discharge  - Anticipated need for postoperative skilled nursing care or inpatient rehab  - Active co-morbidities: None OR   - Unanticipated findings during/Post Surgery: None  - Patient is a high risk of re-admission due to:  None

## 2019-12-22 NOTE — H&P (View-Only) (Signed)
TOTAL KNEE ADMISSION H&P  Patient is being admitted for right total knee arthroplasty.  Subjective:  Chief Complaint:right knee pain.  HPI: [name] [name], 64 y.o. male, has a history of pain and functional disability in the right knee due to arthritis and has failed non-surgical conservative treatments for greater than 12 weeks to includeNSAID's and/or analgesics, corticosteriod injections and activity modification.  Onset of symptoms was gradual, starting 6 years ago with gradually worsening course since that time. The patient noted no past surgery on the right knee(s).  Patient currently rates pain in the right knee(s) at 8 out of 10 with activity. Patient has night pain, worsening of pain with activity and weight bearing, pain that interferes with activities of daily living, crepitus and joint swelling.  Patient has evidence of periarticular osteophytes and joint space narrowing by imaging studies. There is no active infection.  Patient Active Problem List   Diagnosis Date Noted  . Fatigue 10/12/2018  . BMI 25.0-25.9,adult 12/02/2017  . Prediabetes 12/02/2017  . Vitamin D deficiency 12/02/2017  . OSA (obstructive sleep apnea) 12/02/2017  . Noncompliance 11/12/2014  . Heart murmur 02/18/2014  . Essential hypertension, benign 02/18/2014  . FH: heart disease 02/18/2014  . Hyperlipidemia, mixed    Past Medical History:  Diagnosis Date  . Heart murmur   . Hyperlipidemia   . Hypertension   . Shingles   . Vitamin D deficiency     Past Surgical History:  Procedure Laterality Date  . INGUINAL HERNIA REPAIR Bilateral 09/28/2015   Procedure: LAPAROSCOPIC BILATERAL INGUINAL HERNIA REPAIR;  Surgeon: Eric [name], MD;  Location: [facility];  Service: General;  Laterality: Bilateral;  . INSERTION OF MESH Bilateral 09/28/2015   Procedure: INSERTION OF MESH;  Surgeon: Eric [name], MD;  Location: [facility];  Service: General;  Laterality: Bilateral;  . SHOULDER ARTHROSCOPY      Current Outpatient  Medications  Medication Sig Dispense Refill Last Dose  . aspirin EC 81 MG tablet Take 1 tablet daily 1000 tablet 99   . Cholecalciferol 5000 units capsule Take 1 capsule (5,000 Units total) by mouth daily. 1000 capsule 99   . simvastatin (ZOCOR) 40 MG tablet Take 1 tablet at Bedtime for Cholesterol 90 tablet 3   . valsartan (DIOVAN) 80 MG tablet Take 1 tablet Daily for BP 90 tablet 3    No current facility-administered medications for this visit.   No Known Allergies  Social History   Tobacco Use  . Smoking status: Never Smoker  . Smokeless tobacco: Never Used  Substance Use Topics  . Alcohol use: Yes    Comment: OCCASIONAL BEER     Family History  Problem Relation Age of Onset  . Cancer Mother        colon, lung  . Heart disease Father   . Hyperlipidemia Father   . Heart attack Father      Review of Systems  Musculoskeletal: Positive for arthralgias and joint swelling.  All other systems reviewed and are negative.   Objective:  Physical Exam  Constitutional: He is oriented to person, place, and time. He appears well-developed and well-nourished. No distress.  HENT:  Head: Normocephalic and atraumatic.  Eyes: Pupils are equal, round, and reactive to light. Conjunctivae and EOM are normal.  Cardiovascular: Normal rate, regular rhythm and intact distal pulses.  Murmur heard. Respiratory: Effort normal and breath sounds normal. No respiratory distress. He has no wheezes.  GI: Soft. Bowel sounds are normal. He exhibits no distension. There is no abdominal tenderness.    Musculoskeletal:     Cervical back: Normal range of motion and neck supple.     Right knee: Swelling present. No effusion or erythema. Normal range of motion. Tenderness present over the medial joint line and lateral joint line.  Lymphadenopathy:    He has no cervical adenopathy.  Neurological: He is alert and oriented to person, place, and time.  Skin: Skin is warm and dry. No rash noted. No erythema.   Psychiatric: He has a normal mood and affect. His behavior is normal.    Vital signs in last 24 hours: @VSRANGES @  Labs:   Estimated body mass index is 25.31 kg/m as calculated from the following:   Height as of 01/18/19: 5\' 9"  (1.753 m).   Weight as of 01/18/19: 77.7 kg.   Imaging Review Plain radiographs demonstrate moderate degenerative joint disease of the right knee(s). The overall alignment ismild varus. The bone quality appears to be good for age and reported activity level.      Assessment/Plan:  End stage arthritis, right knee   The patient history, physical examination, clinical judgment of the provider and imaging studies are consistent with end stage degenerative joint disease of the right knee(s) and total knee arthroplasty is deemed medically necessary. The treatment options including medical management, injection therapy arthroscopy and arthroplasty were discussed at length. The risks and benefits of total knee arthroplasty were presented and reviewed. The risks due to aseptic loosening, infection, stiffness, patella tracking problems, thromboembolic complications and other imponderables were discussed. The patient acknowledged the explanation, agreed to proceed with the plan and consent was signed. Patient is being admitted for inpatient treatment for surgery, pain control, PT, OT, prophylactic antibiotics, VTE prophylaxis, progressive ambulation and ADL's and discharge planning. The patient is planning to be discharged home with outpt PT.    Anticipated LOS equal to or greater than 2 midnights due to - Age 38 and older with one or more of the following:  - Obesity  - Expected need for hospital services (PT, OT, Nursing) required for safe  discharge  - Anticipated need for postoperative skilled nursing care or inpatient rehab  - Active co-morbidities: None OR   - Unanticipated findings during/Post Surgery: None  - Patient is a high risk of re-admission due to:  None

## 2019-12-31 ENCOUNTER — Encounter (HOSPITAL_COMMUNITY): Payer: Self-pay

## 2019-12-31 ENCOUNTER — Other Ambulatory Visit: Payer: Self-pay

## 2019-12-31 ENCOUNTER — Encounter (HOSPITAL_COMMUNITY)
Admission: RE | Admit: 2019-12-31 | Discharge: 2019-12-31 | Disposition: A | Discharge status: Home / Self Care | Payer: BC Managed Care – PPO | Source: Ambulatory Visit | Attending: Orthopedic Surgery | Admitting: Orthopedic Surgery

## 2019-12-31 DIAGNOSIS — M1711 Unilateral primary osteoarthritis, right knee: Secondary | ICD-10-CM | POA: Insufficient documentation

## 2019-12-31 DIAGNOSIS — G4733 Obstructive sleep apnea (adult) (pediatric): Secondary | ICD-10-CM | POA: Insufficient documentation

## 2019-12-31 DIAGNOSIS — Z79899 Other long term (current) drug therapy: Secondary | ICD-10-CM | POA: Diagnosis not present

## 2019-12-31 DIAGNOSIS — Z01812 Encounter for preprocedural laboratory examination: Secondary | ICD-10-CM | POA: Insufficient documentation

## 2019-12-31 DIAGNOSIS — I10 Essential (primary) hypertension: Secondary | ICD-10-CM | POA: Insufficient documentation

## 2019-12-31 LAB — COMPREHENSIVE METABOLIC PANEL
ALT: 14 U/L (ref 0–44)
AST: 20 U/L (ref 15–41)
Albumin: 4 g/dL (ref 3.5–5.0)
Alkaline Phosphatase: 54 U/L (ref 38–126)
Anion gap: 5 (ref 5–15)
BUN: 18 mg/dL (ref 8–23)
CO2: 29 mmol/L (ref 22–32)
Calcium: 9.1 mg/dL (ref 8.9–10.3)
Chloride: 108 mmol/L (ref 98–111)
Creatinine, Ser: 0.84 mg/dL (ref 0.61–1.24)
GFR calc Af Amer: >60 mL/min (ref >60)
GFR calc non Af Amer: >60 mL/min (ref >60)
Glucose, Bld: 90 mg/dL (ref 70–99)
Potassium: 4.6 mmol/L (ref 3.5–5.1)
Sodium: 142 mmol/L (ref 135–145)
Total Bilirubin: 0.7 mg/dL (ref 0.3–1.2)
Total Protein: 6.8 g/dL (ref 6.5–8.1)

## 2019-12-31 LAB — CBC WITH DIFFERENTIAL/PLATELET
Abs Immature Granulocytes: 0.01 10*3/uL (ref 0.00–0.07)
Basophils Absolute: 0.1 10*3/uL (ref 0.0–0.1)
Basophils Relative: 1 %
Eosinophils Absolute: 0.3 10*3/uL (ref 0.0–0.5)
Eosinophils Relative: 4 %
HCT: 37.3 % — ABNORMAL LOW (ref 39.0–52.0)
Hemoglobin: 12.2 g/dL — ABNORMAL LOW (ref 13.0–17.0)
Immature Granulocytes: 0 %
Lymphocytes Relative: 31 %
Lymphs Abs: 2.2 10*3/uL (ref 0.7–4.0)
MCH: 29.9 pg (ref 26.0–34.0)
MCHC: 32.7 g/dL (ref 30.0–36.0)
MCV: 91.4 fL (ref 80.0–100.0)
Monocytes Absolute: 0.6 10*3/uL (ref 0.1–1.0)
Monocytes Relative: 8 %
Neutro Abs: 3.9 10*3/uL (ref 1.7–7.7)
Neutrophils Relative %: 56 %
Platelets: 224 10*3/uL (ref 150–400)
RBC: 4.08 MIL/uL — ABNORMAL LOW (ref 4.22–5.81)
RDW: 13.2 % (ref 11.5–15.5)
WBC: 7 10*3/uL (ref 4.0–10.5)
nRBC: 0 % (ref 0.0–0.2)

## 2019-12-31 LAB — TYPE AND SCREEN
ABO/RH(D): A POS
Antibody Screen: NEGATIVE

## 2019-12-31 LAB — SURGICAL PCR SCREEN
MRSA, PCR: NEGATIVE
Staphylococcus aureus: NEGATIVE

## 2019-12-31 LAB — PROTIME-INR
INR: 1 (ref 0.8–1.2)
Prothrombin Time: 13.4 seconds (ref 11.4–15.2)

## 2019-12-31 LAB — ABO/RH: ABO/RH(D): A POS

## 2019-12-31 LAB — HEMOGLOBIN A1C
Hgb A1c MFr Bld: 6.1 % — ABNORMAL HIGH (ref 4.8–5.6)
Mean Plasma Glucose: 128.37 mg/dL

## 2019-12-31 LAB — APTT: aPTT: 29 seconds (ref 24–36)

## 2019-12-31 NOTE — Progress Notes (Signed)
PCP - Dr. Rosalita Levan Cardiologist -   Chest x-ray -  EKG -  Stress Test -  ECHO -  Cardiac Cath -   Sleep Study -  CPAP -   Fasting Blood Sugar -  Checks Blood Sugar _____ times a day  Blood Thinner Instructions: Aspirin Instructions: To stop a week before surgery. Last Dose:  Anesthesia review:   Patient denies shortness of breath, fever, cough and chest pain at PAT appointment   Patient verbalized understanding of instructions that were given to them at the PAT appointment. Patient was also instructed that they will need to review over the PAT instructions again at home before surgery.

## 2019-12-31 NOTE — Patient Instructions (Signed)
DUE TO COVID-19 ONLY TWO VISITOR IS ALLOWED TO COME WITH YOU AND STAY IN THE WAITING ROOM ONLY DURING PRE OP AND PROCEDURE DAY OF SURGERY. THE 2 VISITORS MAY VISIT WITH YOU AFTER SURGERY IN YOUR PRIVATE ROOM DURING VISITING HOURS ONLY!  YOU NEED TO HAVE A COVID 19 TEST ON: 01/07/20 @ 8:50 am, THIS TEST MUST BE DONE BEFORE SURGERY, COME  [facility], [address] , [id number].  ([place]) ONCE YOUR COVID TEST IS COMPLETED, PLEASE BEGIN THE QUARANTINE INSTRUCTIONS AS OUTLINED IN YOUR HANDOUT.                [name] [name]    Your procedure is scheduled on: 01/11/20   Report to [facility] Main  Entrance   Report to SHORT STAY at: 5:30 am AM     Call this number if you have problems the morning of surgery [phone]    Remember:    [place], NO CHEWING GUM [place].     You may not have any metal on your body including hair pins and              piercings  Do not wear jewelry, lotions, powders or perfumes, deodorant             Men may shave face and neck.   Do not bring valuables to the hospital. [place].  Contacts, dentures or bridgework may not be worn into surgery.  Leave suitcase in the car. After surgery it may be brought to your room.     Patients discharged the day of surgery will not be allowed to drive home. IF YOU ARE HAVING SURGERY AND GOING HOME THE SAME DAY, YOU MUST HAVE AN ADULT TO DRIVE YOU HOME AND BE WITH YOU FOR 24 HOURS. YOU MAY GO HOME BY TAXI OR UBER OR ORTHERWISE, BUT AN ADULT MUST ACCOMPANY YOU HOME AND STAY WITH YOU FOR 24 HOURS.  Name and phone number of your driver:  Special Instructions: N/A              Please read over the following fact sheets you were given: _____________________________________________________________________             NO SOLID FOOD AFTER MIDNIGHT THE NIGHT PRIOR TO SURGERY. NOTHING BY  MOUTH EXCEPT CLEAR LIQUIDS UNTIL: 4:30 AM  . PLEASE FINISH GATORADE DRINK PER SURGEON ORDER  WHICH NEEDS TO BE COMPLETED AT: 4:30 AM .   CLEAR LIQUID DIET   Foods Allowed                                                                     Foods Excluded  Coffee and tea, regular and decaf                             liquids that you cannot  Plain Jell-O any favor except red or purple  see through such as: Fruit ices (not with fruit pulp)                                     milk, soups, orange juice  Iced Popsicles                                    All solid food Carbonated beverages, regular and diet                                    Cranberry, grape and apple juices Sports drinks like Gatorade Lightly seasoned clear broth or consume(fat free) Sugar, honey syrup  Sample Menu Breakfast                                Lunch                                     Supper Cranberry juice                    Beef broth                            Chicken broth Jell-O                                     Grape juice                           Apple juice Coffee or tea                        Jell-O                                      Popsicle                                                Coffee or tea                        Coffee or tea  _____________________________________________________________________  [facility] - Preparing for Surgery Before surgery, you can play an important role.  Because skin is not sterile, your skin needs to be as free of germs as possible.  You can reduce the number of germs on your skin by washing with CHG (chlorahexidine gluconate) soap before surgery.  CHG is an antiseptic cleaner which kills germs and bonds with the skin to continue killing germs even after washing. Please DO NOT use if you have an allergy to CHG or antibacterial soaps.  If your skin becomes reddened/irritated stop using the CHG and inform your nurse when  you arrive at Short Stay. Do not shave (including legs and underarms)  for at least 48 hours prior to the first CHG shower.  You may shave your face/neck. Please follow these instructions carefully:  1.  Shower with CHG Soap the night before surgery and the  morning of Surgery.  2.  If you choose to wash your hair, wash your hair first as usual with your  normal  shampoo.  3.  After you shampoo, rinse your hair and body thoroughly to remove the  shampoo.                           4.  Use CHG as you would any other liquid soap.  You can apply chg directly  to the skin and wash                       Gently with a scrungie or clean washcloth.  5.  Apply the CHG Soap to your body ONLY FROM THE NECK DOWN.   Do not use on face/ open                           Wound or open sores. Avoid contact with eyes, ears mouth and genitals (private parts).                       Wash face,  Genitals (private parts) with your normal soap.             6.  Wash thoroughly, paying special attention to the area where your surgery  will be performed.  7.  Thoroughly rinse your body with warm water from the neck down.  8.  DO NOT shower/wash with your normal soap after using and rinsing off  the CHG Soap.                9.  Pat yourself dry with a clean towel.            10.  Wear clean pajamas.            11.  Place clean sheets on your bed the night of your first shower and do not  sleep with pets. Day of Surgery : Do not apply any lotions/deodorants the morning of surgery.  Please wear clean clothes to the hospital/surgery center.  FAILURE TO FOLLOW THESE INSTRUCTIONS MAY RESULT IN THE CANCELLATION OF YOUR SURGERY PATIENT SIGNATURE_________________________________  NURSE SIGNATURE__________________________________  ________________________________________________________________________   [name] [name]  An incentive spirometer is a tool that can help keep your lungs clear and active. This tool measures  how well you are filling your lungs with each breath. Taking long deep breaths may help reverse or decrease the chance of developing breathing (pulmonary) problems (especially infection) following:  A long period of time when you are unable to move or be active. BEFORE THE PROCEDURE   If the spirometer includes an indicator to show your best effort, your nurse or respiratory therapist will set it to a desired goal.  If possible, sit up straight or lean slightly forward. Try not to slouch.  Hold the incentive spirometer in an upright position. INSTRUCTIONS FOR USE  1. Sit on the edge of your bed if possible, or sit up as far as you can in bed or on a chair. 2. Hold the incentive spirometer in an upright position. 3. Breathe out normally. 4. Place the mouthpiece in your mouth and seal your lips tightly  around it. 5. Breathe in slowly and as deeply as possible, raising the piston or the ball toward the top of the column. 6. Hold your breath for 3-5 seconds or for as long as possible. Allow the piston or ball to fall to the bottom of the column. 7. Remove the mouthpiece from your mouth and breathe out normally. 8. Rest for a few seconds and repeat Steps 1 through 7 at least 10 times every 1-2 hours when you are awake. Take your time and take a few normal breaths between deep breaths. 9. The spirometer may include an indicator to show your best effort. Use the indicator as a goal to work toward during each repetition. 10. After each set of 10 deep breaths, practice coughing to be sure your lungs are clear. If you have an incision (the cut made at the time of surgery), support your incision when coughing by placing a pillow or rolled up towels firmly against it. Once you are able to get out of bed, walk around indoors and cough well. You may stop using the incentive spirometer when instructed by your caregiver.  RISKS AND COMPLICATIONS  Take your time so you do not get dizzy or light-headed.  If  you are in pain, you may need to take or ask for pain medication before doing incentive spirometry. It is harder to take a deep breath if you are having pain. AFTER USE  Rest and breathe slowly and easily.  It can be helpful to keep track of a log of your progress. Your caregiver can provide you with a simple table to help with this. If you are using the spirometer at home, follow these instructions: SEEK MEDICAL CARE IF:   You are having difficultly using the spirometer.  You have trouble using the spirometer as often as instructed.  Your pain medication is not giving enough relief while using the spirometer.  You develop fever of 100.5 F (38.1 C) or higher. SEEK IMMEDIATE MEDICAL CARE IF:   You cough up bloody sputum that had not been present before.  You develop fever of 102 F (38.9 C) or greater.  You develop worsening pain at or near the incision site. MAKE SURE YOU:   Understand these instructions.  Will watch your condition.  Will get help right away if you are not doing well or get worse. Document Released: 02/10/2007 Document Revised: 12/23/2011 Document Reviewed: 04/13/2007 [facility] Patient Information 2014 [place], [place].   ________________________________________________________________________

## 2020-01-07 ENCOUNTER — Other Ambulatory Visit (HOSPITAL_COMMUNITY)
Admission: RE | Admit: 2020-01-07 | Discharge: 2020-01-07 | Disposition: A | Discharge status: Home / Self Care | Payer: BC Managed Care – PPO | Source: Ambulatory Visit | Attending: Orthopedic Surgery | Admitting: Orthopedic Surgery

## 2020-01-07 DIAGNOSIS — Z20822 Contact with and (suspected) exposure to covid-19: Secondary | ICD-10-CM | POA: Insufficient documentation

## 2020-01-07 DIAGNOSIS — Z01812 Encounter for preprocedural laboratory examination: Secondary | ICD-10-CM | POA: Diagnosis not present

## 2020-01-07 LAB — SARS CORONAVIRUS 2 (TAT 6-24 HRS): SARS Coronavirus 2: NEGATIVE

## 2020-01-10 ENCOUNTER — Encounter (HOSPITAL_COMMUNITY): Payer: Self-pay | Admitting: Orthopedic Surgery

## 2020-01-10 NOTE — Anesthesia Preprocedure Evaluation (Addendum)
Anesthesia Evaluation  Patient identified by MRN, date of birth, ID band Patient awake    Reviewed: Allergy & Precautions, NPO status , Patient's Chart, lab work & pertinent test results  Airway Mallampati: II  TM Distance: >3 FB Neck ROM: Full    Dental no notable dental hx. (+) Teeth Intact   Pulmonary sleep apnea and Continuous Positive Airway Pressure Ventilation ,    Pulmonary exam normal breath sounds clear to auscultation       Cardiovascular hypertension, Pt. on medications Normal cardiovascular exam+ Valvular Problems/Murmurs  Rhythm:Regular Rate:Normal     Neuro/Psych negative neurological ROS  negative psych ROS   GI/Hepatic negative GI ROS, Neg liver ROS,   Endo/Other  Hyperlipidemia  Renal/GU negative Renal ROS  negative genitourinary   Musculoskeletal  (+) Arthritis , Osteoarthritis,  OA Right knee   Abdominal   Peds  Hematology negative hematology ROS (+)   Anesthesia Other Findings   Reproductive/Obstetrics                            Anesthesia Physical Anesthesia Plan  ASA: II  Anesthesia Plan: Spinal   Post-op Pain Management:  Regional for Post-op pain   Induction: Intravenous  PONV Risk Score and Plan: 2 and Ondansetron, Propofol infusion, Midazolam and Treatment may vary due to age or medical condition  Airway Management Planned: Natural Airway and Simple Face Mask  Additional Equipment:   Intra-op Plan:   Post-operative Plan:   Informed Consent: I have reviewed the patients History and Physical, chart, labs and discussed the procedure including the risks, benefits and alternatives for the proposed anesthesia with the patient or authorized representative who has indicated his/her understanding and acceptance.     Dental advisory given  Plan Discussed with: CRNA and Surgeon  Anesthesia Plan Comments:        Anesthesia Quick Evaluation

## 2020-01-11 ENCOUNTER — Ambulatory Visit (HOSPITAL_COMMUNITY): Payer: BC Managed Care – PPO | Admitting: Certified Registered"

## 2020-01-11 ENCOUNTER — Ambulatory Visit (HOSPITAL_COMMUNITY)
Admission: RE | Admit: 2020-01-11 | Discharge: 2020-01-11 | Disposition: A | Discharge status: Home / Self Care | Payer: BC Managed Care – PPO | Attending: Orthopedic Surgery | Admitting: Orthopedic Surgery

## 2020-01-11 ENCOUNTER — Ambulatory Visit (HOSPITAL_COMMUNITY): Payer: BC Managed Care – PPO

## 2020-01-11 ENCOUNTER — Encounter (HOSPITAL_COMMUNITY)
Admission: RE | Disposition: A | Discharge status: Home / Self Care | Payer: Self-pay | Source: Home / Self Care | Attending: Orthopedic Surgery

## 2020-01-11 ENCOUNTER — Encounter (HOSPITAL_COMMUNITY): Payer: Self-pay | Admitting: Orthopedic Surgery

## 2020-01-11 ENCOUNTER — Other Ambulatory Visit: Payer: Self-pay

## 2020-01-11 DIAGNOSIS — E785 Hyperlipidemia, unspecified: Secondary | ICD-10-CM | POA: Diagnosis not present

## 2020-01-11 DIAGNOSIS — Z7982 Long term (current) use of aspirin: Secondary | ICD-10-CM | POA: Insufficient documentation

## 2020-01-11 DIAGNOSIS — E782 Mixed hyperlipidemia: Secondary | ICD-10-CM | POA: Diagnosis not present

## 2020-01-11 DIAGNOSIS — I10 Essential (primary) hypertension: Secondary | ICD-10-CM | POA: Diagnosis not present

## 2020-01-11 DIAGNOSIS — R7303 Prediabetes: Secondary | ICD-10-CM | POA: Diagnosis not present

## 2020-01-11 DIAGNOSIS — M1711 Unilateral primary osteoarthritis, right knee: Secondary | ICD-10-CM | POA: Diagnosis present

## 2020-01-11 DIAGNOSIS — E669 Obesity, unspecified: Secondary | ICD-10-CM | POA: Insufficient documentation

## 2020-01-11 DIAGNOSIS — Z471 Aftercare following joint replacement surgery: Secondary | ICD-10-CM | POA: Diagnosis not present

## 2020-01-11 DIAGNOSIS — G8918 Other acute postprocedural pain: Secondary | ICD-10-CM | POA: Diagnosis not present

## 2020-01-11 DIAGNOSIS — Z79899 Other long term (current) drug therapy: Secondary | ICD-10-CM | POA: Diagnosis not present

## 2020-01-11 DIAGNOSIS — E559 Vitamin D deficiency, unspecified: Secondary | ICD-10-CM | POA: Diagnosis not present

## 2020-01-11 DIAGNOSIS — G4733 Obstructive sleep apnea (adult) (pediatric): Secondary | ICD-10-CM | POA: Diagnosis not present

## 2020-01-11 DIAGNOSIS — Z96651 Presence of right artificial knee joint: Secondary | ICD-10-CM | POA: Diagnosis not present

## 2020-01-11 DIAGNOSIS — Z6825 Body mass index (BMI) 25.0-25.9, adult: Secondary | ICD-10-CM | POA: Insufficient documentation

## 2020-01-11 DIAGNOSIS — Z96659 Presence of unspecified artificial knee joint: Secondary | ICD-10-CM

## 2020-01-11 HISTORY — PX: TOTAL KNEE ARTHROPLASTY: SHX125

## 2020-01-11 SURGERY — ARTHROPLASTY, KNEE, TOTAL
Anesthesia: Spinal | Site: Knee | Laterality: Right

## 2020-01-11 MED ORDER — GABAPENTIN 300 MG PO CAPS: 300.0000 mg | ORAL_CAPSULE | Freq: Two times a day (BID) | ORAL | 0 refills | Status: DC

## 2020-01-11 MED ORDER — MIDAZOLAM HCL 2 MG/2ML IJ SOLN
INTRAMUSCULAR | Status: DC | PRN
  Administered 2020-01-11 (×2): 1 mg via INTRAVENOUS

## 2020-01-11 MED ORDER — BACLOFEN 10 MG PO TABS: 10.0000 mg | ORAL_TABLET | Freq: Three times a day (TID) | ORAL | 0 refills | Status: DC | PRN

## 2020-01-11 MED ORDER — ROPIVACAINE HCL 7.5 MG/ML IJ SOLN
INTRAMUSCULAR | Status: DC | PRN
  Administered 2020-01-11: 20 mL via PERINEURAL

## 2020-01-11 MED ORDER — TRANEXAMIC ACID-NACL 1000-0.7 MG/100ML-% IV SOLN
1000.0000 mg | INTRAVENOUS | Status: AC
  Administered 2020-01-11: 1000 mg via INTRAVENOUS

## 2020-01-11 MED ORDER — 0.9 % SODIUM CHLORIDE (POUR BTL) OPTIME
TOPICAL | Status: DC | PRN
  Administered 2020-01-11: 1000 mL

## 2020-01-11 MED ORDER — ASPIRIN EC 81 MG PO TBEC: 81.0000 mg | DELAYED_RELEASE_TABLET | Freq: Two times a day (BID) | ORAL | 0 refills | Status: DC

## 2020-01-11 MED ORDER — TRANEXAMIC ACID-NACL 1000-0.7 MG/100ML-% IV SOLN
INTRAVENOUS | Status: AC
  Filled 2020-01-11: qty 100

## 2020-01-11 MED ORDER — OMEPRAZOLE 20 MG PO CPDR: 20.0000 mg | DELAYED_RELEASE_CAPSULE | Freq: Every day | ORAL | 0 refills | Status: DC

## 2020-01-11 MED ORDER — ONDANSETRON HCL 4 MG PO TABS: 4.0000 mg | ORAL_TABLET | Freq: Three times a day (TID) | ORAL | 0 refills | Status: DC | PRN

## 2020-01-11 MED ORDER — CEFAZOLIN SODIUM-DEXTROSE 2-4 GM/100ML-% IV SOLN
INTRAVENOUS | Status: AC
  Filled 2020-01-11: qty 100

## 2020-01-11 MED ORDER — CELECOXIB 200 MG PO CAPS: 200.0000 mg | ORAL_CAPSULE | Freq: Two times a day (BID) | ORAL | 0 refills | Status: AC

## 2020-01-11 MED ORDER — ONDANSETRON HCL 4 MG/2ML IJ SOLN
INTRAMUSCULAR | Status: DC | PRN
  Administered 2020-01-11: 4 mg via INTRAVENOUS

## 2020-01-11 MED ORDER — CEFAZOLIN SODIUM-DEXTROSE 1-4 GM/50ML-% IV SOLN
1.0000 g | Freq: Four times a day (QID) | INTRAVENOUS | Status: AC
  Administered 2020-01-11: 1 g via INTRAVENOUS
  Filled 2020-01-11: qty 50

## 2020-01-11 MED ORDER — SODIUM CHLORIDE (PF) 0.9 % IJ SOLN
INTRAMUSCULAR | Status: AC
  Filled 2020-01-11: qty 30

## 2020-01-11 MED ORDER — HYDROMORPHONE HCL 1 MG/ML IJ SOLN
INTRAMUSCULAR | Status: AC
  Filled 2020-01-11: qty 2

## 2020-01-11 MED ORDER — LACTATED RINGERS IV SOLN: INTRAVENOUS | Status: DC | PRN

## 2020-01-11 MED ORDER — SODIUM CHLORIDE 0.9 % IV SOLN: INTRAVENOUS | Status: DC

## 2020-01-11 MED ORDER — PROPOFOL 10 MG/ML IV BOLUS
INTRAVENOUS | Status: AC
  Filled 2020-01-11: qty 20

## 2020-01-11 MED ORDER — DEXAMETHASONE SODIUM PHOSPHATE 10 MG/ML IJ SOLN
INTRAMUSCULAR | Status: AC
  Filled 2020-01-11: qty 1

## 2020-01-11 MED ORDER — ONDANSETRON HCL 4 MG/2ML IJ SOLN
INTRAMUSCULAR | Status: AC
  Filled 2020-01-11: qty 2

## 2020-01-11 MED ORDER — CEFAZOLIN SODIUM-DEXTROSE 2-4 GM/100ML-% IV SOLN
2.0000 g | INTRAVENOUS | Status: AC
  Administered 2020-01-11: 2 g via INTRAVENOUS

## 2020-01-11 MED ORDER — PROMETHAZINE HCL 25 MG/ML IJ SOLN: 6.2500 mg | INTRAMUSCULAR | Status: DC | PRN

## 2020-01-11 MED ORDER — MIDAZOLAM HCL 2 MG/2ML IJ SOLN
INTRAMUSCULAR | Status: AC
  Filled 2020-01-11: qty 2

## 2020-01-11 MED ORDER — METHOCARBAMOL 500 MG IVPB - SIMPLE MED
500.0000 mg | Freq: Four times a day (QID) | INTRAVENOUS | Status: DC | PRN
  Administered 2020-01-11: 500 mg via INTRAVENOUS

## 2020-01-11 MED ORDER — HYDROMORPHONE HCL 1 MG/ML IJ SOLN
0.2500 mg | INTRAMUSCULAR | Status: DC | PRN
  Administered 2020-01-11 (×4): 0.5 mg via INTRAVENOUS

## 2020-01-11 MED ORDER — LACTATED RINGERS IV BOLUS
500.0000 mL | Freq: Once | INTRAVENOUS | Status: AC
  Administered 2020-01-11: 500 mL via INTRAVENOUS

## 2020-01-11 MED ORDER — FENTANYL CITRATE (PF) 100 MCG/2ML IJ SOLN
INTRAMUSCULAR | Status: DC | PRN
  Administered 2020-01-11: 50 ug via INTRAVENOUS

## 2020-01-11 MED ORDER — FENTANYL CITRATE (PF) 100 MCG/2ML IJ SOLN
INTRAMUSCULAR | Status: AC
  Filled 2020-01-11: qty 2

## 2020-01-11 MED ORDER — TRANEXAMIC ACID-NACL 1000-0.7 MG/100ML-% IV SOLN
1000.0000 mg | Freq: Once | INTRAVENOUS | Status: AC
  Administered 2020-01-11: 1000 mg via INTRAVENOUS

## 2020-01-11 MED ORDER — STERILE WATER FOR IRRIGATION IR SOLN
Status: DC | PRN
  Administered 2020-01-11: 2000 mL

## 2020-01-11 MED ORDER — POVIDONE-IODINE 10 % EX SWAB
2.0000 "application " | Freq: Once | CUTANEOUS | Status: AC
  Administered 2020-01-11: 2 via TOPICAL

## 2020-01-11 MED ORDER — OXYCODONE HCL 5 MG PO TABS
ORAL_TABLET | ORAL | Status: AC
  Filled 2020-01-11: qty 1

## 2020-01-11 MED ORDER — LIDOCAINE 2% (20 MG/ML) 5 ML SYRINGE
INTRAMUSCULAR | Status: AC
  Filled 2020-01-11: qty 5

## 2020-01-11 MED ORDER — OXYCODONE HCL 5 MG PO TABS: 5.0000 mg | ORAL_TABLET | ORAL | 0 refills | Status: AC | PRN

## 2020-01-11 MED ORDER — CHLORHEXIDINE GLUCONATE 4 % EX LIQD: 60.0000 mL | Freq: Once | CUTANEOUS | Status: DC

## 2020-01-11 MED ORDER — PROPOFOL 10 MG/ML IV BOLUS
INTRAVENOUS | Status: AC
  Filled 2020-01-11: qty 40

## 2020-01-11 MED ORDER — BUPIVACAINE LIPOSOME 1.3 % IJ SUSP
20.0000 mL | Freq: Once | INTRAMUSCULAR | Status: AC
  Administered 2020-01-11: 20 mL
  Filled 2020-01-11: qty 20

## 2020-01-11 MED ORDER — DEXAMETHASONE SODIUM PHOSPHATE 10 MG/ML IJ SOLN
INTRAMUSCULAR | Status: DC | PRN
  Administered 2020-01-11: 8 mg via INTRAVENOUS

## 2020-01-11 MED ORDER — OXYCODONE HCL 5 MG/5ML PO SOLN: 5.0000 mg | Freq: Once | ORAL | Status: AC | PRN

## 2020-01-11 MED ORDER — METHOCARBAMOL 500 MG PO TABS: 500.0000 mg | ORAL_TABLET | Freq: Four times a day (QID) | ORAL | Status: DC | PRN

## 2020-01-11 MED ORDER — METHOCARBAMOL 500 MG IVPB - SIMPLE MED
INTRAVENOUS | Status: AC
  Filled 2020-01-11: qty 50

## 2020-01-11 MED ORDER — LACTATED RINGERS IV BOLUS
250.0000 mL | Freq: Once | INTRAVENOUS | Status: AC
  Administered 2020-01-11: 250 mL via INTRAVENOUS

## 2020-01-11 MED ORDER — PROPOFOL 10 MG/ML IV BOLUS
INTRAVENOUS | Status: DC | PRN
  Administered 2020-01-11: 40 mg via INTRAVENOUS

## 2020-01-11 MED ORDER — SODIUM CHLORIDE 0.9 % IR SOLN
Status: DC | PRN
  Administered 2020-01-11: 1000 mL
  Administered 2020-01-11: 30 mL

## 2020-01-11 MED ORDER — ACETAMINOPHEN 500 MG PO TABS: 1000.0000 mg | ORAL_TABLET | Freq: Three times a day (TID) | ORAL | 0 refills | Status: AC

## 2020-01-11 MED ORDER — OXYCODONE HCL 5 MG PO TABS
5.0000 mg | ORAL_TABLET | Freq: Once | ORAL | Status: AC | PRN
  Administered 2020-01-11: 5 mg via ORAL

## 2020-01-11 MED ORDER — PHENYLEPHRINE 40 MCG/ML (10ML) SYRINGE FOR IV PUSH (FOR BLOOD PRESSURE SUPPORT)
PREFILLED_SYRINGE | INTRAVENOUS | Status: DC | PRN
  Administered 2020-01-11 (×8): 40 ug via INTRAVENOUS

## 2020-01-11 MED ORDER — BUPIVACAINE IN DEXTROSE 0.75-8.25 % IT SOLN
INTRATHECAL | Status: DC | PRN
  Administered 2020-01-11: 1.8 mL via INTRATHECAL

## 2020-01-11 MED ORDER — PROPOFOL 500 MG/50ML IV EMUL
INTRAVENOUS | Status: DC | PRN
  Administered 2020-01-11: 80 ug/kg/min via INTRAVENOUS

## 2020-01-11 MED ORDER — PHENYLEPHRINE 40 MCG/ML (10ML) SYRINGE FOR IV PUSH (FOR BLOOD PRESSURE SUPPORT)
PREFILLED_SYRINGE | INTRAVENOUS | Status: AC
  Filled 2020-01-11: qty 10

## 2020-01-11 SURGICAL SUPPLY — 57 items
BLADE HEX COATED 2.75 (ELECTRODE) ×2 IMPLANT
BLADE SAG 18X100X1.27 (BLADE) ×2 IMPLANT
BLADE SAGITTAL 25.0X1.37X90 (BLADE) ×2 IMPLANT
BLADE SURG 15 STRL LF DISP TIS (BLADE) ×1 IMPLANT
BLADE SURG 15 STRL SS (BLADE) ×2
BLADE SURG SZ10 CARB STEEL (BLADE) ×4 IMPLANT
BNDG CMPR MED 10X6 ELC LF (GAUZE/BANDAGES/DRESSINGS) ×1
BNDG CMPR MED 15X6 ELC VLCR LF (GAUZE/BANDAGES/DRESSINGS) ×1
BNDG ELASTIC 6X10 VLCR STRL LF (GAUZE/BANDAGES/DRESSINGS) ×2 IMPLANT
BNDG ELASTIC 6X15 VLCR STRL LF (GAUZE/BANDAGES/DRESSINGS) ×1 IMPLANT
BOWL SMART MIX CTS (DISPOSABLE) IMPLANT
BSPLAT TIB 5 KN TRITANIUM (Knees) ×1 IMPLANT
CEMENT BONE SIMPLEX SPEEDSET (Cement) ×2 IMPLANT
CLSR STERI-STRIP ANTIMIC 1/2X4 (GAUZE/BANDAGES/DRESSINGS) ×2 IMPLANT
COVER SURGICAL LIGHT HANDLE (MISCELLANEOUS) ×2 IMPLANT
COVER WAND RF STERILE (DRAPES) IMPLANT
CUFF TOURN SGL QUICK 34 (TOURNIQUET CUFF) ×2
CUFF TRNQT CYL 34X4.125X (TOURNIQUET CUFF) ×1 IMPLANT
DECANTER SPIKE VIAL GLASS SM (MISCELLANEOUS) ×2 IMPLANT
DRAPE U-SHAPE 47X51 STRL (DRAPES) ×2 IMPLANT
DRSG MEPILEX BORDER 4X12 (GAUZE/BANDAGES/DRESSINGS) ×2 IMPLANT
DRSG MEPILEX BORDER 4X8 (GAUZE/BANDAGES/DRESSINGS) ×1 IMPLANT
DURAPREP 26ML APPLICATOR (WOUND CARE) ×4 IMPLANT
FEMORAL POSTERIOR SZ4 RT (Femur) IMPLANT
GLOVE BIO SURGEON STRL SZ7.5 (GLOVE) ×4 IMPLANT
GLOVE BIOGEL PI IND STRL 8 (GLOVE) ×2 IMPLANT
GLOVE BIOGEL PI INDICATOR 8 (GLOVE) ×2
GOWN STRL REUS W/ TWL LRG LVL3 (GOWN DISPOSABLE) ×2 IMPLANT
GOWN STRL REUS W/TWL LRG LVL3 (GOWN DISPOSABLE) ×4
HANDPIECE INTERPULSE COAX TIP (DISPOSABLE) ×2
HOLDER FOLEY CATH W/STRAP (MISCELLANEOUS) IMPLANT
IMMOBILIZER KNEE 20 (SOFTGOODS) ×2
IMMOBILIZER KNEE 20 THIGH 36 (SOFTGOODS) IMPLANT
IMMOBILIZER KNEE 22 UNIV (SOFTGOODS) ×2 IMPLANT
INSERT TIB BEARING PS 5 9 (Miscellaneous) ×1 IMPLANT
KIT TURNOVER KIT A (KITS) IMPLANT
KNEE PATELLA ASYMMETRIC 10X32 (Knees) ×1 IMPLANT
KNEE TIBIAL COMPONENT SZ5 (Knees) ×1 IMPLANT
MANIFOLD NEPTUNE II (INSTRUMENTS) ×2 IMPLANT
NS IRRIG 1000ML POUR BTL (IV SOLUTION) ×2 IMPLANT
PACK ICE MAXI GEL EZY WRAP (MISCELLANEOUS) ×2 IMPLANT
PACK TOTAL KNEE CUSTOM (KITS) ×2 IMPLANT
PENCIL SMOKE EVACUATOR (MISCELLANEOUS) IMPLANT
PIN FLUTED HEDLESS FIX 3.5X1/8 (PIN) ×1 IMPLANT
POSTERIOR FEMORAL SZ4 RT (Femur) ×2 IMPLANT
PROTECTOR NERVE ULNAR (MISCELLANEOUS) ×2 IMPLANT
SET HNDPC FAN SPRY TIP SCT (DISPOSABLE) ×1 IMPLANT
SUT MNCRL AB 4-0 PS2 18 (SUTURE) ×2 IMPLANT
SUT VIC AB 0 CT1 36 (SUTURE) ×2 IMPLANT
SUT VIC AB 1 CT1 36 (SUTURE) ×4 IMPLANT
SUT VIC AB 2-0 CT1 27 (SUTURE) ×2
SUT VIC AB 2-0 CT1 TAPERPNT 27 (SUTURE) ×1 IMPLANT
TAPE STRIPS DRAPE STRL (GAUZE/BANDAGES/DRESSINGS) ×1 IMPLANT
TRAY FOLEY MTR SLVR 14FR STAT (SET/KITS/TRAYS/PACK) IMPLANT
TRAY FOLEY MTR SLVR 16FR STAT (SET/KITS/TRAYS/PACK) IMPLANT
WRAP KNEE MAXI GEL POST OP (GAUZE/BANDAGES/DRESSINGS) ×1 IMPLANT
YANKAUER SUCT BULB TIP 10FT TU (MISCELLANEOUS) ×2 IMPLANT

## 2020-01-11 NOTE — Anesthesia Procedure Notes (Signed)
Spinal  Patient location during procedure: OR Start time: 01/11/2020 7:42 AM Staffing Performed: resident/CRNA  Anesthesiologist: Josephine Igo, MD Resident/CRNA: Eben Burow, CRNA Preanesthetic Checklist Completed: patient identified, IV checked, site marked, risks and benefits discussed, surgical consent, monitors and equipment checked, pre-op evaluation and timeout performed Spinal Block Patient position: sitting Prep: DuraPrep and site prepped and draped Patient monitoring: heart rate, cardiac monitor, continuous pulse ox and blood pressure Approach: midline Location: L3-4 Injection technique: single-shot Needle Needle type: Pencan  Needle gauge: 24 G Needle length: 9 cm Assessment Sensory level: T4 Additional Notes Pt placed in sitting position, spinal kit expiration date checked and verified, + CSF, - heme, pt tolerated well. Dr Royce Macadamia present and supervising throughout SAB placement.

## 2020-01-11 NOTE — Anesthesia Procedure Notes (Signed)
Procedure Name: MAC Date/Time: 01/11/2020 7:39 AM Performed by: Eben Burow, CRNA Pre-anesthesia Checklist: Patient identified, Emergency Drugs available, Suction available, Patient being monitored and Timeout performed Oxygen Delivery Method: Simple face mask Dental Injury: Teeth and Oropharynx as per pre-operative assessment

## 2020-01-11 NOTE — Interval H&P Note (Signed)
I participated in the care of this patient and agree with the above history, physical and evaluation. I performed a review of the history and a physical exam as detailed   Deo Mehringer Daniel Aryaman Haliburton MD  

## 2020-01-11 NOTE — Anesthesia Procedure Notes (Addendum)
Anesthesia Regional Block: Adductor canal block   Pre-Anesthetic Checklist: ,, timeout performed, Correct Patient, Correct Site, Correct Laterality, Correct Procedure, Correct Position, site marked, Risks and benefits discussed,  Surgical consent,  Pre-op evaluation,  At surgeon's request and post-op pain management  Laterality: Right  Prep: chloraprep       Needles:  Injection technique: Single-shot  Needle Type: Echogenic Stimulator Needle     Needle Length: 9cm  Needle Gauge: 21   Needle insertion depth: 6 cm   Additional Needles:   Procedures:,,,, ultrasound used (permanent image in chart),,,,  Narrative:  Start time: 01/11/2020 7:05 AM End time: 01/11/2020 7:10 AM Injection made incrementally with aspirations every 5 mL.  Performed by: Personally  Anesthesiologist: Mal Amabile, MD  Additional Notes: Timeout performed. Patient sedated. Relevant anatomy ID'd using [place]. Incremental 2-96ml injection of LA with frequent aspiration. Patient tolerated procedure well.        Right Adductor Canal Block

## 2020-01-11 NOTE — Evaluation (Signed)
Physical Therapy Evaluation Patient Details Name: [name] [name] MRN: [id number] DOB: 01/27/1956 Today's Date: 01/11/2020   History of Present Illness  s/p R TKA  Clinical Impression  Patient evaluated by Physical Therapy with no further acute PT needs identified. All education has been completed and the patient has no further questions.  Pt doing well today.  Reviewed gait, safety, stairs, TKA HEP. Pt  Ok to d/c with family assist at home. Pt is unsure about post acute PT, PA's note states OPPT.   See below for any follow-up Physical Therapy or equipment needs. PT is signing off. Thank you for this referral.     Follow Up Recommendations Follow surgeon's recommendation for DC plan and follow-up therapies    Equipment Recommendations  None recommended by PT    Recommendations for Other Services       Precautions / Restrictions Precautions Precautions: Fall;Knee Required Braces or Orthoses: Knee Immobilizer - Right Restrictions Other Position/Activity Restrictions: WBAT      Mobility  Bed Mobility Overal bed mobility: Needs Assistance Bed Mobility: Supine to Sit     Supine to sit: Min guard     General bed mobility comments: for safety  Transfers Overall transfer level: Needs assistance Equipment used: Rolling walker (2 wheeled) Transfers: Sit to/from Stand Sit to Stand: Min guard         General transfer comment: cues for hand placement and RLE position  Ambulation/Gait Ambulation/Gait assistance: Min guard Gait Distance (Feet): 100 Feet Assistive device: Rolling walker (2 wheeled) Gait Pattern/deviations: Step-to pattern     General Gait Details: cues for sequence and RW safety  Stairs Stairs: Yes Stairs assistance: Min guard;Min assist Stair Management: Step to pattern;Forwards;With walker Number of Stairs: 2 General stair comments: cues for sequence  Wheelchair Mobility    Modified Rankin (Stroke Patients Only)       Balance                                             Pertinent Vitals/Pain Pain Assessment: 0-10 Pain Score: 2  Pain Location: right knee Pain Descriptors / Indicators: Discomfort Pain Intervention(s): Limited activity within patient's tolerance;Monitored during session;Other (comment)(adductor block)    Home Living Family/patient expects to be discharged to:: Private residence Living Arrangements: Children Available Help at Discharge: Family Type of Home: House Home Access: Stairs to enter   Technical brewer of Steps: 1 Home Layout: One level Home Equipment: Environmental consultant - 2 wheels      Prior Function Level of Independence: Independent               Hand Dominance        Extremity/Trunk Assessment   Upper Extremity Assessment Upper Extremity Assessment: Overall WFL for tasks assessed    Lower Extremity Assessment Lower Extremity Assessment: RLE deficits/detail RLE Deficits / Details: ankle WFL; knee extension and hip flexion 2+/5; knee AAROM ~ 6 to 65 degrees flexion AAROM       Communication   Communication: No difficulties  Cognition Arousal/Alertness: Awake/alert Behavior During Therapy: WFL for tasks assessed/performed Overall Cognitive Status: Within Functional Limits for tasks assessed                                        General Comments      Exercises  Total Joint Exercises Ankle Circles/Pumps: AROM;Both;10 reps Quad Sets: 10 reps;AROM Heel Slides: AAROM;10 reps Hip ABduction/ADduction: 10 reps;AROM;AAROM Straight Leg Raises: AAROM;Right;10 reps   Assessment/Plan    PT Assessment All further PT needs can be met in the next venue of care  PT Problem List         PT Treatment Interventions      PT Goals (Current goals can be found in the Care Plan section)  Acute Rehab PT Goals Patient Stated Goal: home today PT Goal Formulation: All assessment and education complete, DC therapy    Frequency     Barriers to  discharge        Co-evaluation               AM-PAC PT "6 Clicks" Mobility  Outcome Measure Help needed turning from your back to your side while in a flat bed without using bedrails?: A Little Help needed moving from lying on your back to sitting on the side of a flat bed without using bedrails?: A Little Help needed moving to and from a bed to a chair (including a wheelchair)?: A Little Help needed standing up from a chair using your arms (e.g., wheelchair or bedside chair)?: A Little Help needed to walk in hospital room?: A Little Help needed climbing 3-5 steps with a railing? : A Little 6 Click Score: 18    End of Session Equipment Utilized During Treatment: Gait belt Activity Tolerance: Patient tolerated treatment well Patient left: with call bell/phone within reach;in chair   PT Visit Diagnosis: Difficulty in walking, not elsewhere classified (R26.2)    Time: 1610-9604 PT Time Calculation (min) (ACUTE ONLY): 40 min   Charges:   PT Evaluation $PT Eval Low Complexity: 1 Low PT Treatments $Gait Training: 8-22 mins $Therapeutic Exercise: 8-22 mins        Baxter Flattery, PT   Acute Rehab Dept [facility]): [phone]   01/11/2020   [facility] 01/11/2020, 3:06 PM

## 2020-01-11 NOTE — Transfer of Care (Signed)
Immediate Anesthesia Transfer of Care Note  Patient: Dorinda Hill Weis  Procedure(s) Performed: TOTAL KNEE ARTHROPLASTY (Right Knee)  Patient Location: PACU  Anesthesia Type:Spinal  Level of Consciousness: awake, drowsy and patient cooperative  Airway & Oxygen Therapy: Patient Spontanous Breathing and Patient connected to face mask oxygen  Post-op Assessment: Report given to RN and Post -op Vital signs reviewed and stable  Post vital signs: Reviewed and stable  Last Vitals:  Vitals Value Taken Time  BP 138/80 01/11/20 0944  Temp    Pulse 78 01/11/20 0945  Resp 12 01/11/20 0945  SpO2 100 % 01/11/20 0945  Vitals shown include unvalidated device data.  Last Pain:  Vitals:   01/11/20 0634  TempSrc: Oral  PainSc:       Patients Stated Pain Goal: 4 (01/11/20 0550)  Complications: No apparent anesthesia complications

## 2020-01-11 NOTE — Discharge Instructions (Signed)

## 2020-01-11 NOTE — Op Note (Signed)
DATE OF SURGERY:  01/11/2020 TIME: 8:59 AM  PATIENT NAME:  [name] [name]   AGE: 64 y.o.    PRE-OPERATIVE DIAGNOSIS:  OA RIGHT KNEE  POST-OPERATIVE DIAGNOSIS:  Same  PROCEDURE:  Procedure(s): TOTAL KNEE ARTHROPLASTY   SURGEON:  Sheral Apley, MD   ASSISTANT:  Karlton Lemon, PA-C, he was present and scrubbed throughout the case, critical for completion in a timely fashion, and for retraction, instrumentation, and closure.    OPERATIVE IMPLANTS: Stryker Triathlon Posterior Stabilized. Press fit knee  Femur size 4, Tibia size 5, Patella size 32 3-peg oval button, with a 9 mm polyethylene insert.   PREOPERATIVE INDICATIONS:  [name] [name] is a 64 y.o. year old male with end stage bone on bone degenerative arthritis of the knee who failed conservative treatment, including injections, antiinflammatories, activity modification, and assistive devices, and had significant impairment of their activities of daily living, and elected for Total Knee Arthroplasty.   The risks, benefits, and alternatives were discussed at length including but not limited to the risks of infection, bleeding, nerve injury, stiffness, blood clots, the need for revision surgery, cardiopulmonary complications, among others, and they were willing to proceed.   OPERATIVE DESCRIPTION:  The patient was brought to the operative room and placed in a supine position.  General anesthesia was administered.  IV antibiotics were given.  The lower extremity was prepped and draped in the usual sterile fashion.  Time out was performed.  The leg was elevated and exsanguinated and the tourniquet was inflated.  Anterior approach was performed.  The patella was everted and osteophytes were removed.  The anterior horn of the medial and lateral meniscus was removed.   The distal femur was opened with the drill and the intramedullary distal femoral cutting jig was utilized, set at 5 degrees resecting 8 mm off the distal femur.   Care was taken to protect the collateral ligaments.  The distal femoral sizing jig was applied, taking care to avoid notching.  Then the 4-in-1 cutting jig was applied and the anterior and posterior femur was cut, along with the chamfer cuts.  All posterior osteophytes were removed.  The flexion gap was then measured and was symmetric with the extension gap.  Then the extramedullary tibial cutting jig was utilized making the appropriate cut using the anterior tibial crest as a reference building in appropriate posterior slope.  Care was taken during the cut to protect the medial and collateral ligaments.  The proximal tibia was removed along with the posterior horns of the menisci.  The PCL was sacrificed.    The extensor gap was measured and was approximately 27mm.    I completed the distal femoral preparation using the appropriate jig to prepare the box.  The patella was then measured, and cut with the saw.    The proximal tibia sized and prepared accordingly with the reamer and the punch, and then all components were trialed with the above sized poly insert.  The knee was found to have excellent balance and full motion.    The above named components were then impacted into place and Poly tibial piece and patella were inserted.  I was very happy with his stability and ROM  I performed a periarticular injection with marcaine and toradol  The knee was easily taken through a range of motion and the patella tracked well and the knee irrigated copiously and the parapatellar and subcutaneous tissue closed with vicryl, and monocryl with steri strips for the skin.  The incision  was dressed with sterile gauze and the tourniquet released and the patient was awakened and returned to the PACU in stable and satisfactory condition.  There were no complications.  Total tourniquet time was roughly 75 minutes.   POSTOPERATIVE PLAN: post op Abx, DVT px: SCD's, TED's, Early ambulation and chemical  px

## 2020-01-11 NOTE — Progress Notes (Signed)
    Subjective: The patient underwent successful Right Total Knee Arthroplasty this morning.  He has recovered quite well and has mobilized more quickly than expected.  His goal and desire is to discharge to home same day of surgery.  His admission status was therefore changed to ambulatory.   Objective:   VITALS:   Vitals:   01/11/20 1100 01/11/20 1106 01/11/20 1206 01/11/20 1300  BP: 137/82 (!) 146/80 135/90 132/79  Pulse: 79 72 83 62  Resp: 17 16 20 18   Temp: (!) 97.4 F (36.3 C) (!) 97.5 F (36.4 C)    TempSrc:      SpO2: 97% 96% 97% 94%  Weight:      Height:       CBC Latest Ref Rng & Units 12/31/2019 01/18/2019 03/17/2018  WBC 4.0 - 10.5 K/uL 7.0 6.1 7.6  Hemoglobin 13.0 - 17.0 g/dL 12.2(L) 13.3 12.4(L)  Hematocrit 39.0 - 52.0 % 37.3(L) 39.0 35.9(L)  Platelets 150 - 400 K/uL 224 245 227   BMP Latest Ref Rng & Units 12/31/2019 01/18/2019 03/17/2018  Glucose 70 - 99 mg/dL 90 05/17/2018) 91  BUN 8 - 23 mg/dL 18 20 13   Creatinine 0.61 - 1.24 mg/dL 202(R 4.27  BUN/Creat Ratio 6 - 22 (calc) - NOT APPLICABLE NOT APPLICABLE  Sodium 135 - 145 mmol/L 142 139 141  Potassium 3.5 - 5.1 mmol/L 4.6 4.3 4.2  Chloride 98 - 111 mmol/L 108 104 105  CO2 22 - 32 mmol/L 29 27 28   Calcium 8.9 - 10.3 mg/dL 9.1 9.4 9.1   Intake/Output      03/29 0701 - 03/30 0700 03/30 0701 - 03/31 0700   P.O.  236   I.V. (mL/kg)  1783.4 (23.1)   IV Piggyback  1000   Total Intake(mL/kg)  3019.4 (39.1)   Urine (mL/kg/hr)  1950 (3.7)   Blood  50   Total Output  2000   Net  +1019.4         Assessment / Plan: Day of Surgery  S/P Procedure(s) (LRB): TOTAL KNEE ARTHROPLASTY (Right) by Dr. 4/30. Murphy on 01/11/20  Principal Problem:   Primary osteoarthritis of right knee   Primary osteoarthritis, status post right total knee arthroplasty  Same Day Discharge  Advance diet Up with therapy Incentive Spirometry Elevate and Apply ice prn CPM, bone foam at home Discharge home today (POD0)  Weight  Bearing: Weight Bearing as Tolerated (WBAT)  Dressings: Maintain Mepilex.  Please ensure thigh high TED hose are applied to operative leg prior to discharge. VTE prophylaxis: Aspirin, SCDs, ambulation Dispo: Home today   4/31 III, PA-C 01/11/2020, 1:48 PM

## 2020-01-11 NOTE — Anesthesia Postprocedure Evaluation (Signed)
Anesthesia Post Note  Patient: [name] [name]  Procedure(s) Performed: TOTAL KNEE ARTHROPLASTY (Right Knee)     Patient location during evaluation: PACU Anesthesia Type: Spinal Level of consciousness: oriented and awake and alert Pain management: pain level controlled Vital Signs Assessment: post-procedure vital signs reviewed and stable Respiratory status: spontaneous breathing, respiratory function stable and nonlabored ventilation Cardiovascular status: blood pressure returned to baseline and stable Postop Assessment: no headache, no backache, no apparent nausea or vomiting, spinal receding and patient able to bend at knees Anesthetic complications: no    Last Vitals:  Vitals:   01/11/20 0944 01/11/20 0945  BP: 138/80 127/86  Pulse: 79 78  Resp: 10 12  Temp: (!) 36.3 C   SpO2: 100% 100%    Last Pain:  Vitals:   01/11/20 1015  TempSrc:   PainSc: 6                  Courage Biglow A.

## 2020-01-12 ENCOUNTER — Encounter: Payer: Self-pay | Admitting: *Deleted

## 2020-01-12 ENCOUNTER — Other Ambulatory Visit: Payer: Self-pay | Admitting: Adult Health

## 2020-01-12 DIAGNOSIS — Z96651 Presence of right artificial knee joint: Secondary | ICD-10-CM | POA: Diagnosis not present

## 2020-01-12 DIAGNOSIS — M1711 Unilateral primary osteoarthritis, right knee: Secondary | ICD-10-CM | POA: Diagnosis not present

## 2020-01-13 DIAGNOSIS — R262 Difficulty in walking, not elsewhere classified: Secondary | ICD-10-CM | POA: Diagnosis not present

## 2020-01-13 DIAGNOSIS — M6281 Muscle weakness (generalized): Secondary | ICD-10-CM | POA: Diagnosis not present

## 2020-01-13 DIAGNOSIS — M25461 Effusion, right knee: Secondary | ICD-10-CM | POA: Diagnosis not present

## 2020-01-13 DIAGNOSIS — M25661 Stiffness of right knee, not elsewhere classified: Secondary | ICD-10-CM | POA: Diagnosis not present

## 2020-01-17 DIAGNOSIS — M25661 Stiffness of right knee, not elsewhere classified: Secondary | ICD-10-CM | POA: Diagnosis not present

## 2020-01-17 DIAGNOSIS — M6281 Muscle weakness (generalized): Secondary | ICD-10-CM | POA: Diagnosis not present

## 2020-01-17 DIAGNOSIS — M25461 Effusion, right knee: Secondary | ICD-10-CM | POA: Diagnosis not present

## 2020-01-17 DIAGNOSIS — R262 Difficulty in walking, not elsewhere classified: Secondary | ICD-10-CM | POA: Diagnosis not present

## 2020-01-19 DIAGNOSIS — M25661 Stiffness of right knee, not elsewhere classified: Secondary | ICD-10-CM | POA: Diagnosis not present

## 2020-01-19 DIAGNOSIS — R262 Difficulty in walking, not elsewhere classified: Secondary | ICD-10-CM | POA: Diagnosis not present

## 2020-01-19 DIAGNOSIS — M25461 Effusion, right knee: Secondary | ICD-10-CM | POA: Diagnosis not present

## 2020-01-19 DIAGNOSIS — M6281 Muscle weakness (generalized): Secondary | ICD-10-CM | POA: Diagnosis not present

## 2020-01-24 DIAGNOSIS — R262 Difficulty in walking, not elsewhere classified: Secondary | ICD-10-CM | POA: Diagnosis not present

## 2020-01-24 DIAGNOSIS — M25461 Effusion, right knee: Secondary | ICD-10-CM | POA: Diagnosis not present

## 2020-01-24 DIAGNOSIS — M25661 Stiffness of right knee, not elsewhere classified: Secondary | ICD-10-CM | POA: Diagnosis not present

## 2020-01-24 DIAGNOSIS — M6281 Muscle weakness (generalized): Secondary | ICD-10-CM | POA: Diagnosis not present

## 2020-01-26 DIAGNOSIS — M25461 Effusion, right knee: Secondary | ICD-10-CM | POA: Diagnosis not present

## 2020-01-31 DIAGNOSIS — M25661 Stiffness of right knee, not elsewhere classified: Secondary | ICD-10-CM | POA: Diagnosis not present

## 2020-01-31 DIAGNOSIS — R262 Difficulty in walking, not elsewhere classified: Secondary | ICD-10-CM | POA: Diagnosis not present

## 2020-01-31 DIAGNOSIS — M6281 Muscle weakness (generalized): Secondary | ICD-10-CM | POA: Diagnosis not present

## 2020-01-31 DIAGNOSIS — M25461 Effusion, right knee: Secondary | ICD-10-CM | POA: Diagnosis not present

## 2020-02-02 DIAGNOSIS — M6281 Muscle weakness (generalized): Secondary | ICD-10-CM | POA: Diagnosis not present

## 2020-02-02 DIAGNOSIS — R262 Difficulty in walking, not elsewhere classified: Secondary | ICD-10-CM | POA: Diagnosis not present

## 2020-02-02 DIAGNOSIS — M25461 Effusion, right knee: Secondary | ICD-10-CM | POA: Diagnosis not present

## 2020-02-02 DIAGNOSIS — M25661 Stiffness of right knee, not elsewhere classified: Secondary | ICD-10-CM | POA: Diagnosis not present

## 2020-02-07 DIAGNOSIS — R262 Difficulty in walking, not elsewhere classified: Secondary | ICD-10-CM | POA: Diagnosis not present

## 2020-02-07 DIAGNOSIS — M6281 Muscle weakness (generalized): Secondary | ICD-10-CM | POA: Diagnosis not present

## 2020-02-07 DIAGNOSIS — M25661 Stiffness of right knee, not elsewhere classified: Secondary | ICD-10-CM | POA: Diagnosis not present

## 2020-02-07 DIAGNOSIS — M25461 Effusion, right knee: Secondary | ICD-10-CM | POA: Diagnosis not present

## 2020-02-11 DIAGNOSIS — M25561 Pain in right knee: Secondary | ICD-10-CM | POA: Diagnosis not present

## 2020-02-11 DIAGNOSIS — M25461 Effusion, right knee: Secondary | ICD-10-CM | POA: Diagnosis not present

## 2020-02-14 DIAGNOSIS — M6281 Muscle weakness (generalized): Secondary | ICD-10-CM | POA: Diagnosis not present

## 2020-02-14 DIAGNOSIS — M25461 Effusion, right knee: Secondary | ICD-10-CM | POA: Diagnosis not present

## 2020-02-14 DIAGNOSIS — R262 Difficulty in walking, not elsewhere classified: Secondary | ICD-10-CM | POA: Diagnosis not present

## 2020-02-14 DIAGNOSIS — M25661 Stiffness of right knee, not elsewhere classified: Secondary | ICD-10-CM | POA: Diagnosis not present

## 2020-02-16 ENCOUNTER — Encounter: Payer: Self-pay | Admitting: Internal Medicine

## 2020-02-16 NOTE — Patient Instructions (Addendum)

## 2020-02-16 NOTE — Progress Notes (Signed)
Annual  Screening/Preventative Visit  & Comprehensive Evaluation & Examination     This very nice 64 y.o. DW presents for a Screening /Preventative Visit & comprehensive evaluation and management of multiple medical co-morbidities.  Patient has been followed for HTN, HLD, T2_NIDDM  Prediabetes and Vitamin D Deficiency. Patient has hx/o OSA. On 01/11/2020, patient underwent Rt TKA      HTN predates since  2004. Patient's BP has been controlled at home.  Today's BP is at goal - 138/86. Patient denies any cardiac symptoms as chest pain, palpitations, shortness of breath, dizziness or ankle swelling.     Patient's hyperlipidemia is controlled with diet and Simvastatin. Patient denies myalgias or other medication SE's. Last lipids were near goal:  Lab Results  Component Value Date   CHOL 162 01/18/2019   HDL 43 01/18/2019   LDLCALC 102 (H) 01/18/2019   TRIG 82 01/18/2019   CHOLHDL 3.8 01/18/2019       Patient has hx/o prediabetes (A1c 6.0% / 2015) and patient denies reactive hypoglycemic symptoms, visual blurring, diabetic polys or paresthesias. Last A1c was not at goal:  Lab Results  Component Value Date   HGBA1C 6.1 (H) 12/31/2019        Finally, patient has history of Vitamin D Deficiency ("35" / 2009)  and last vitamin D was not at goal:  Lab Results  Component Value Date   VD25OH 26 01/18/2019    Current Outpatient Medications on File Prior to Visit  Medication Sig  . Cholecalciferol 5000 units capsule Take 1 capsule (5,000 Units total) by mouth daily.  Marland Kitchen omeprazole (PRILOSEC) 20 MG capsule Take 1 capsule (20 mg total) by mouth daily. 30 days for gastroprotection while taking NSAIDs.  Marland Kitchen simvastatin (ZOCOR) 40 MG tablet TAKE 1 TABLET BY MOUTH AT BEDTIME FOR CHOLESTEROL  . valsartan (DIOVAN) 80 MG tablet Take 1 tablet Daily for BP (Patient taking differently: Take 80 mg by mouth daily. )  . aspirin EC 81 MG tablet Take 1 tablet (81 mg total) by mouth 2 (two) times daily.  For DVT prophylaxis for 30 days after surgery. (Patient not taking: Reported on 02/17/2020)   No current facility-administered medications on file prior to visit.   No Known Allergies   Past Medical History:  Diagnosis Date  . Heart murmur   . Hyperlipidemia   . Hypertension   . Shingles   . Vitamin D deficiency    Health Maintenance  Topic Date Due  . Hepatitis C Screening  Never done  . COVID-19 Vaccine (1) Never done  . TETANUS/TDAP  10/19/2018  . INFLUENZA VACCINE  05/14/2020  . COLONOSCOPY  12/13/2022  . PNA vac Low Risk Adult  Completed   Immunization History  Administered Date(s) Administered  . Influenza Inj Mdck Quad With Preservative 09/02/2017  . PPD Test 09/02/2017  . Pneumococcal Conjugate-13 01/18/2019  . Pneumococcal Polysaccharide-23 02/17/2020  . Tdap 10/19/2008   Last Colon - due 12/13/2022 - Dr Earlean Shawl  Past Surgical History:  Procedure Laterality Date  . ARTHROSCOPY WITH ANTERIOR CRUCIATE LIGAMENT (ACL) REPAIR WITH ANTERIOR TIBILIAS GRAFT Right   . INGUINAL HERNIA REPAIR Bilateral 09/28/2015   Procedure: LAPAROSCOPIC BILATERAL INGUINAL HERNIA REPAIR;  Surgeon: Greer Pickerel, MD;  Location: [facility];  Service: General;  Laterality: Bilateral;  . INSERTION OF MESH Bilateral 09/28/2015   Procedure: INSERTION OF MESH;  Surgeon: Greer Pickerel, MD;  Location: [facility];  Service: General;  Laterality: Bilateral;  . SHOULDER ARTHROSCOPY    . TOTAL KNEE  ARTHROPLASTY Right 01/11/2020   Procedure: TOTAL KNEE ARTHROPLASTY;  Surgeon: Sheral Apley, MD;  Location: [facility];  Service: Orthopedics;  Laterality: Right;   Family History  Problem Relation Age of Onset  . Cancer Mother        colon, lung  . Heart disease Father   . Hyperlipidemia Father   . Heart attack Father    Social History   Socioeconomic History  . Marital status: Divorced  . Number of children: 3 children   Occupational History  . 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  Tobacco Use    . Smoking status: Never Smoker  . Smokeless tobacco: Never Used  Substance and Sexual Activity  . Alcohol use: Yes    Comment: OCCASIONAL BEER   . Drug use: No  . Sexual activity: Not on file     ROS Constitutional: Denies fever, chills, weight loss/gain, headaches, insomnia,  night sweats or change in appetite. Does c/o fatigue. Eyes: Denies redness, blurred vision, diplopia, discharge, itchy or watery eyes.  ENT: Denies discharge, congestion, post nasal drip, epistaxis, sore throat, earache, hearing loss, dental pain, Tinnitus, Vertigo, Sinus pain or snoring.  Cardio: Denies chest pain, palpitations, irregular heartbeat, syncope, dyspnea, diaphoresis, orthopnea, PND, claudication or edema Respiratory: denies cough, dyspnea, DOE, pleurisy, hoarseness, laryngitis or wheezing.  Gastrointestinal: Denies dysphagia, heartburn, reflux, water brash, pain, cramps, nausea, vomiting, bloating, diarrhea, constipation, hematemesis, melena, hematochezia, jaundice or hemorrhoids Genitourinary: Denies dysuria, frequency, urgency, nocturia, hesitancy, discharge, hematuria or flank pain Musculoskeletal: Denies arthralgia, myalgia, stiffness, Jt. Swelling, pain, limp or strain/sprain. Denies Falls. Skin: Denies puritis, rash, hives, warts, acne, eczema or change in skin lesion Neuro: No weakness, tremor, incoordination, spasms, paresthesia or pain Psychiatric: Denies confusion, memory loss or sensory loss. Denies Depression. Endocrine: Denies change in weight, skin, hair change, nocturia, and paresthesia, diabetic polys, visual blurring or hyper / hypo glycemic episodes.  Heme/Lymph: No excessive bleeding, bruising or enlarged lymph nodes.  Physical Exam  BP 138/86   Pulse 80   Temp (!) 96.5 F (35.8 C)   Resp 16   Ht 5\' 9"  (1.753 m)   Wt 165 lb 3.2 oz (74.9 kg)   BMI 24.40 kg/m   General Appearance: Well nourished and well groomed and in no apparent distress.  Eyes: PERRLA, EOMs, conjunctiva  no swelling or erythema, normal fundi and vessels. Sinuses: No frontal/maxillary tenderness ENT/Mouth: EACs patent / TMs  nl. Nares clear without erythema, swelling, mucoid exudates. Oral hygiene is good. No erythema, swelling, or exudate. Tongue normal, non-obstructing. Tonsils not swollen or erythematous. Hearing normal.  Neck: Supple, thyroid not palpable. No bruits, nodes or JVD. Respiratory: Respiratory effort normal.  BS equal and clear bilateral without rales, rhonci, wheezing or stridor. Cardio: Heart sounds are normal with regular rate and rhythm and no murmurs, rubs or gallops. Peripheral pulses are normal and equal bilaterally without edema. No aortic or femoral bruits. Chest: symmetric with normal excursions and percussion.  Abdomen: Soft, with Nl bowel sounds. Nontender, no guarding, rebound, hernias, masses, or organomegaly.  Lymphatics: Non tender without lymphadenopathy.  Musculoskeletal: Full ROM all peripheral extremities, joint stability, 5/5 strength, and normal gait. Skin: Warm and dry without rashes, lesions, cyanosis, clubbing or  ecchymosis.  Neuro: Cranial nerves intact, reflexes equal bilaterally. Normal muscle tone, no cerebellar symptoms. Sensation intact.  Pysch: Alert and oriented X 3 with normal affect, insight and judgment appropriate.   Assessment and Plan  1. Annual Preventative/Screening Exam   2. Essential hypertension, benign  -  EKG 12-Lead - [place], RETROPERITNL ABD,  LTD - Urinalysis, Routine w reflex microscopic - Microalbumin / creatinine urine ratio - CBC with Differential/Platelet - COMPLETE METABOLIC PANEL WITH GFR - Magnesium - TSH  3. Hyperlipidemia, mixed  - EKG 12-Lead - [place], RETROPERITNL ABD,  LTD - Lipid panel - TSH  4. Abnormal glucose  - EKG 12-Lead - [place], RETROPERITNL ABD,  LTD - Hemoglobin A1c - Insulin, random  5. Vitamin D deficiency  - VITAMIN D 25 Hydroxy  6. Prediabetes  - EKG 12-Lead - [place], RETROPERITNL ABD,   LTD - Hemoglobin A1c - Insulin, random  7. Screening for colorectal cancer  - POC Hemoccult Bld/Stl  8. OSA (obstructive sleep apnea)   9. Screening for ischemic heart disease  - EKG 12-Lead  10. FHx: heart disease  - EKG 12-Lead - [place], RETROPERITNL ABD,  LTD  11. Screening for AAA (aortic abdominal aneurysm)  - [place], RETROPERITNL ABD,  LTD  12. BPH with obstruction/lower urinary tract symptoms  - PSA  13. Prostate cancer screening  - PSA  14. Medication management  - Urinalysis, Routine w reflex microscopic - Microalbumin / creatinine urine ratio - CBC with Differential/Platelet - COMPLETE METABOLIC PANEL WITH GFR - Magnesium - Lipid panel - TSH - Hemoglobin A1c - Insulin, random - VITAMIN D 25 Hydroxy        Patient was counseled in prudent diet, weight control to achieve/maintain BMI less than 25, BP monitoring, regular exercise and medications as discussed.  Discussed med effects and SE's. Routine screening labs and tests as requested with regular follow-up as recommended. Over 40 minutes of exam, counseling, chart review and high complex critical decision making was performed   Marinus Maw, MD

## 2020-02-17 ENCOUNTER — Ambulatory Visit (INDEPENDENT_AMBULATORY_CARE_PROVIDER_SITE_OTHER): Payer: BC Managed Care – PPO | Admitting: Internal Medicine

## 2020-02-17 ENCOUNTER — Encounter: Payer: Self-pay | Admitting: Internal Medicine

## 2020-02-17 ENCOUNTER — Other Ambulatory Visit: Payer: Self-pay

## 2020-02-17 VITALS — BP 138/86 | HR 80 | Temp 96.5°F | Resp 16 | Ht 69.0 in | Wt 165.2 lb

## 2020-02-17 DIAGNOSIS — Z1389 Encounter for screening for other disorder: Secondary | ICD-10-CM | POA: Diagnosis not present

## 2020-02-17 DIAGNOSIS — Z125 Encounter for screening for malignant neoplasm of prostate: Secondary | ICD-10-CM

## 2020-02-17 DIAGNOSIS — Z23 Encounter for immunization: Secondary | ICD-10-CM | POA: Diagnosis not present

## 2020-02-17 DIAGNOSIS — N401 Enlarged prostate with lower urinary tract symptoms: Secondary | ICD-10-CM | POA: Diagnosis not present

## 2020-02-17 DIAGNOSIS — Z131 Encounter for screening for diabetes mellitus: Secondary | ICD-10-CM

## 2020-02-17 DIAGNOSIS — I10 Essential (primary) hypertension: Secondary | ICD-10-CM | POA: Diagnosis not present

## 2020-02-17 DIAGNOSIS — R7309 Other abnormal glucose: Secondary | ICD-10-CM

## 2020-02-17 DIAGNOSIS — R262 Difficulty in walking, not elsewhere classified: Secondary | ICD-10-CM | POA: Diagnosis not present

## 2020-02-17 DIAGNOSIS — Z1329 Encounter for screening for other suspected endocrine disorder: Secondary | ICD-10-CM | POA: Diagnosis not present

## 2020-02-17 DIAGNOSIS — Z136 Encounter for screening for cardiovascular disorders: Secondary | ICD-10-CM | POA: Diagnosis not present

## 2020-02-17 DIAGNOSIS — Z79899 Other long term (current) drug therapy: Secondary | ICD-10-CM | POA: Diagnosis not present

## 2020-02-17 DIAGNOSIS — E559 Vitamin D deficiency, unspecified: Secondary | ICD-10-CM | POA: Diagnosis not present

## 2020-02-17 DIAGNOSIS — G4733 Obstructive sleep apnea (adult) (pediatric): Secondary | ICD-10-CM

## 2020-02-17 DIAGNOSIS — Z8249 Family history of ischemic heart disease and other diseases of the circulatory system: Secondary | ICD-10-CM | POA: Diagnosis not present

## 2020-02-17 DIAGNOSIS — R350 Frequency of micturition: Secondary | ICD-10-CM

## 2020-02-17 DIAGNOSIS — N138 Other obstructive and reflux uropathy: Secondary | ICD-10-CM

## 2020-02-17 DIAGNOSIS — Z0000 Encounter for general adult medical examination without abnormal findings: Secondary | ICD-10-CM | POA: Diagnosis not present

## 2020-02-17 DIAGNOSIS — M25661 Stiffness of right knee, not elsewhere classified: Secondary | ICD-10-CM | POA: Diagnosis not present

## 2020-02-17 DIAGNOSIS — Z13220 Encounter for screening for lipoid disorders: Secondary | ICD-10-CM

## 2020-02-17 DIAGNOSIS — R7303 Prediabetes: Secondary | ICD-10-CM

## 2020-02-17 DIAGNOSIS — Z0001 Encounter for general adult medical examination with abnormal findings: Secondary | ICD-10-CM

## 2020-02-17 DIAGNOSIS — Z1212 Encounter for screening for malignant neoplasm of rectum: Secondary | ICD-10-CM

## 2020-02-17 DIAGNOSIS — E782 Mixed hyperlipidemia: Secondary | ICD-10-CM

## 2020-02-17 DIAGNOSIS — M25461 Effusion, right knee: Secondary | ICD-10-CM | POA: Diagnosis not present

## 2020-02-17 DIAGNOSIS — M6281 Muscle weakness (generalized): Secondary | ICD-10-CM | POA: Diagnosis not present

## 2020-02-17 DIAGNOSIS — Z1211 Encounter for screening for malignant neoplasm of colon: Secondary | ICD-10-CM

## 2020-02-18 LAB — CBC WITH DIFFERENTIAL/PLATELET
Absolute Monocytes: 692 cells/uL (ref 200–950)
Basophils Absolute: 64 cells/uL (ref 0–200)
Basophils Relative: 0.7 %
Eosinophils Absolute: 209 cells/uL (ref 15–500)
Eosinophils Relative: 2.3 %
HCT: 36.9 % — ABNORMAL LOW (ref 38.5–50.0)
Hemoglobin: 12 g/dL — ABNORMAL LOW (ref 13.2–17.1)
Lymphs Abs: 2412 cells/uL (ref 850–3900)
MCH: 29.6 pg (ref 27.0–33.0)
MCHC: 32.5 g/dL (ref 32.0–36.0)
MCV: 90.9 fL (ref 80.0–100.0)
MPV: 10.4 fL (ref 7.5–12.5)
Monocytes Relative: 7.6 %
Neutro Abs: 5724 cells/uL (ref 1500–7800)
Neutrophils Relative %: 62.9 %
Platelets: 250 10*3/uL (ref 140–400)
RBC: 4.06 10*6/uL — ABNORMAL LOW (ref 4.20–5.80)
RDW: 12.8 % (ref 11.0–15.0)
Total Lymphocyte: 26.5 %
WBC: 9.1 10*3/uL (ref 3.8–10.8)

## 2020-02-18 LAB — URINALYSIS, ROUTINE W REFLEX MICROSCOPIC
Bilirubin Urine: NEGATIVE
Glucose, UA: NEGATIVE
Hgb urine dipstick: NEGATIVE
Ketones, ur: NEGATIVE
Leukocytes,Ua: NEGATIVE
Nitrite: NEGATIVE
Protein, ur: NEGATIVE
Specific Gravity, Urine: 1.018 (ref 1.001–1.03)
pH: 5.5 (ref 5.0–8.0)

## 2020-02-18 LAB — COMPLETE METABOLIC PANEL WITH GFR
AG Ratio: 1.7 (calc) (ref 1.0–2.5)
ALT: 7 U/L — ABNORMAL LOW (ref 9–46)
AST: 8 U/L — ABNORMAL LOW (ref 10–35)
Albumin: 3.8 g/dL (ref 3.6–5.1)
Alkaline phosphatase (APISO): 61 U/L (ref 35–144)
BUN: 16 mg/dL (ref 7–25)
CO2: 29 mmol/L (ref 20–32)
Calcium: 9.2 mg/dL (ref 8.6–10.3)
Chloride: 104 mmol/L (ref 98–110)
Creat: 0.7 mg/dL (ref 0.70–1.25)
GFR, Est African American: 114 mL/min/{1.73_m2} (ref >=60)
GFR, Est Non African American: 98 mL/min/{1.73_m2} (ref >=60)
Globulin: 2.3 g/dL (calc) (ref 1.9–3.7)
Glucose, Bld: 146 mg/dL — ABNORMAL HIGH (ref 65–99)
Potassium: 4.1 mmol/L (ref 3.5–5.3)
Sodium: 141 mmol/L (ref 135–146)
Total Bilirubin: 0.3 mg/dL (ref 0.2–1.2)
Total Protein: 6.1 g/dL (ref 6.1–8.1)

## 2020-02-18 LAB — HEMOGLOBIN A1C
Hgb A1c MFr Bld: 5.4 % of total Hgb (ref <5.7)
Mean Plasma Glucose: 108 (calc)
eAG (mmol/L): 6 (calc)

## 2020-02-18 LAB — INSULIN, RANDOM: Insulin: 65.2 u[IU]/mL — ABNORMAL HIGH

## 2020-02-18 LAB — MICROALBUMIN / CREATININE URINE RATIO
Creatinine, Urine: 125 mg/dL (ref 20–320)
Microalb Creat Ratio: 9 mcg/mg creat (ref <30)
Microalb, Ur: 1.1 mg/dL

## 2020-02-18 LAB — LIPID PANEL
Cholesterol: 149 mg/dL (ref <200)
HDL: 51 mg/dL (ref >=40)
LDL Cholesterol (Calc): 81 mg/dL (calc)
Non-HDL Cholesterol (Calc): 98 mg/dL (calc) (ref <130)
Total CHOL/HDL Ratio: 2.9 (calc) (ref <5.0)
Triglycerides: 91 mg/dL (ref <150)

## 2020-02-18 LAB — PSA: PSA: 2.7 ng/mL (ref <=4.0)

## 2020-02-18 LAB — VITAMIN D 25 HYDROXY (VIT D DEFICIENCY, FRACTURES): Vit D, 25-Hydroxy: 61 ng/mL (ref 30–100)

## 2020-02-18 LAB — MAGNESIUM: Magnesium: 1.8 mg/dL (ref 1.5–2.5)

## 2020-02-18 LAB — TSH: TSH: 1.96 mIU/L (ref 0.40–4.50)

## 2020-02-21 DIAGNOSIS — M25461 Effusion, right knee: Secondary | ICD-10-CM | POA: Diagnosis not present

## 2020-02-21 DIAGNOSIS — M6281 Muscle weakness (generalized): Secondary | ICD-10-CM | POA: Diagnosis not present

## 2020-02-21 DIAGNOSIS — M25561 Pain in right knee: Secondary | ICD-10-CM | POA: Diagnosis not present

## 2020-02-21 DIAGNOSIS — M25661 Stiffness of right knee, not elsewhere classified: Secondary | ICD-10-CM | POA: Diagnosis not present

## 2020-02-24 DIAGNOSIS — M6281 Muscle weakness (generalized): Secondary | ICD-10-CM | POA: Diagnosis not present

## 2020-02-24 DIAGNOSIS — M25461 Effusion, right knee: Secondary | ICD-10-CM | POA: Diagnosis not present

## 2020-02-24 DIAGNOSIS — R262 Difficulty in walking, not elsewhere classified: Secondary | ICD-10-CM | POA: Diagnosis not present

## 2020-02-24 DIAGNOSIS — M25661 Stiffness of right knee, not elsewhere classified: Secondary | ICD-10-CM | POA: Diagnosis not present

## 2020-04-28 DIAGNOSIS — M25461 Effusion, right knee: Secondary | ICD-10-CM | POA: Diagnosis not present

## 2020-05-22 DIAGNOSIS — K219 Gastro-esophageal reflux disease without esophagitis: Secondary | ICD-10-CM | POA: Insufficient documentation

## 2020-05-22 NOTE — Progress Notes (Signed)
FOLLOW UP  Assessment and Plan:   Hypertension Mildly above goal; continue valsartan for now, work on DASH Monitor blood pressure at home; patient to call if consistently greater than 130/80 Reviewed DASH diet.   Reminder to go to the ER if any CP, SOB, nausea, dizziness, severe HA, changes vision/speech, left arm numbness and tingling and jaw pain.  Cholesterol Currently at goal;  Continue low cholesterol diet and exercise.  Check lipid panel.   Prediabetes Most recent A1C improved and at goal Continue diet and exercise.  Perform daily foot/skin check, notify office of any concerning changes.  Check A1C q45m; defer today  BMI 25 Long discussion about weight loss, diet, and exercise Recommended diet heavy in fruits and veggies and low in animal meats, cheeses, and dairy products, appropriate calorie intake Discussed ideal weight for height  Will follow up in 3 months  Vitamin D Def At goal at last visit; continue supplementation to maintain goal of 60-100 Defer Vit D level  Continue diet and meds as discussed. Further disposition pending results of labs. Discussed med's effects and SE's.   Over 30 minutes of exam, counseling, chart review, and critical decision making was performed.   Future Appointments  Date Time Provider Department Center  08/29/2020  9:30 AM [name], MD [facility] None  02/21/2021 10:00 AM [name], MD [facility] None    ----------------------------------------------------------------------------------------------------------------------  HPI 64 y.o. male  presents for 3 month follow up on hypertension, cholesterol, prediabetes, weight and vitamin D deficiency.   He had R knee replaced by Dr. Eulah Pont, has recovered well, but doesn't quite have full extension recovered, will get stiff after sitting and end up limping. Thinking about restarting PT via ortho.   BMI is Body mass index is 25.25 kg/m., he has been working on diet and  exercise, walking regularly and mowing.  Wt Readings from Last 3 Encounters:  05/23/20 171 lb (77.6 kg)  02/17/20 165 lb 3.2 oz (74.9 kg)  01/11/20 170 lb 5 oz (77.3 kg)   His blood pressure has been controlled at home, today their BP is BP: 138/80  He does workout. He denies chest pain, shortness of breath, dizziness.   He is on cholesterol medication Simvastatin 40 mg and denies myalgias. His cholesterol is at goal. The cholesterol last visit was:   Lab Results  Component Value Date   CHOL 149 02/17/2020   HDL 51 02/17/2020   LDLCALC 81 02/17/2020   TRIG 91 02/17/2020   CHOLHDL 2.9 02/17/2020    He has been working on diet and exercise for hx of prediabetes (predates 2015, A1C in 12/2019 was 6.1%), and denies increased appetite, nausea, paresthesia of the feet, polydipsia, polyuria and visual disturbances. Last A1C in the office was:  Lab Results  Component Value Date   HGBA1C 5.4 02/17/2020   Patient is on Vitamin D supplement.   Lab Results  Component Value Date   VD25OH 61 02/17/2020        Current Medications:  Current Outpatient Medications on File Prior to Visit  Medication Sig  . Cholecalciferol 5000 units capsule Take 1 capsule (5,000 Units total) by mouth daily.  Marland Kitchen omeprazole (PRILOSEC) 20 MG capsule Take 1 capsule (20 mg total) by mouth daily. 30 days for gastroprotection while taking NSAIDs.  Marland Kitchen simvastatin (ZOCOR) 40 MG tablet TAKE 1 TABLET BY MOUTH AT BEDTIME FOR CHOLESTEROL  . valsartan (DIOVAN) 80 MG tablet Take 1 tablet Daily for BP (Patient taking differently: Take 80 mg by mouth  daily. )  . aspirin EC 81 MG tablet Take 1 tablet (81 mg total) by mouth 2 (two) times daily. For DVT prophylaxis for 30 days after surgery. (Patient not taking: Reported on 02/17/2020)   No current facility-administered medications on file prior to visit.     Allergies: No Known Allergies   Medical History:  Past Medical History:  Diagnosis Date  . Heart murmur   .  Hyperlipidemia   . Hypertension   . Shingles   . Vitamin D deficiency    Family history- Reviewed and unchanged Social history- Reviewed and unchanged   Review of Systems:  Review of Systems  Constitutional: Negative for malaise/fatigue and weight loss.  HENT: Negative for hearing loss and tinnitus.   Eyes: Negative for blurred vision and double vision.  Respiratory: Negative for cough, shortness of breath and wheezing.   Cardiovascular: Negative for chest pain, palpitations, orthopnea, claudication and leg swelling.  Gastrointestinal: Negative for abdominal pain, blood in stool, constipation, diarrhea, heartburn, melena, nausea and vomiting.  Genitourinary: Negative.   Musculoskeletal: Negative for joint pain and myalgias.  Skin: Negative for rash.  Neurological: Negative for dizziness, tingling, sensory change, weakness and headaches.  Endo/Heme/Allergies: Negative for polydipsia.  Psychiatric/Behavioral: Negative.   All other systems reviewed and are negative.     Physical Exam: BP 138/80   Pulse 71   Temp 97.7 F (36.5 C)   Wt 171 lb (77.6 kg)   SpO2 97%   BMI 25.25 kg/m  Wt Readings from Last 3 Encounters:  05/23/20 171 lb (77.6 kg)  02/17/20 165 lb 3.2 oz (74.9 kg)  01/11/20 170 lb 5 oz (77.3 kg)   General Appearance: Well nourished, in no apparent distress. Eyes: PERRLA, EOMs, conjunctiva no swelling or erythema Sinuses: No Frontal/maxillary tenderness ENT/Mouth: Ext aud canals clear, TMs without erythema, bulging. No erythema, swelling, or exudate on post pharynx.  Tonsils not swollen or erythematous. Hearing normal.  Neck: Supple, thyroid normal.  Respiratory: Respiratory effort normal, BS equal bilaterally without rales, rhonchi, wheezing or stridor.  Cardio: RRR with no MRGs. Brisk peripheral pulses without edema.  Abdomen: Soft, + BS.  Non tender, no guarding, rebound, hernias, masses. Lymphatics: Non tender without lymphadenopathy.  Musculoskeletal:  Full ROM except R knee with extension approx 175 degrees, 5/5 strength, Mildly antalgic gait Skin: Warm, dry without rashes, lesions, ecchymosis.  Neuro: Cranial nerves intact. No cerebellar symptoms.  Psych: Awake and oriented X 3, normal affect, Insight and Judgment appropriate.    Dan Maker, NP 8:45 AM [facility]

## 2020-05-23 ENCOUNTER — Ambulatory Visit (INDEPENDENT_AMBULATORY_CARE_PROVIDER_SITE_OTHER): Payer: BC Managed Care – PPO | Admitting: Adult Health

## 2020-05-23 ENCOUNTER — Other Ambulatory Visit: Payer: Self-pay

## 2020-05-23 ENCOUNTER — Encounter: Payer: Self-pay | Admitting: Adult Health

## 2020-05-23 VITALS — BP 138/80 | HR 71 | Temp 97.7°F | Wt 171.0 lb

## 2020-05-23 DIAGNOSIS — E782 Mixed hyperlipidemia: Secondary | ICD-10-CM | POA: Diagnosis not present

## 2020-05-23 DIAGNOSIS — Z79899 Other long term (current) drug therapy: Secondary | ICD-10-CM | POA: Diagnosis not present

## 2020-05-23 DIAGNOSIS — I10 Essential (primary) hypertension: Secondary | ICD-10-CM

## 2020-05-23 DIAGNOSIS — Z6825 Body mass index (BMI) 25.0-25.9, adult: Secondary | ICD-10-CM

## 2020-05-23 DIAGNOSIS — R7303 Prediabetes: Secondary | ICD-10-CM

## 2020-05-23 DIAGNOSIS — K219 Gastro-esophageal reflux disease without esophagitis: Secondary | ICD-10-CM

## 2020-05-23 DIAGNOSIS — E559 Vitamin D deficiency, unspecified: Secondary | ICD-10-CM

## 2020-05-23 NOTE — Patient Instructions (Addendum)
Goals    . Blood Pressure < 130/80       Please send [place] a photo of covid 19 vaccine card      HYPERTENSION INFORMATION  Monitor your blood pressure at home, please keep a record and bring that in with you to your next office visit.   Go to the ER if any CP, SOB, nausea, dizziness, severe HA, changes vision/speech  Testing/Procedures: HOW TO TAKE YOUR BLOOD PRESSURE:  Rest 5 minutes before taking your blood pressure.  Don't smoke or drink caffeinated beverages for at least 30 minutes before.  Take your blood pressure before (not after) you eat.  Sit comfortably with your back supported and both feet on the floor (don't cross your legs).  Elevate your arm to heart level on a table or a desk.  Use the proper sized cuff. It should fit smoothly and snugly around your bare upper arm. There should be enough room to slip a fingertip under the cuff. The bottom edge of the cuff should be 1 inch above the crease of the elbow.  Due to a recent study, SPRINT, we have changed our goal for the systolic or top blood pressure number. Ideally we want your top number at 120.  In the [facility] Trial, 5000 people were randomized to a goal BP of 120 and 5000 people were randomized to a goal BP of less than 140. The patients with the goal BP at 120 had LESS DEMENTIA, LESS HEART ATTACKS, AND LESS STROKES, AS WELL AS OVERALL DECREASED MORTALITY OR DEATH RATE.   There was another study that showed taking your blood pressure medications at night decrease cardiovascular events.  However if you are on a fluid pill, please take this in the morning.   If you are willing, our goal BP is the top number of 120.   Your most recent BP: BP: 138/80   Take your medications faithfully as instructed. Maintain a healthy weight. Get at least 150 minutes of aerobic exercise per week. Minimize salt intake. Minimize alcohol intake  DASH Eating Plan DASH stands for "Dietary Approaches to Stop Hypertension." The DASH  eating plan is a healthy eating plan that has been shown to reduce high blood pressure (hypertension). Additional health benefits may include reducing the risk of type 2 diabetes mellitus, heart disease, and stroke. The DASH eating plan may also help with weight loss. WHAT DO I NEED TO KNOW ABOUT THE DASH EATING PLAN? For the DASH eating plan, you will follow these general guidelines:  Choose foods with a percent daily value for sodium of less than 5% (as listed on the food label).  Use salt-free seasonings or herbs instead of table salt or sea salt.  Check with your health care provider or pharmacist before using salt substitutes.  Eat lower-sodium products, often labeled as "lower sodium" or "no salt added."  Eat fresh foods.  Eat more vegetables, fruits, and low-fat dairy products.  Choose whole grains. Look for the word "whole" as the first word in the ingredient list.  Choose fish and skinless chicken or [place] more often than red meat. Limit fish, poultry, and meat to 6 oz (170 g) each day.  Limit sweets, desserts, sugars, and sugary drinks.  Choose heart-healthy fats.  Limit cheese to 1 oz (28 g) per day.  Eat more home-cooked food and less restaurant, buffet, and fast food.  Limit fried foods.  Cook foods using methods other than frying.  Limit canned vegetables. If you do use  them, rinse them well to decrease the sodium.  When eating at a restaurant, ask that your food be prepared with less salt, or no salt if possible. WHAT FOODS CAN I EAT? Seek help from a dietitian for individual calorie needs. Grains Whole grain or whole wheat bread. Brown rice. Whole grain or whole wheat pasta. Quinoa, bulgur, and whole grain cereals. Low-sodium cereals. Corn or whole wheat flour tortillas. Whole grain cornbread. Whole grain crackers. Low-sodium crackers. Vegetables Fresh or frozen vegetables (raw, steamed, roasted, or grilled). Low-sodium or reduced-sodium tomato and vegetable  juices. Low-sodium or reduced-sodium tomato sauce and paste. Low-sodium or reduced-sodium canned vegetables.  Fruits All fresh, canned (in natural juice), or frozen fruits. Meat and Other Protein Products Ground beef (85% or leaner), grass-fed beef, or beef trimmed of fat. Skinless chicken or [place]. Ground chicken or [place]. Pork trimmed of fat. All fish and seafood. Eggs. Dried beans, peas, or lentils. Unsalted nuts and seeds. Unsalted canned beans. Dairy Low-fat dairy products, such as skim or 1% milk, 2% or reduced-fat cheeses, low-fat ricotta or cottage cheese, or plain low-fat yogurt. Low-sodium or reduced-sodium cheeses. Fats and Oils Tub margarines without trans fats. Light or reduced-fat mayonnaise and salad dressings (reduced sodium). Avocado. Safflower, olive, or canola oils. Natural peanut or almond butter. Other Unsalted popcorn and pretzels. The items listed above may not be a complete list of recommended foods or beverages. Contact your dietitian for more options. WHAT FOODS ARE NOT RECOMMENDED? Grains White bread. White pasta. White rice. Refined cornbread. Bagels and croissants. Crackers that contain trans fat. Vegetables Creamed or fried vegetables. Vegetables in a cheese sauce. Regular canned vegetables. Regular canned tomato sauce and paste. Regular tomato and vegetable juices. Fruits Dried fruits. Canned fruit in light or heavy syrup. Fruit juice. Meat and Other Protein Products Fatty cuts of meat. Ribs, chicken wings, bacon, sausage, bologna, salami, chitterlings, fatback, hot dogs, bratwurst, and packaged luncheon meats. Salted nuts and seeds. Canned beans with salt. Dairy Whole or 2% milk, cream, half-and-half, and cream cheese. Whole-fat or sweetened yogurt. Full-fat cheeses or blue cheese. Nondairy creamers and whipped toppings. Processed cheese, cheese spreads, or cheese curds. Condiments Onion and garlic salt, seasoned salt, table salt, and sea salt. Canned and  packaged gravies. Worcestershire sauce. Tartar sauce. Barbecue sauce. Teriyaki sauce. Soy sauce, including reduced sodium. Steak sauce. Fish sauce. Oyster sauce. Cocktail sauce. Horseradish. Ketchup and mustard. Meat flavorings and tenderizers. Bouillon cubes. Hot sauce. Tabasco sauce. Marinades. Taco seasonings. Relishes. Fats and Oils Butter, stick margarine, lard, shortening, ghee, and bacon fat. Coconut, palm kernel, or palm oils. Regular salad dressings. Other Pickles and olives. Salted popcorn and pretzels. The items listed above may not be a complete list of foods and beverages to avoid. Contact your dietitian for more information. WHERE CAN I FIND MORE INFORMATION? National Heart, Lung, and Blood Institute: [employer] Document Released: 09/19/2011 Document Revised: 02/14/2014 Document Reviewed: 08/04/2013 [facility] Patient Information 2015 [place], [place]. This information is not intended to replace advice given to you by your health care provider. Make sure you discuss any questions you have with your health care provider.

## 2020-05-24 LAB — COMPLETE METABOLIC PANEL WITH GFR
AG Ratio: 1.6 (calc) (ref 1.0–2.5)
ALT: 8 U/L — ABNORMAL LOW (ref 9–46)
AST: 11 U/L (ref 10–35)
Albumin: 4.2 g/dL (ref 3.6–5.1)
Alkaline phosphatase (APISO): 76 U/L (ref 35–144)
BUN: 20 mg/dL (ref 7–25)
CO2: 29 mmol/L (ref 20–32)
Calcium: 9.4 mg/dL (ref 8.6–10.3)
Chloride: 105 mmol/L (ref 98–110)
Creat: 0.8 mg/dL (ref 0.70–1.25)
GFR, Est African American: 108 mL/min/{1.73_m2} (ref >=60)
GFR, Est Non African American: 93 mL/min/{1.73_m2} (ref >=60)
Globulin: 2.7 g/dL (calc) (ref 1.9–3.7)
Glucose, Bld: 80 mg/dL (ref 65–99)
Potassium: 4.8 mmol/L (ref 3.5–5.3)
Sodium: 142 mmol/L (ref 135–146)
Total Bilirubin: 0.4 mg/dL (ref 0.2–1.2)
Total Protein: 6.9 g/dL (ref 6.1–8.1)

## 2020-05-24 LAB — CBC WITH DIFFERENTIAL/PLATELET
Absolute Monocytes: 616 cells/uL (ref 200–950)
Basophils Absolute: 81 cells/uL (ref 0–200)
Basophils Relative: 1 %
Eosinophils Absolute: 292 cells/uL (ref 15–500)
Eosinophils Relative: 3.6 %
HCT: 39.4 % (ref 38.5–50.0)
Hemoglobin: 13 g/dL — ABNORMAL LOW (ref 13.2–17.1)
Lymphs Abs: 1993 cells/uL (ref 850–3900)
MCH: 28.8 pg (ref 27.0–33.0)
MCHC: 33 g/dL (ref 32.0–36.0)
MCV: 87.4 fL (ref 80.0–100.0)
MPV: 10.9 fL (ref 7.5–12.5)
Monocytes Relative: 7.6 %
Neutro Abs: 5119 cells/uL (ref 1500–7800)
Neutrophils Relative %: 63.2 %
Platelets: 245 10*3/uL (ref 140–400)
RBC: 4.51 10*6/uL (ref 4.20–5.80)
RDW: 13 % (ref 11.0–15.0)
Total Lymphocyte: 24.6 %
WBC: 8.1 10*3/uL (ref 3.8–10.8)

## 2020-05-24 LAB — TSH: TSH: 2.06 mIU/L (ref 0.40–4.50)

## 2020-05-24 LAB — MAGNESIUM: Magnesium: 1.8 mg/dL (ref 1.5–2.5)

## 2020-05-24 LAB — LIPID PANEL
Cholesterol: 168 mg/dL (ref <200)
HDL: 47 mg/dL (ref >=40)
LDL Cholesterol (Calc): 104 mg/dL (calc) — ABNORMAL HIGH
Non-HDL Cholesterol (Calc): 121 mg/dL (calc) (ref <130)
Total CHOL/HDL Ratio: 3.6 (calc) (ref <5.0)
Triglycerides: 77 mg/dL (ref <150)

## 2020-05-31 ENCOUNTER — Other Ambulatory Visit: Payer: Self-pay | Admitting: Internal Medicine

## 2020-06-01 DIAGNOSIS — M25661 Stiffness of right knee, not elsewhere classified: Secondary | ICD-10-CM | POA: Diagnosis not present

## 2020-08-29 ENCOUNTER — Encounter: Payer: Self-pay | Admitting: Internal Medicine

## 2020-08-29 ENCOUNTER — Other Ambulatory Visit: Payer: Self-pay

## 2020-08-29 ENCOUNTER — Ambulatory Visit (INDEPENDENT_AMBULATORY_CARE_PROVIDER_SITE_OTHER): Payer: BC Managed Care – PPO | Admitting: Internal Medicine

## 2020-08-29 VITALS — BP 130/82 | HR 83 | Temp 97.1°F | Resp 16 | Ht 69.0 in | Wt 175.8 lb

## 2020-08-29 DIAGNOSIS — I10 Essential (primary) hypertension: Secondary | ICD-10-CM

## 2020-08-29 DIAGNOSIS — Z79899 Other long term (current) drug therapy: Secondary | ICD-10-CM

## 2020-08-29 DIAGNOSIS — E559 Vitamin D deficiency, unspecified: Secondary | ICD-10-CM | POA: Diagnosis not present

## 2020-08-29 DIAGNOSIS — R7309 Other abnormal glucose: Secondary | ICD-10-CM

## 2020-08-29 DIAGNOSIS — E782 Mixed hyperlipidemia: Secondary | ICD-10-CM | POA: Diagnosis not present

## 2020-08-29 DIAGNOSIS — K219 Gastro-esophageal reflux disease without esophagitis: Secondary | ICD-10-CM

## 2020-08-29 NOTE — Progress Notes (Signed)
History of Present Illness:       This very nice 64 y.o.  DWM presents for 6 month follow up with HTN, HLD, Pre-Diabetes and Vitamin D Deficiency. Patient underwent Rt TKA on Mar 30,2021. Patient's GERD is controlled on his Meds.       Patient is treated for HTN (2004)  & BP has been controlled at home. Today's BP is at goal -  130/82. Patient has had no complaints of any cardiac type chest pain, palpitations, dyspnea / orthopnea / PND, dizziness, claudication, or dependent edema.       Hyperlipidemia is near controlled with diet & Simvastatin. Patient denies myalgias or other med SE's. Last Lipids were near goal:  Lab Results  Component Value Date   CHOL 168 05/23/2020   HDL 47 05/23/2020   LDLCALC 104 (H) 05/23/2020   TRIG 77 05/23/2020   CHOLHDL 3.6 05/23/2020    Also, the patient has history of PreDiabetes (A1c 6.0% /2015)and has had no symptoms of reactive hypoglycemia, diabetic polys, paresthesias or visual blurring.  Last A1c was  Normal & at goal:  Lab Results  Component Value Date   HGBA1C 5.4 02/17/2020           Further, the patient also has history of Vitamin D Deficiency ("35" /2009) and supplements vitamin D without any suspected side-effects. Last vitamin D was at goal:  Lab Results  Component Value Date   VD25OH 61 02/17/2020    Current Outpatient Medications on File Prior to Visit  Medication Sig  . Cholecalciferol 5000 units capsule Take 1 capsule  daily.  . simvastatin 40 MG tablet TAKE 1 TABLET  AT BEDTIME   . valsartan 80 MG tablet TAKE 1 TABLET DAILY     No Known Allergies  PMHx:   Past Medical History:  Diagnosis Date  . Heart murmur   . Hyperlipidemia   . Hypertension   . Shingles   . Vitamin D deficiency     Immunization History  Administered Date(s) Administered  . Influenza Inj Mdck Quad With Preservative 09/02/2017  . PPD Test 09/02/2017  . Pneumococcal Conjugate-13 01/18/2019  . Pneumococcal Polysaccharide-23 02/17/2020    . Tdap 10/19/2008    Past Surgical History:  Procedure Laterality Date  . ARTHROSCOPY WITH ANTERIOR CRUCIATE LIGAMENT (ACL) REPAIR WITH ANTERIOR TIBILIAS GRAFT Right   . INGUINAL HERNIA REPAIR Bilateral 09/28/2015   Procedure: LAPAROSCOPIC BILATERAL INGUINAL HERNIA REPAIR;  Surgeon: Gaynelle Adu, MD;  Location: [facility];  Service: General;  Laterality: Bilateral;  . INSERTION OF MESH Bilateral 09/28/2015   Procedure: INSERTION OF MESH;  Surgeon: Gaynelle Adu, MD;  Location: [facility];  Service: General;  Laterality: Bilateral;  . SHOULDER ARTHROSCOPY    . TOTAL KNEE ARTHROPLASTY Right 01/11/2020   Procedure: TOTAL KNEE ARTHROPLASTY;  Surgeon: Sheral Apley, MD;  Location: [facility];  Service: Orthopedics;  Laterality: Right;    FHx:    Reviewed / unchanged  SHx:    Reviewed / unchanged   Systems Review:  Constitutional: Denies fever, chills, wt changes, headaches, insomnia, fatigue, night sweats, change in appetite. Eyes: Denies redness, blurred vision, diplopia, discharge, itchy, watery eyes.  ENT: Denies discharge, congestion, post nasal drip, epistaxis, sore throat, earache, hearing loss, dental pain, tinnitus, vertigo, sinus pain, snoring.  CV: Denies chest pain, palpitations, irregular heartbeat, syncope, dyspnea, diaphoresis, orthopnea, PND, claudication or edema. Respiratory: denies cough, dyspnea, DOE, pleurisy, hoarseness, laryngitis, wheezing.  Gastrointestinal: Denies dysphagia, odynophagia, heartburn, reflux, water  brash, abdominal pain or cramps, nausea, vomiting, bloating, diarrhea, constipation, hematemesis, melena, hematochezia  or hemorrhoids. Genitourinary: Denies dysuria, frequency, urgency, nocturia, hesitancy, discharge, hematuria or flank pain. Musculoskeletal: Denies arthralgias, myalgias, stiffness, jt. swelling, pain, limping or strain/sprain.  Skin: Denies pruritus, rash, hives, warts, acne, eczema or change in skin lesion(s). Neuro: No weakness, tremor,  incoordination, spasms, paresthesia or pain. Psychiatric: Denies confusion, memory loss or sensory loss. Endo: Denies change in weight, skin or hair change.  Heme/Lymph: No excessive bleeding, bruising or enlarged lymph nodes.  Physical Exam  BP 130/82   Pulse 83   Temp (!) 97.1 F (36.2 C)   Resp 16   Ht 5\' 9"  (1.753 m)   Wt 175 lb 12.8 oz (79.7 kg)   SpO2 95%   BMI 25.96 kg/m   Appears  well nourished, well groomed  and in no distress.  Eyes: PERRLA, EOMs, conjunctiva no swelling or erythema. Sinuses: No frontal/maxillary tenderness ENT/Mouth: EAC's clear, TM's nl w/o erythema, bulging. Nares clear w/o erythema, swelling, exudates. Oropharynx clear without erythema or exudates. Oral hygiene is good. Tongue normal, non obstructing. Hearing intact.  Neck: Supple. Thyroid not palpable. Car 2+/2+ without bruits, nodes or JVD. Chest: Respirations nl with BS clear & equal w/o rales, rhonchi, wheezing or stridor.  Cor: Heart sounds normal w/ regular rate and rhythm without sig. murmurs, gallops, clicks or rubs. Peripheral pulses normal and equal  without edema.  Abdomen: Soft & bowel sounds normal. Non-tender w/o guarding, rebound, hernias, masses or organomegaly.  Lymphatics: Unremarkable.  Musculoskeletal: Full ROM all peripheral extremities, joint stability, 5/5 strength and normal gait.  Skin: Warm, dry without exposed rashes, lesions or ecchymosis apparent.  Neuro: Cranial nerves intact, reflexes equal bilaterally. Sensory-motor testing grossly intact. Tendon reflexes grossly intact.  Pysch: Alert & oriented x 3.  Insight and judgement nl & appropriate. No ideations.  Assessment and Plan:  1. Essential hypertension  - Continue medication, monitor blood pressure at home.  - Continue DASH diet.  Reminder to go to the ER if any CP,  SOB, nausea, dizziness, severe HA, changes vision/speech.  - CBC with Differential/Platelet - COMPLETE METABOLIC PANEL WITH GFR - Magnesium -  TSH  2. Hyperlipidemia, mixed  - Continue diet/meds, exercise,& lifestyle modifications.  - Continue monitor periodic cholesterol/liver & renal functions   - Lipid panel - TSH  3. Abnormal glucose  - Continue diet, exercise  - Lifestyle modifications.  - Monitor appropriate labs.  - Hemoglobin A1c - Insulin, random  4. Vitamin D deficiency  - Continue supplementation.  - VITAMIN D 25 Hydroxy  5. Gastroesophageal reflux disease   - CBC with Differential/Platelet  6. Medication management   - CBC with Differential/Platelet - COMPLETE METABOLIC PANEL WITH GFR - Magnesium - Lipid panel - TSH - Hemoglobin A1c - Insulin, random - VITAMIN D 25 Hydroxy         Discussed  regular exercise, BP monitoring, weight control to achieve/maintain BMI less than 25 and discussed med and SE's. Recommended labs to assess and monitor clinical status with further disposition pending results of labs.  [name] The patient was provided an opportunity to ask questions and all were answered. The patient agreed with the plan and demonstrated an understanding of the instructions.  I provided over 30 minutes of exam, counseling, chart review and  complex critical decision making.         The patient was advised to call back or  seek an in-person evaluation if the symptoms worsen or if the condition fails to improve as anticipated.   Kirtland Bouchard, MD

## 2020-08-29 NOTE — Patient Instructions (Signed)

## 2020-08-30 LAB — TSH: TSH: 1.75 mIU/L (ref 0.40–4.50)

## 2020-08-30 LAB — CBC WITH DIFFERENTIAL/PLATELET
Absolute Monocytes: 464 cells/uL (ref 200–950)
Basophils Absolute: 61 cells/uL (ref 0–200)
Basophils Relative: 1 %
Eosinophils Absolute: 250 cells/uL (ref 15–500)
Eosinophils Relative: 4.1 %
HCT: 39.5 % (ref 38.5–50.0)
Hemoglobin: 13 g/dL — ABNORMAL LOW (ref 13.2–17.1)
Lymphs Abs: 1800 cells/uL (ref 850–3900)
MCH: 29.1 pg (ref 27.0–33.0)
MCHC: 32.9 g/dL (ref 32.0–36.0)
MCV: 88.6 fL (ref 80.0–100.0)
MPV: 10.5 fL (ref 7.5–12.5)
Monocytes Relative: 7.6 %
Neutro Abs: 3526 cells/uL (ref 1500–7800)
Neutrophils Relative %: 57.8 %
Platelets: 281 10*3/uL (ref 140–400)
RBC: 4.46 10*6/uL (ref 4.20–5.80)
RDW: 12.1 % (ref 11.0–15.0)
Total Lymphocyte: 29.5 %
WBC: 6.1 10*3/uL (ref 3.8–10.8)

## 2020-08-30 LAB — COMPLETE METABOLIC PANEL WITH GFR
AG Ratio: 1.8 (calc) (ref 1.0–2.5)
ALT: 10 U/L (ref 9–46)
AST: 12 U/L (ref 10–35)
Albumin: 4.2 g/dL (ref 3.6–5.1)
Alkaline phosphatase (APISO): 70 U/L (ref 35–144)
BUN: 19 mg/dL (ref 7–25)
CO2: 29 mmol/L (ref 20–32)
Calcium: 9.5 mg/dL (ref 8.6–10.3)
Chloride: 106 mmol/L (ref 98–110)
Creat: 0.75 mg/dL (ref 0.70–1.25)
GFR, Est African American: 111 mL/min/{1.73_m2} (ref >=60)
GFR, Est Non African American: 96 mL/min/{1.73_m2} (ref >=60)
Globulin: 2.4 g/dL (calc) (ref 1.9–3.7)
Glucose, Bld: 101 mg/dL — ABNORMAL HIGH (ref 65–99)
Potassium: 4.7 mmol/L (ref 3.5–5.3)
Sodium: 142 mmol/L (ref 135–146)
Total Bilirubin: 0.5 mg/dL (ref 0.2–1.2)
Total Protein: 6.6 g/dL (ref 6.1–8.1)

## 2020-08-30 LAB — LIPID PANEL
Cholesterol: 152 mg/dL (ref <200)
HDL: 43 mg/dL (ref >=40)
LDL Cholesterol (Calc): 92 mg/dL (calc)
Non-HDL Cholesterol (Calc): 109 mg/dL (calc) (ref <130)
Total CHOL/HDL Ratio: 3.5 (calc) (ref <5.0)
Triglycerides: 76 mg/dL (ref <150)

## 2020-08-30 LAB — HEMOGLOBIN A1C
Hgb A1c MFr Bld: 6 % of total Hgb — ABNORMAL HIGH (ref <5.7)
Mean Plasma Glucose: 126 (calc)
eAG (mmol/L): 7 (calc)

## 2020-08-30 LAB — INSULIN, RANDOM: Insulin: 20.4 u[IU]/mL — ABNORMAL HIGH

## 2020-08-30 LAB — VITAMIN D 25 HYDROXY (VIT D DEFICIENCY, FRACTURES): Vit D, 25-Hydroxy: 65 ng/mL (ref 30–100)

## 2020-08-30 LAB — MAGNESIUM: Magnesium: 1.9 mg/dL (ref 1.5–2.5)

## 2020-08-30 NOTE — Progress Notes (Signed)
========================================================== -   Test results slightly outside the reference range are not unusual. If there is anything important, I will review this with you,  otherwise it is considered normal test values.  If you have further questions,  please do not hesitate to contact me at the office or via My Chart.  ==========================================================  -  Total Chol = 152 and LDL Chol = 92 . Both  Excellent   - Very low risk for Heart Attack  / Stroke =============================================================  - A1c- up from 5.4% to now 6.0% - too high   (Ideal or Goal is less than 5.7%) ==========================================================  - Vitamin D  = 65 - excellent ==========================================================  - All Else - CBC - Kidneys - Electrolytes - Liver - Magnesium & Thyroid    - all  Normal / OK ==========================================================   - Keep up the [place] Work   ! ==========================================================

## 2020-10-17 ENCOUNTER — Other Ambulatory Visit: Payer: Self-pay

## 2020-10-17 ENCOUNTER — Ambulatory Visit (INDEPENDENT_AMBULATORY_CARE_PROVIDER_SITE_OTHER): Payer: BC Managed Care – PPO | Admitting: Internal Medicine

## 2020-10-17 ENCOUNTER — Encounter: Payer: Self-pay | Admitting: Internal Medicine

## 2020-10-17 VITALS — BP 140/80 | HR 97 | Temp 97.7°F | Ht 69.0 in | Wt 170.2 lb

## 2020-10-17 DIAGNOSIS — K921 Melena: Secondary | ICD-10-CM | POA: Diagnosis not present

## 2020-10-17 LAB — CBC WITH DIFFERENTIAL/PLATELET
Absolute Monocytes: 717 cells/uL (ref 200–950)
Basophils Absolute: 91 cells/uL (ref 0–200)
Basophils Relative: 0.9 %
Eosinophils Absolute: 384 cells/uL (ref 15–500)
Eosinophils Relative: 3.8 %
HCT: 39.5 % (ref 38.5–50.0)
Hemoglobin: 13.1 g/dL — ABNORMAL LOW (ref 13.2–17.1)
Lymphs Abs: 3040 cells/uL (ref 850–3900)
MCH: 29.2 pg (ref 27.0–33.0)
MCHC: 33.2 g/dL (ref 32.0–36.0)
MCV: 88 fL (ref 80.0–100.0)
MPV: 11.3 fL (ref 7.5–12.5)
Monocytes Relative: 7.1 %
Neutro Abs: 5868 cells/uL (ref 1500–7800)
Neutrophils Relative %: 58.1 %
Platelets: 251 10*3/uL (ref 140–400)
RBC: 4.49 10*6/uL (ref 4.20–5.80)
RDW: 12.7 % (ref 11.0–15.0)
Total Lymphocyte: 30.1 %
WBC: 10.1 10*3/uL (ref 3.8–10.8)

## 2020-10-17 NOTE — Progress Notes (Signed)
   History of Present Illness:      This very nice 65 y.o.  DWM with HTN, HLD, Pre-Diabetes and Vitamin D Deficiency presents  or evaluation of  a 1 week prodrome of lower abdominal and back pain & noted blood in BM's x 2 . Patient has hx/o a negative Colon in 2014 by Dr Kinnie Scales and was recommended a 10 year f/u (due 2024).   Medications  .  simvastatin 40 MG tablet, TAKE 1 TABLET AT BEDTIME   .  valsartan  80 MG tablet, TAKE 1 TABLET DAILY   .  Cholecalciferol 5,000 units capsule, Take 1 capsule daily.  Problem list He has Hyperlipidemia, mixed; Heart murmur; Essential hypertension; FHx: heart disease; Noncompliance; BMI 25.0-25.9,adult; Prediabetes; Vitamin D deficiency; OSA (obstructive sleep apnea); Fatigue; Primary osteoarthritis of right knee; and Acid reflux on their problem list.   Observations/Objective:  BP 140/80   Pulse 97   Temp 97.7 F (36.5 C)   Ht 5\' 9"  (1.753 m)   Wt 170 lb 3.2 oz (77.2 kg)   SpO2 95%   BMI 25.13 kg/m   HEENT - WNL. Neck - supple.  Chest - Clear equal BS. Cor - Nl HS. RRR w/o sig MGR. PP 1(+). No edema. Abd - Soft with slight LLQ tenderness with deep palpitation.   No organomegaly, masses or tenderness. GU/DRE - No ext hemorrhoids, tags, fissures or fistulas.  Digital Exam finds nl rectal tone w/o palpable internal masses or  hemorrhoids.  Stool - light brown & trace hemoccult (+).  Prostate 1-2 (+) enlarged , firm, smooth  & non-tender MS- FROM w/o deformities.  Gait Nl. Neuro -  Nl w/o focal abnormalities.  Assessment and Plan:   1. Hematochezia  - CBC with Differential/Platelet  - Ambulatory referral to Gastroenterology (Dr )   Follow Up Instructions:       [name] The patient was provided an opportunity to ask questions and all were answered. The patient agreed with the plan and demonstrated an understanding of the instructions.        The patient was advised to  call back or seek an in-person evaluation if the symptoms worsen or if the condition fails to improve as anticipated.   Kinnie Scales, MD

## 2020-10-17 NOTE — Patient Instructions (Signed)
Rectal Bleeding  Rectal bleeding is when blood passes out of the anus. People with rectal bleeding may notice bright red blood in their underwear or in the toilet after having a bowel movement. They may also have dark red or black stools. Rectal bleeding is usually a sign that something is wrong. Many things can cause rectal bleeding, including:  Hemorrhoids. These are blood vessels in the anus or rectum that are larger than normal.  Fistulas. These are abnormal passages in the rectum and anus.  Anal fissures. This is a tear in the anus.  Diverticulosis. This is a condition in which pockets or sacs project from the bowel.  Proctitis and colitis. These are conditions in which the rectum, colon, or anus become inflamed.  Polyps. These are growths that can be cancerous (malignant) or non-cancerous (benign).  Part of the rectum sticking out from the anus (rectal prolapse).  Certain medicines.  Intestinal infections. Follow these instructions at home: Pay attention to any changes in your symptoms. Take these actions to help lessen bleeding and discomfort:  Eat a diet that is high in fiber. This will keep your stool soft, making it easier to pass stools without straining. Ask your health care provider what foods and drinks are high in fiber.  Drink enough fluid to keep your urine clear or pale yellow. This also helps to keep your stool soft.  Try taking a warm bath. This may help soothe any pain in your rectum.  Keep all follow-up visits as told by your health care provider. This is important. Get help right away if:  You have new or increased rectal bleeding.  You have black or dark red stools.  You vomit blood or something that looks like coffee grounds.  You have pain or tenderness in your abdomen.  You have a fever.  You feel weak.  You feel nauseous.  You faint.  You have severe pain in your rectum.  You cannot have a bowel  movement. ===============================================  Rectal Bleeding  Rectal bleeding is when blood comes out of the opening of the butt (anus). People with this kind of bleeding may notice bright red blood in their underwear or in the toilet after they poop (have a bowel movement). They may also have dark red or black poop (stool). Rectal bleeding is often a sign that something is wrong. It needs to be checked by a doctor. Follow these instructions at home: Watch for any changes in your condition. Take these actions to help with bleeding and discomfort:  Eat a diet that is high in fiber. This will keep your poop soft so it is easier for you to poop without pushing too hard. Ask your doctor to tell you what foods and drinks are high in fiber.  Drink enough fluid to keep your pee (urine) clear or pale yellow. This also helps keep your poop soft.  Try taking a warm bath. This may help with pain.  Keep all follow-up visits as told by your doctor. This is important. Get help right away if:  You have new bleeding.  You have more bleeding than before.  You have black or dark red poop.  You throw up (vomit) blood or something that looks like coffee grounds.  You have pain or tenderness in your belly (abdomen).  You have a fever.  You feel weak.  You feel sick to your stomach (nauseous).  You pass out (faint).  You have very bad pain in your butt.  You cannot  poop.

## 2020-10-18 NOTE — Progress Notes (Signed)
========================================================== -   Test results slightly outside the reference range are not unusual. If there is anything important, I will review this with you,  otherwise it is considered normal test values.  If you have further questions,  please do not hesitate to contact me at the office or via My Chart.  ==========================================================  -  CBC is Normal - No signs of severe blood loss  ==========================================================

## 2020-10-26 DIAGNOSIS — R109 Unspecified abdominal pain: Secondary | ICD-10-CM | POA: Diagnosis not present

## 2020-10-26 DIAGNOSIS — K625 Hemorrhage of anus and rectum: Secondary | ICD-10-CM | POA: Diagnosis not present

## 2020-10-26 DIAGNOSIS — R1012 Left upper quadrant pain: Secondary | ICD-10-CM | POA: Diagnosis not present

## 2020-10-27 DIAGNOSIS — K625 Hemorrhage of anus and rectum: Secondary | ICD-10-CM | POA: Diagnosis not present

## 2020-10-27 DIAGNOSIS — R1012 Left upper quadrant pain: Secondary | ICD-10-CM | POA: Diagnosis not present

## 2020-10-27 DIAGNOSIS — R109 Unspecified abdominal pain: Secondary | ICD-10-CM | POA: Diagnosis not present

## 2020-11-03 DIAGNOSIS — I700 Atherosclerosis of aorta: Secondary | ICD-10-CM | POA: Diagnosis not present

## 2020-11-03 DIAGNOSIS — K625 Hemorrhage of anus and rectum: Secondary | ICD-10-CM | POA: Diagnosis not present

## 2020-11-03 DIAGNOSIS — R1012 Left upper quadrant pain: Secondary | ICD-10-CM | POA: Diagnosis not present

## 2020-11-03 DIAGNOSIS — K5730 Diverticulosis of large intestine without perforation or abscess without bleeding: Secondary | ICD-10-CM | POA: Diagnosis not present

## 2020-11-07 DIAGNOSIS — R1012 Left upper quadrant pain: Secondary | ICD-10-CM | POA: Diagnosis not present

## 2020-11-07 DIAGNOSIS — K219 Gastro-esophageal reflux disease without esophagitis: Secondary | ICD-10-CM | POA: Diagnosis not present

## 2020-11-07 DIAGNOSIS — K625 Hemorrhage of anus and rectum: Secondary | ICD-10-CM | POA: Diagnosis not present

## 2020-11-07 DIAGNOSIS — K648 Other hemorrhoids: Secondary | ICD-10-CM | POA: Diagnosis not present

## 2020-11-07 DIAGNOSIS — K5730 Diverticulosis of large intestine without perforation or abscess without bleeding: Secondary | ICD-10-CM | POA: Diagnosis not present

## 2020-11-07 LAB — HM COLONOSCOPY

## 2020-11-29 ENCOUNTER — Encounter: Payer: Self-pay | Admitting: Internal Medicine

## 2020-12-18 ENCOUNTER — Ambulatory Visit: Payer: BC Managed Care – PPO | Admitting: Adult Health Nurse Practitioner

## 2021-01-17 ENCOUNTER — Other Ambulatory Visit: Payer: Self-pay | Admitting: Adult Health

## 2021-02-19 ENCOUNTER — Encounter: Payer: BC Managed Care – PPO | Admitting: Internal Medicine

## 2021-02-21 ENCOUNTER — Encounter: Payer: BC Managed Care – PPO | Admitting: Internal Medicine

## 2021-03-02 DIAGNOSIS — L821 Other seborrheic keratosis: Secondary | ICD-10-CM | POA: Diagnosis not present

## 2021-03-02 DIAGNOSIS — D1801 Hemangioma of skin and subcutaneous tissue: Secondary | ICD-10-CM | POA: Diagnosis not present

## 2021-03-02 DIAGNOSIS — L820 Inflamed seborrheic keratosis: Secondary | ICD-10-CM | POA: Diagnosis not present

## 2021-04-09 ENCOUNTER — Encounter: Payer: Self-pay | Admitting: Internal Medicine

## 2021-04-09 DIAGNOSIS — I700 Atherosclerosis of aorta: Secondary | ICD-10-CM | POA: Insufficient documentation

## 2021-04-09 NOTE — Progress Notes (Addendum)
     C  A N  C  E L  L  E  D          

## 2021-04-09 NOTE — Patient Instructions (Signed)

## 2021-04-10 ENCOUNTER — Ambulatory Visit: Payer: BC Managed Care – PPO | Admitting: Internal Medicine

## 2021-04-16 DIAGNOSIS — N138 Other obstructive and reflux uropathy: Secondary | ICD-10-CM | POA: Insufficient documentation

## 2021-04-16 DIAGNOSIS — N401 Enlarged prostate with lower urinary tract symptoms: Secondary | ICD-10-CM | POA: Insufficient documentation

## 2021-04-16 DIAGNOSIS — Z136 Encounter for screening for cardiovascular disorders: Secondary | ICD-10-CM | POA: Insufficient documentation

## 2021-04-16 DIAGNOSIS — R7309 Other abnormal glucose: Secondary | ICD-10-CM | POA: Insufficient documentation

## 2021-04-30 ENCOUNTER — Other Ambulatory Visit: Payer: Self-pay | Admitting: Internal Medicine

## 2021-08-29 DIAGNOSIS — M25562 Pain in left knee: Secondary | ICD-10-CM | POA: Diagnosis not present

## 2021-09-19 DIAGNOSIS — M25562 Pain in left knee: Secondary | ICD-10-CM | POA: Diagnosis not present

## 2021-09-24 DIAGNOSIS — M25562 Pain in left knee: Secondary | ICD-10-CM | POA: Diagnosis not present

## 2021-11-07 ENCOUNTER — Other Ambulatory Visit: Payer: Self-pay | Admitting: Adult Health Nurse Practitioner

## 2021-11-07 NOTE — Progress Notes (Signed)
FOLLOW UP  Assessment and Plan:   Hypertension Well controlled; continue valsartan,  work on DASH Monitor blood pressure at home; patient to call if consistently greater than 130/80 Reminder to go to the ER if any CP, SOB, nausea, dizziness, severe HA, changes vision/speech, left arm numbness and tingling and jaw pain. - CBC  Aortic Atherosclerosis by ABD CT 11/03/20 Continue to control blood pressure, cholesterol and weight  Cholesterol Currently at goal;  Continue low cholesterol diet and exercise.  Check lipid panel.  Check TSH Check CMP  Prediabetes Most recent A1C improved and at goal Continue diet and exercise.  Perform daily foot/skin check, notify office of any concerning changes.  Check A1C q46m; Check CMP  BMI 25 Long discussion about weight loss, diet, and exercise Recommended diet heavy in fruits and veggies and low in animal meats, cheeses, and dairy products, appropriate calorie intake Discussed ideal weight for height  Will follow up in 3 months  Vitamin D Def At goal at last visit; continue supplementation to maintain goal of 60-100 Defer Vit D level  Left knee Pain Has surgery for meniscus tear of left knee next week Continue to follow with orthopedics  Medication Management - Magnesium  Continue diet and meds as discussed. Further disposition pending results of labs. Discussed med's effects and SE's.   Over 30 minutes of exam, counseling, chart review, and critical decision making was performed.   No future appointments.   ----------------------------------------------------------------------------------------------------------------------  HPI 66 y.o. male  presents for 3 month follow up on hypertension, cholesterol, prediabetes, weight and vitamin D deficiency.   He continues to have left knee pain and is to have meniscus repair surgery next week with Dr. Eulah Pont. Had cortisone injection 3 weeks ago which has helped with pain somewhat.   BMI  is Body mass index is 25.96 kg/m., he has been working on diet and exercise, walking regularly and mowing.  Wt Readings from Last 3 Encounters:  11/09/21 175 lb 12.8 oz (79.7 kg)  10/17/20 170 lb 3.2 oz (77.2 kg)  08/29/20 175 lb 12.8 oz (79.7 kg)   His blood pressure has been controlled at home, today their BP is BP: 118/72 BP Readings from Last 3 Encounters:  11/09/21 118/72  10/17/20 140/80  08/29/20 130/82     He does workout. He denies chest pain, shortness of breath, dizziness.   He is on cholesterol medication Simvastatin 40 mg and denies myalgias. His cholesterol is at goal. The cholesterol last visit was:   Lab Results  Component Value Date   CHOL 152 08/29/2020   HDL 43 08/29/2020   LDLCALC 92 08/29/2020   TRIG 76 08/29/2020   CHOLHDL 3.5 08/29/2020    He has been working on diet and exercise for hx of prediabetes (predates 2015, A1C in 12/2019 was 6.1%), and denies increased appetite, nausea, paresthesia of the feet, polydipsia, polyuria and visual disturbances. Last A1C in the office was:  Lab Results  Component Value Date   HGBA1C 6.0 (H) 08/29/2020   Patient is on Vitamin D supplement.   Lab Results  Component Value Date   VD25OH 65 08/29/2020        Current Medications:  Current Outpatient Medications on File Prior to Visit  Medication Sig   Cholecalciferol 5000 units capsule Take 1 capsule (5,000 Units total) by mouth daily.   simvastatin (ZOCOR) 40 MG tablet TAKE 1 TABLET BY MOUTH EVERY DAY AT BEDTIME FOR CHOLESTEROL   valsartan (DIOVAN) 80 MG tablet Take  1  tablet  Daily for BP  / Patient knows to take by mouth   No current facility-administered medications on file prior to visit.     Allergies: No Known Allergies   Medical History:  Past Medical History:  Diagnosis Date   Heart murmur    Hyperlipidemia    Hypertension    Shingles    Vitamin D deficiency    Family history- Reviewed and unchanged Social history- Reviewed and  unchanged   Review of Systems:  Review of Systems  Constitutional:  Negative for malaise/fatigue and weight loss.  HENT:  Negative for hearing loss and tinnitus.   Eyes:  Negative for blurred vision and double vision.  Respiratory:  Negative for cough, shortness of breath and wheezing.   Cardiovascular:  Negative for chest pain, palpitations, orthopnea, claudication and leg swelling.  Gastrointestinal:  Negative for abdominal pain, blood in stool, constipation, diarrhea, heartburn, melena, nausea and vomiting.  Genitourinary: Negative.   Musculoskeletal:  Positive for joint pain (left knee). Negative for myalgias.  Skin:  Negative for rash.  Neurological:  Negative for dizziness, tingling, sensory change, weakness and headaches.  Endo/Heme/Allergies:  Negative for polydipsia.  Psychiatric/Behavioral: Negative.    All other systems reviewed and are negative.    Physical Exam: BP 118/72    Pulse 82    Temp (!) 97.5 F (36.4 C)    Wt 175 lb 12.8 oz (79.7 kg)    SpO2 96%    BMI 25.96 kg/m  Wt Readings from Last 3 Encounters:  11/09/21 175 lb 12.8 oz (79.7 kg)  10/17/20 170 lb 3.2 oz (77.2 kg)  08/29/20 175 lb 12.8 oz (79.7 kg)   General Appearance: Well nourished, in no apparent distress. Eyes: PERRLA, EOMs, conjunctiva no swelling or erythema Sinuses: No Frontal/maxillary tenderness ENT/Mouth: Ext aud canals clear, TMs without erythema, bulging. No erythema, swelling, or exudate on post pharynx.  Tonsils not swollen or erythematous. Hearing normal.  Neck: Supple, thyroid normal.  Respiratory: Respiratory effort normal, BS equal bilaterally without rales, rhonchi, wheezing or stridor.  Cardio: RRR with no MRGs. Brisk peripheral pulses without edema.  Abdomen: Soft, + BS.  Non tender, no guarding, rebound, hernias, masses. Lymphatics: Non tender without lymphadenopathy.  Musculoskeletal: Full ROM , 5/5 strength, Mildly antalgic gait from left knee pain Skin: Warm, dry without  rashes, lesions, ecchymosis.  Neuro: Cranial nerves intact. No cerebellar symptoms.  Psych: Awake and oriented X 3, normal affect, Insight and Judgment appropriate.    Revonda Humphrey, NP 10:19 AM [facility]

## 2021-11-09 ENCOUNTER — Other Ambulatory Visit: Payer: Self-pay

## 2021-11-09 ENCOUNTER — Encounter: Payer: Self-pay | Admitting: Nurse Practitioner

## 2021-11-09 ENCOUNTER — Ambulatory Visit (INDEPENDENT_AMBULATORY_CARE_PROVIDER_SITE_OTHER): Payer: BC Managed Care – PPO | Admitting: Nurse Practitioner

## 2021-11-09 VITALS — BP 118/72 | HR 82 | Temp 97.5°F | Wt 175.8 lb

## 2021-11-09 DIAGNOSIS — I700 Atherosclerosis of aorta: Secondary | ICD-10-CM

## 2021-11-09 DIAGNOSIS — E559 Vitamin D deficiency, unspecified: Secondary | ICD-10-CM

## 2021-11-09 DIAGNOSIS — R7309 Other abnormal glucose: Secondary | ICD-10-CM | POA: Diagnosis not present

## 2021-11-09 DIAGNOSIS — Z79899 Other long term (current) drug therapy: Secondary | ICD-10-CM

## 2021-11-09 DIAGNOSIS — E782 Mixed hyperlipidemia: Secondary | ICD-10-CM | POA: Diagnosis not present

## 2021-11-09 DIAGNOSIS — I10 Essential (primary) hypertension: Secondary | ICD-10-CM

## 2021-11-09 DIAGNOSIS — M25562 Pain in left knee: Secondary | ICD-10-CM

## 2021-11-09 DIAGNOSIS — Z6825 Body mass index (BMI) 25.0-25.9, adult: Secondary | ICD-10-CM

## 2021-11-10 LAB — COMPLETE METABOLIC PANEL WITH GFR
AG Ratio: 1.6 (calc) (ref 1.0–2.5)
ALT: 9 U/L (ref 9–46)
AST: 14 U/L (ref 10–35)
Albumin: 4.5 g/dL (ref 3.6–5.1)
Alkaline phosphatase (APISO): 66 U/L (ref 35–144)
BUN: 20 mg/dL (ref 7–25)
CO2: 31 mmol/L (ref 20–32)
Calcium: 9.7 mg/dL (ref 8.6–10.3)
Chloride: 105 mmol/L (ref 98–110)
Creat: 0.76 mg/dL (ref 0.70–1.35)
Globulin: 2.8 g/dL (calc) (ref 1.9–3.7)
Glucose, Bld: 71 mg/dL (ref 65–99)
Potassium: 4.8 mmol/L (ref 3.5–5.3)
Sodium: 142 mmol/L (ref 135–146)
Total Bilirubin: 0.4 mg/dL (ref 0.2–1.2)
Total Protein: 7.3 g/dL (ref 6.1–8.1)
eGFR: 99 mL/min/{1.73_m2} (ref >=60)

## 2021-11-10 LAB — LIPID PANEL
Cholesterol: 168 mg/dL (ref <200)
HDL: 47 mg/dL (ref >=40)
LDL Cholesterol (Calc): 103 mg/dL (calc) — ABNORMAL HIGH
Non-HDL Cholesterol (Calc): 121 mg/dL (calc) (ref <130)
Total CHOL/HDL Ratio: 3.6 (calc) (ref <5.0)
Triglycerides: 87 mg/dL (ref <150)

## 2021-11-10 LAB — CBC WITH DIFFERENTIAL/PLATELET
Absolute Monocytes: 686 cells/uL (ref 200–950)
Basophils Absolute: 70 cells/uL (ref 0–200)
Basophils Relative: 1 %
Eosinophils Absolute: 252 cells/uL (ref 15–500)
Eosinophils Relative: 3.6 %
HCT: 41.4 % (ref 38.5–50.0)
Hemoglobin: 13.9 g/dL (ref 13.2–17.1)
Lymphs Abs: 1925 cells/uL (ref 850–3900)
MCH: 29.5 pg (ref 27.0–33.0)
MCHC: 33.6 g/dL (ref 32.0–36.0)
MCV: 87.9 fL (ref 80.0–100.0)
MPV: 11.2 fL (ref 7.5–12.5)
Monocytes Relative: 9.8 %
Neutro Abs: 4067 cells/uL (ref 1500–7800)
Neutrophils Relative %: 58.1 %
Platelets: 268 10*3/uL (ref 140–400)
RBC: 4.71 10*6/uL (ref 4.20–5.80)
RDW: 12.8 % (ref 11.0–15.0)
Total Lymphocyte: 27.5 %
WBC: 7 10*3/uL (ref 3.8–10.8)

## 2021-11-10 LAB — HEMOGLOBIN A1C
Hgb A1c MFr Bld: 6.1 % of total Hgb — ABNORMAL HIGH (ref <5.7)
Mean Plasma Glucose: 128 mg/dL
eAG (mmol/L): 7.1 mmol/L

## 2021-11-10 LAB — TSH: TSH: 1.96 mIU/L (ref 0.40–4.50)

## 2021-11-10 LAB — MAGNESIUM: Magnesium: 2 mg/dL (ref 1.5–2.5)

## 2021-11-15 DIAGNOSIS — M94262 Chondromalacia, left knee: Secondary | ICD-10-CM | POA: Diagnosis not present

## 2021-11-15 DIAGNOSIS — Y999 Unspecified external cause status: Secondary | ICD-10-CM | POA: Diagnosis not present

## 2021-11-15 DIAGNOSIS — X58XXXA Exposure to other specified factors, initial encounter: Secondary | ICD-10-CM | POA: Diagnosis not present

## 2021-11-15 DIAGNOSIS — S83242A Other tear of medial meniscus, current injury, left knee, initial encounter: Secondary | ICD-10-CM | POA: Diagnosis not present

## 2021-11-15 DIAGNOSIS — G8918 Other acute postprocedural pain: Secondary | ICD-10-CM | POA: Diagnosis not present

## 2021-11-15 DIAGNOSIS — S83232A Complex tear of medial meniscus, current injury, left knee, initial encounter: Secondary | ICD-10-CM | POA: Diagnosis not present

## 2021-11-19 DIAGNOSIS — R269 Unspecified abnormalities of gait and mobility: Secondary | ICD-10-CM | POA: Diagnosis not present

## 2021-11-19 DIAGNOSIS — M25662 Stiffness of left knee, not elsewhere classified: Secondary | ICD-10-CM | POA: Diagnosis not present

## 2021-11-19 DIAGNOSIS — M6281 Muscle weakness (generalized): Secondary | ICD-10-CM | POA: Diagnosis not present

## 2021-11-19 DIAGNOSIS — S83242D Other tear of medial meniscus, current injury, left knee, subsequent encounter: Secondary | ICD-10-CM | POA: Diagnosis not present

## 2021-11-21 DIAGNOSIS — M6281 Muscle weakness (generalized): Secondary | ICD-10-CM | POA: Diagnosis not present

## 2021-11-21 DIAGNOSIS — M25662 Stiffness of left knee, not elsewhere classified: Secondary | ICD-10-CM | POA: Diagnosis not present

## 2021-11-21 DIAGNOSIS — R269 Unspecified abnormalities of gait and mobility: Secondary | ICD-10-CM | POA: Diagnosis not present

## 2021-11-21 DIAGNOSIS — S83242D Other tear of medial meniscus, current injury, left knee, subsequent encounter: Secondary | ICD-10-CM | POA: Diagnosis not present

## 2021-11-26 DIAGNOSIS — R269 Unspecified abnormalities of gait and mobility: Secondary | ICD-10-CM | POA: Diagnosis not present

## 2021-11-26 DIAGNOSIS — S83242D Other tear of medial meniscus, current injury, left knee, subsequent encounter: Secondary | ICD-10-CM | POA: Diagnosis not present

## 2021-11-26 DIAGNOSIS — M25662 Stiffness of left knee, not elsewhere classified: Secondary | ICD-10-CM | POA: Diagnosis not present

## 2021-11-26 DIAGNOSIS — M94262 Chondromalacia, left knee: Secondary | ICD-10-CM | POA: Diagnosis not present

## 2021-11-26 DIAGNOSIS — M6281 Muscle weakness (generalized): Secondary | ICD-10-CM | POA: Diagnosis not present

## 2021-11-30 ENCOUNTER — Other Ambulatory Visit: Payer: Self-pay | Admitting: Adult Health

## 2021-11-30 DIAGNOSIS — M25662 Stiffness of left knee, not elsewhere classified: Secondary | ICD-10-CM | POA: Diagnosis not present

## 2021-11-30 DIAGNOSIS — R269 Unspecified abnormalities of gait and mobility: Secondary | ICD-10-CM | POA: Diagnosis not present

## 2021-11-30 DIAGNOSIS — S83242D Other tear of medial meniscus, current injury, left knee, subsequent encounter: Secondary | ICD-10-CM | POA: Diagnosis not present

## 2021-11-30 DIAGNOSIS — M6281 Muscle weakness (generalized): Secondary | ICD-10-CM | POA: Diagnosis not present

## 2021-12-03 DIAGNOSIS — M6281 Muscle weakness (generalized): Secondary | ICD-10-CM | POA: Diagnosis not present

## 2021-12-03 DIAGNOSIS — R269 Unspecified abnormalities of gait and mobility: Secondary | ICD-10-CM | POA: Diagnosis not present

## 2021-12-03 DIAGNOSIS — S83242D Other tear of medial meniscus, current injury, left knee, subsequent encounter: Secondary | ICD-10-CM | POA: Diagnosis not present

## 2021-12-03 DIAGNOSIS — M25662 Stiffness of left knee, not elsewhere classified: Secondary | ICD-10-CM | POA: Diagnosis not present

## 2021-12-05 DIAGNOSIS — M6281 Muscle weakness (generalized): Secondary | ICD-10-CM | POA: Diagnosis not present

## 2021-12-05 DIAGNOSIS — R269 Unspecified abnormalities of gait and mobility: Secondary | ICD-10-CM | POA: Diagnosis not present

## 2021-12-05 DIAGNOSIS — M25662 Stiffness of left knee, not elsewhere classified: Secondary | ICD-10-CM | POA: Diagnosis not present

## 2021-12-05 DIAGNOSIS — S83242D Other tear of medial meniscus, current injury, left knee, subsequent encounter: Secondary | ICD-10-CM | POA: Diagnosis not present

## 2022-01-21 ENCOUNTER — Encounter: Payer: BC Managed Care – PPO | Admitting: Internal Medicine

## 2022-01-23 NOTE — Progress Notes (Signed)
? ?Annual  Screening/Preventative Visit  ?& Comprehensive Evaluation & Examination ? ?Future Appointments  ?Date Time Provider Department  ?01/24/2022 11:00 AM [name], MD [facility]  ?01/28/2023 11:00 AM [name], MD [facility]  ? ?    ?     This very nice 66 y.o. DWM presents for a Screening /Preventative Visit & comprehensive evaluation and management of multiple medical co-morbidities.  Patient has been followed for HTN, HLD, Prediabetes and Vitamin D Deficiency. Patient dx'd OSA in Apr 2019  on CPAP & is followed by Dr Frances Furbish. Abd CT scan Jan 2022 showed Aortic Atherosclerosis.  ? ? ?    HTN predates since  2004. Patient's BP has been controlled at home.  Today's BP is at goal - 138/84. Patient denies any cardiac symptoms as chest pain, palpitations, shortness of breath, dizziness or ankle swelling. ? ? ?    Patient's hyperlipidemia is controlled with diet and Simvastatin. Patient denies myalgias or other medication SE's. Last lipids were at goal : ? ?Lab Results  ?Component Value Date  ? CHOL 168 11/09/2021  ? HDL 47 11/09/2021  ? LDLCALC 103 (H) 11/09/2021  ? TRIG 87 11/09/2021  ? CHOLHDL 3.6 11/09/2021  ? ? ? ?    Patient has hx/o prediabetes (A1c 6.0% /2015) and patient denies reactive hypoglycemic symptoms, visual blurring, diabetic polys or paresthesias. Last A1c was not at goal : ?  ?Lab Results  ?Component Value Date  ? HGBA1C 6.1 (H) 11/09/2021  ?  ? ? ?    Finally, patient has history of Vitamin D Deficiency ("35" /2009) and last vitamin D was at goal : ?  ?Lab Results  ?Component Value Date  ? VD25OH 65 08/29/2020  ? ? ? ?Current Outpatient Medications on File Prior to Visit  ?Medication Sig  ? Cholecalciferol 5000 units capsule Take 1 capsule daily.  ? simvastatin 40 MG tablet TAKE 1 TABLET EVERY DAY  ? valsartan 80 MG tablet Take  1 tablet  Daily  ? ? ?No Known Allergies ? ? ?Past Medical History:  ?Diagnosis Date  ? Heart murmur   ? Hyperlipidemia   ? Hypertension   ? Shingles   ?  Vitamin D deficiency   ? ? ? ?Health Maintenance  ?Topic Date Due  ? Hepatitis C Screening  Never done  ? Zoster Vaccines- Shingrix (1 of 2) Never done  ? TETANUS/TDAP  10/19/2018  ? COVID-19 Vaccine (4 - Booster for Pfizer series) 09/08/2020  ? INFLUENZA VACCINE  05/14/2022  ? Pneumonia Vaccine 44+ Years old  Completed  ? HPV VACCINES  Aged Out  ? ? ? ?Immunization History  ?Administered Date(s) Administered  ? Influenza Inj Mdck Quad  09/02/2017  ? PFIZER SARS-COV-2 Vacc 11/15/2019, 12/13/2019, 07/14/2020  ? PPD Test 09/02/2017  ? Pneumococcal -13 01/18/2019  ? Pneumococcal -23 02/17/2020  ? Tdap 10/19/2008  ? ? ?Last Colon - 11/07/2020 - Dr Kinnie Scales - Recc 10 year f/u due Feb 2032 ? ? ?Past Surgical History:  ?Procedure Laterality Date  ? ARTHROSCOPY WITH ANTERIOR CRUCIATE LIGAMENT (ACL) REPAIR WITH ANTERIOR TIBILIAS GRAFT Right   ? INGUINAL HERNIA REPAIR Bilateral 09/28/2015  ? Procedure: LAPAROSCOPIC BILATERAL INGUINAL HERNIA REPAIR;  Surgeon: Gaynelle Adu, MD;  Location: [facility];  Service: General;  Laterality: Bilateral;  ? INSERTION OF MESH Bilateral 09/28/2015  ? Procedure: INSERTION OF MESH;  Surgeon: Gaynelle Adu, MD;  Location: [facility];  Service: General;  Laterality: Bilateral;  ? SHOULDER ARTHROSCOPY    ? TOTAL  KNEE ARTHROPLASTY Right 01/11/2020  ? Procedure: TOTAL KNEE ARTHROPLASTY;  Surgeon: Sheral ApleyMurphy, Timothy D, MD;  Location: [facility];  Service: Orthopedics;  Laterality: Right;  ? ? ? ?Family History  ?Problem Relation Age of Onset  ? Cancer Mother   ?     colon, lung  ? Heart disease Father   ? Hyperlipidemia Father   ? Heart attack Father   ? ? ? ?Social History  ? ?Tobacco Use  ? Smoking status: Never  ? Smokeless tobacco: Never  ?Vaping Use  ? Vaping Use: Never used  ?Substance Use Topics  ? Alcohol use: Yes  ?  Comment: OCCASIONAL BEER   ? Drug use: No  ? ? ? ? ROS ?Constitutional: Denies fever, chills, weight loss/gain, headaches, insomnia,  night sweats or change in appetite. Does c/o fatigue. ?Eyes:  Denies redness, blurred vision, diplopia, discharge, itchy or watery eyes.  ?ENT: Denies discharge, congestion, post nasal drip, epistaxis, sore throat, earache, hearing loss, dental pain, Tinnitus, Vertigo, Sinus pain or snoring.  ?Cardio: Denies chest pain, palpitations, irregular heartbeat, syncope, dyspnea, diaphoresis, orthopnea, PND, claudication or edema ?Respiratory: denies cough, dyspnea, DOE, pleurisy, hoarseness, laryngitis or wheezing.  ?Gastrointestinal: Denies dysphagia, heartburn, reflux, water brash, pain, cramps, nausea, vomiting, bloating, diarrhea, constipation, hematemesis, melena, hematochezia, jaundice or hemorrhoids ?Genitourinary: Denies dysuria, frequency, urgency, nocturia, hesitancy, discharge, hematuria or flank pain ?Musculoskeletal: Denies arthralgia, myalgia, stiffness, Jt. Swelling, pain, limp or strain/sprain. Denies Falls. ?Skin: Denies puritis, rash, hives, warts, acne, eczema or change in skin lesion ?Neuro: No weakness, tremor, incoordination, spasms, paresthesia or pain ?Psychiatric: Denies confusion, memory loss or sensory loss. Denies Depression. ?Endocrine: Denies change in weight, skin, hair change, nocturia, and paresthesia, diabetic polys, visual blurring or hyper / hypo glycemic episodes.  ?Heme/Lymph: No excessive bleeding, bruising or enlarged lymph nodes. ? ? ?Physical Exam ? ?BP 138/84   Pulse 91   Temp 97.9 ?F (36.6 ?C)   Resp 16   Ht 5\' 9"  (1.753 m)   Wt 176 lb 6.4 oz (80 kg)   SpO2 96%   BMI 26.05 kg/m?  ? ?General Appearance: Well nourished and well groomed and in no apparent distress. ? ?Eyes: PERRLA, EOMs, conjunctiva no swelling or erythema, normal fundi and vessels. ?Sinuses: No frontal/maxillary tenderness ?ENT/Mouth: EACs patent / TMs  nl. Nares clear without erythema, swelling, mucoid exudates. Oral hygiene is good. No erythema, swelling, or exudate. Tongue normal, non-obstructing. Tonsils not swollen or erythematous. Hearing normal.  ?Neck: Supple,  thyroid not palpable. No bruits, nodes or JVD. ?Respiratory: Respiratory effort normal.  BS equal and clear bilateral without rales, rhonci, wheezing or stridor. ?Cardio: Heart sounds are normal with regular rate and rhythm and no murmurs, rubs or gallops. Peripheral pulses are normal and equal bilaterally without edema. No aortic or femoral bruits. ?Chest: symmetric with normal excursions and percussion.  ?Abdomen: Soft, with Nl bowel sounds. Nontender, no guarding, rebound, hernias, masses, or organomegaly.  ?Lymphatics: Non tender without lymphadenopathy.  ?Musculoskeletal: Full ROM all peripheral extremities, joint stability, 5/5 strength, and normal gait. ?Skin: Warm and dry without rashes, lesions, cyanosis, clubbing or  ecchymosis.  ?Neuro: Cranial nerves intact, reflexes equal bilaterally. Normal muscle tone, no cerebellar symptoms. Sensation intact.  ?Pysch: Alert and oriented X 3 with normal affect, insight and judgment appropriate.  ? ?Assessment and Plan ? ?1. Annual Preventative/Screening Exam  ? ? ?2. Essential hypertension ? ?- EKG 12-Lead ?- US, RETROPERITNL ABD,  LTD ?- Urinalysis, Routine w reflex microscopic ?- Microalbumin / creatinine  urine ratio ?- CBC with Differential/Platelet ?- COMPLETE METABOLIC PANEL WITH GFR ?- Magnesium ?- TSH ? ?3. Hyperlipidemia, mixed ? ?- EKG 12-Lead ?- [place], RETROPERITNL ABD,  LTD ?- Lipid panel ?- TSH ? ?4. Abnormal glucose ? ?- EKG 12-Lead ?- [place], RETROPERITNL ABD,  LTD ?- Hemoglobin A1c ?- Insulin, random ? ?5. Vitamin D deficiency ? ?- VITAMIN D 25 Hydroxy  ? ?6. Aortic atherosclerosis (HCC) by Abd CTscan on 11/03/2020 ? ?- EKG 12-Lead ?- [place], RETROPERITNL ABD,  LTD ?- Lipid panel ? ?7. BPH with obstruction/lower urinary tract symptoms ? ?- PSA ? ?8. OSA (obstructive sleep apnea) ? ? ?9. Prostate cancer screening ? ?- PSA ? ?10. Screening for colorectal cancer ? ?- POC Hemoccult Bld/Stl  ? ?11. Screening for heart disease ? ?- EKG 12-Lead ? ?12. FHx: heart  disease ? ?- EKG 12-Lead ?- [place], RETROPERITNL ABD,  LTD ? ?13. Screening for AAA (aortic abdominal aneurysm) ? ?- [place], RETROPERITNL ABD,  LTD ? ?14. Medication management ? ?- Urinalysis, Routine w reflex microscopic ?-

## 2022-01-24 ENCOUNTER — Encounter: Payer: Self-pay | Admitting: Internal Medicine

## 2022-01-24 ENCOUNTER — Ambulatory Visit (INDEPENDENT_AMBULATORY_CARE_PROVIDER_SITE_OTHER): Payer: BC Managed Care – PPO | Admitting: Internal Medicine

## 2022-01-24 VITALS — BP 138/84 | HR 91 | Temp 97.9°F | Resp 16 | Ht 69.0 in | Wt 176.4 lb

## 2022-01-24 DIAGNOSIS — N401 Enlarged prostate with lower urinary tract symptoms: Secondary | ICD-10-CM

## 2022-01-24 DIAGNOSIS — G4733 Obstructive sleep apnea (adult) (pediatric): Secondary | ICD-10-CM

## 2022-01-24 DIAGNOSIS — R350 Frequency of micturition: Secondary | ICD-10-CM

## 2022-01-24 DIAGNOSIS — Z136 Encounter for screening for cardiovascular disorders: Secondary | ICD-10-CM | POA: Diagnosis not present

## 2022-01-24 DIAGNOSIS — Z8249 Family history of ischemic heart disease and other diseases of the circulatory system: Secondary | ICD-10-CM

## 2022-01-24 DIAGNOSIS — Z79899 Other long term (current) drug therapy: Secondary | ICD-10-CM

## 2022-01-24 DIAGNOSIS — Z0000 Encounter for general adult medical examination without abnormal findings: Secondary | ICD-10-CM

## 2022-01-24 DIAGNOSIS — Z1211 Encounter for screening for malignant neoplasm of colon: Secondary | ICD-10-CM

## 2022-01-24 DIAGNOSIS — I700 Atherosclerosis of aorta: Secondary | ICD-10-CM | POA: Diagnosis not present

## 2022-01-24 DIAGNOSIS — Z131 Encounter for screening for diabetes mellitus: Secondary | ICD-10-CM | POA: Diagnosis not present

## 2022-01-24 DIAGNOSIS — Z125 Encounter for screening for malignant neoplasm of prostate: Secondary | ICD-10-CM | POA: Diagnosis not present

## 2022-01-24 DIAGNOSIS — Z0001 Encounter for general adult medical examination with abnormal findings: Secondary | ICD-10-CM

## 2022-01-24 DIAGNOSIS — I10 Essential (primary) hypertension: Secondary | ICD-10-CM | POA: Diagnosis not present

## 2022-01-24 DIAGNOSIS — N138 Other obstructive and reflux uropathy: Secondary | ICD-10-CM

## 2022-01-24 DIAGNOSIS — Z1389 Encounter for screening for other disorder: Secondary | ICD-10-CM | POA: Diagnosis not present

## 2022-01-24 DIAGNOSIS — Z13220 Encounter for screening for lipoid disorders: Secondary | ICD-10-CM

## 2022-01-24 DIAGNOSIS — E559 Vitamin D deficiency, unspecified: Secondary | ICD-10-CM

## 2022-01-24 DIAGNOSIS — E782 Mixed hyperlipidemia: Secondary | ICD-10-CM

## 2022-01-24 DIAGNOSIS — R7309 Other abnormal glucose: Secondary | ICD-10-CM

## 2022-01-24 NOTE — Patient Instructions (Signed)

## 2022-01-25 ENCOUNTER — Other Ambulatory Visit: Payer: Self-pay | Admitting: Internal Medicine

## 2022-01-25 DIAGNOSIS — E782 Mixed hyperlipidemia: Secondary | ICD-10-CM

## 2022-01-25 DIAGNOSIS — M94262 Chondromalacia, left knee: Secondary | ICD-10-CM | POA: Diagnosis not present

## 2022-01-25 LAB — LIPID PANEL
Cholesterol: 175 mg/dL (ref <200)
HDL: 49 mg/dL (ref >=40)
LDL Cholesterol (Calc): 107 mg/dL (calc) — ABNORMAL HIGH
Non-HDL Cholesterol (Calc): 126 mg/dL (calc) (ref <130)
Total CHOL/HDL Ratio: 3.6 (calc) (ref <5.0)
Triglycerides: 96 mg/dL (ref <150)

## 2022-01-25 LAB — COMPLETE METABOLIC PANEL WITH GFR
AG Ratio: 1.6 (calc) (ref 1.0–2.5)
ALT: 12 U/L (ref 9–46)
AST: 13 U/L (ref 10–35)
Albumin: 4.2 g/dL (ref 3.6–5.1)
Alkaline phosphatase (APISO): 62 U/L (ref 35–144)
BUN: 14 mg/dL (ref 7–25)
CO2: 26 mmol/L (ref 20–32)
Calcium: 9.9 mg/dL (ref 8.6–10.3)
Chloride: 106 mmol/L (ref 98–110)
Creat: 0.78 mg/dL (ref 0.70–1.35)
Globulin: 2.6 g/dL (calc) (ref 1.9–3.7)
Glucose, Bld: 104 mg/dL — ABNORMAL HIGH (ref 65–99)
Potassium: 4.2 mmol/L (ref 3.5–5.3)
Sodium: 141 mmol/L (ref 135–146)
Total Bilirubin: 0.5 mg/dL (ref 0.2–1.2)
Total Protein: 6.8 g/dL (ref 6.1–8.1)
eGFR: 97 mL/min/{1.73_m2} (ref >=60)

## 2022-01-25 LAB — CBC WITH DIFFERENTIAL/PLATELET
Absolute Monocytes: 568 cells/uL (ref 200–950)
Basophils Absolute: 50 cells/uL (ref 0–200)
Basophils Relative: 0.7 %
Eosinophils Absolute: 220 cells/uL (ref 15–500)
Eosinophils Relative: 3.1 %
HCT: 40.4 % (ref 38.5–50.0)
Hemoglobin: 13.3 g/dL (ref 13.2–17.1)
Lymphs Abs: 2002 cells/uL (ref 850–3900)
MCH: 29.2 pg (ref 27.0–33.0)
MCHC: 32.9 g/dL (ref 32.0–36.0)
MCV: 88.6 fL (ref 80.0–100.0)
MPV: 10.9 fL (ref 7.5–12.5)
Monocytes Relative: 8 %
Neutro Abs: 4260 cells/uL (ref 1500–7800)
Neutrophils Relative %: 60 %
Platelets: 250 10*3/uL (ref 140–400)
RBC: 4.56 10*6/uL (ref 4.20–5.80)
RDW: 12.6 % (ref 11.0–15.0)
Total Lymphocyte: 28.2 %
WBC: 7.1 10*3/uL (ref 3.8–10.8)

## 2022-01-25 LAB — MICROALBUMIN / CREATININE URINE RATIO
Creatinine, Urine: 109 mg/dL (ref 20–320)
Microalb Creat Ratio: 19 mcg/mg creat (ref <30)
Microalb, Ur: 2.1 mg/dL

## 2022-01-25 LAB — URINALYSIS, ROUTINE W REFLEX MICROSCOPIC
Bilirubin Urine: NEGATIVE
Glucose, UA: NEGATIVE
Hgb urine dipstick: NEGATIVE
Ketones, ur: NEGATIVE
Leukocytes,Ua: NEGATIVE
Nitrite: NEGATIVE
Protein, ur: NEGATIVE
Specific Gravity, Urine: 1.016 (ref 1.001–1.035)
pH: 7 (ref 5.0–8.0)

## 2022-01-25 LAB — HEMOGLOBIN A1C
Hgb A1c MFr Bld: 5.9 % of total Hgb — ABNORMAL HIGH (ref <5.7)
Mean Plasma Glucose: 123 mg/dL
eAG (mmol/L): 6.8 mmol/L

## 2022-01-25 LAB — INSULIN, RANDOM: Insulin: 25.8 u[IU]/mL — ABNORMAL HIGH

## 2022-01-25 LAB — TSH: TSH: 2.89 mIU/L (ref 0.40–4.50)

## 2022-01-25 LAB — MAGNESIUM: Magnesium: 1.9 mg/dL (ref 1.5–2.5)

## 2022-01-25 LAB — PSA: PSA: 3.11 ng/mL (ref <=4.00)

## 2022-01-25 LAB — VITAMIN D 25 HYDROXY (VIT D DEFICIENCY, FRACTURES): Vit D, 25-Hydroxy: 60 ng/mL (ref 30–100)

## 2022-01-25 MED ORDER — ROSUVASTATIN CALCIUM 20 MG PO TABS: ORAL_TABLET | ORAL | 3 refills | Status: DC

## 2022-01-25 NOTE — Progress Notes (Signed)
<><><><><><><><><><><><><><><><><><><><><><><><><><><><><><><><><> ?<><><><><><><><><><><><><><><><><><><><><><><><><><><><><><><><><> ?-   Test results slightly outside the reference range are not unusual. ?If there is anything important, I will review this with you,  ?otherwise it is considered normal test values.  ?If you have further questions,  ?please do not hesitate to contact me at the office or via My Chart.  ?<><><><><><><><><><><><><><><><><><><><><><><><><><><><><><><><><> ? ?-  Total Chol = 175 is [place] , But . . . . . . . ? ?- Bad / Dangerous LDL Chol = 107  ?           (  Ideal or Goal  is less than 70  !  )  ? ?- So,  Sent in [place] Rx for  ? ?- Rosuvastatin / Crestor    to replace the Simvastatin ?<><><><><><><><><><><><><><><><><><><><><><><><><><><><><><><><><> ? ?-  A1c = 5.9%  -  A1c is  STILL  elevated in the borderline and  ?                                                      early or pre-diabetes range which has the same  ? ?300% increased risk for heart attack, stroke, cancer and  ?                                        alzheimer- type vascular dementia as full blown diabetes.  ? ?But the good news is that diet, exercise with  ?                                                 weight loss can cure the early diabetes at this point. ?<><><><><><><><><><><><><><><><><><><><><><><><><><><><><><><><><> ? ?-  PSA - Low   Great   ! ?<><><><><><><><><><><><><><><><><><><><><><><><><><><><><><><><><> ? ?-  Vitamin D = 60   - Great   - Please keep dose same  ?<><><><><><><><><><><><><><><><><><><><><><><><><><><><><><><><><> ? ?-  All Else - CBC - Kidneys - Electrolytes - Liver - Magnesium & Thyroid   ? ?- all  Normal / OK ?<><><><><><><><><><><><><><><><><><><><><><><><><><><><><><><><><> ?<><><><><><><><><><><><><><><><><><><><><><><><><><><><><><><><><> ? ? ? ? ? ? ? ? ? ? ? ? ? ? ? ? ? ? ? ? ? ?

## 2022-01-26 ENCOUNTER — Encounter: Payer: Self-pay | Admitting: Internal Medicine

## 2022-02-02 ENCOUNTER — Other Ambulatory Visit: Payer: Self-pay | Admitting: Internal Medicine

## 2022-03-03 ENCOUNTER — Other Ambulatory Visit: Payer: Self-pay | Admitting: Adult Health

## 2022-04-24 DIAGNOSIS — M94262 Chondromalacia, left knee: Secondary | ICD-10-CM | POA: Diagnosis not present

## 2022-04-25 ENCOUNTER — Encounter: Payer: Self-pay | Admitting: Nurse Practitioner

## 2022-04-25 ENCOUNTER — Ambulatory Visit (INDEPENDENT_AMBULATORY_CARE_PROVIDER_SITE_OTHER): Payer: BC Managed Care – PPO | Admitting: Nurse Practitioner

## 2022-04-25 VITALS — BP 138/80 | HR 79 | Temp 97.2°F | Ht 69.0 in | Wt 174.0 lb

## 2022-04-25 DIAGNOSIS — M25562 Pain in left knee: Secondary | ICD-10-CM

## 2022-04-25 DIAGNOSIS — G4733 Obstructive sleep apnea (adult) (pediatric): Secondary | ICD-10-CM

## 2022-04-25 DIAGNOSIS — M1711 Unilateral primary osteoarthritis, right knee: Secondary | ICD-10-CM

## 2022-04-25 DIAGNOSIS — I10 Essential (primary) hypertension: Secondary | ICD-10-CM | POA: Diagnosis not present

## 2022-04-25 DIAGNOSIS — E559 Vitamin D deficiency, unspecified: Secondary | ICD-10-CM

## 2022-04-25 DIAGNOSIS — I700 Atherosclerosis of aorta: Secondary | ICD-10-CM

## 2022-04-25 DIAGNOSIS — E782 Mixed hyperlipidemia: Secondary | ICD-10-CM | POA: Diagnosis not present

## 2022-04-25 DIAGNOSIS — N138 Other obstructive and reflux uropathy: Secondary | ICD-10-CM

## 2022-04-25 DIAGNOSIS — Z79899 Other long term (current) drug therapy: Secondary | ICD-10-CM

## 2022-04-25 DIAGNOSIS — K219 Gastro-esophageal reflux disease without esophagitis: Secondary | ICD-10-CM

## 2022-04-25 DIAGNOSIS — N401 Enlarged prostate with lower urinary tract symptoms: Secondary | ICD-10-CM

## 2022-04-25 DIAGNOSIS — R7303 Prediabetes: Secondary | ICD-10-CM

## 2022-04-25 NOTE — Progress Notes (Signed)
FOLLOW UP  Assessment and Plan:   1. Essential hypertension Controlled  Discussed DASH (Dietary Approaches to Stop Hypertension) DASH diet is lower in sodium than a typical American diet. Cut back on foods that are high in saturated fat, cholesterol, and trans fats. Eat more whole-grain foods, fish, poultry, and nuts Remain active and exercise as tolerated daily.  Monitor BP at home-Call if greater than 130/80.   - CBC with Differential/Platelet - COMPLETE METABOLIC PANEL WITH GFR  2. Aortic atherosclerosis (HCC) by Abd CTscan on 11/03/2020 Continue medications; Rosuvastatin Discussed lifestyle modifications. Recommended diet heavy in fruits and veggies, omega 3's. Decrease consumption of animal meats, cheeses, and dairy products. Remain active and exercise as tolerated. Continue to monitor.  - Lipid panel  3. OSA (obstructive sleep apnea) Refuses to wear CPAP Discussed risks of not wearing CPAP including increasing risk of long term health problems, diabetes, heart disease, stroke.  4. Gastroesophageal reflux disease, unspecified whether esophagitis present No suspected reflux complications ([place]/stricture). Lifestyle modification:  wt loss, avoid meals 2-3h before bedtime. Consider eliminating food triggers:  chocolate, caffeine, EtOH, acid/spicy food.    5. Primary osteoarthritis of right knee Resolved  6.  Left Knee Pain, unspecified chronicity NSAIDs/Tylenol prn Local analgesia with capsaicin/Voltaren topical Quadriceps-strengthening exercises. Knee brace. Maintain weight. Intra-articular corticosteroid injections PRN Continue to follow with Orthopedics. Awaiting MRI results.  6. BPH with obstruction/lower urinary tract symptoms Resolved  7. Hyperlipidemia, mixed Continue medications; Rosuvastatin Discussed lifestyle modifications. Recommended diet heavy in fruits and veggies, omega 3's. Decrease consumption of animal meats, cheeses, and dairy  products. Remain active and exercise as tolerated. Continue to monitor.  - Lipid panel  8. Prediabetes Education: Reviewed 'ABCs' of diabetes management  A1C (<7) Blood pressure (<130/80) Cholesterol (LDL <70) Continue Eye Exam yearly  Continue Dental Exam Q6 mo Discussed dietary recommendations Discussed Physical Activity recommendations  - COMPLETE METABOLIC PANEL WITH GFR  9. Vitamin D deficiency  - VITAMIN D 25 Hydroxy (Vit-D Deficiency, Fractures)  10. Medication management All medications discussed and reviewed in full. All questions and concerns regarding medications addressed.    - CBC with Differential/Platelet - COMPLETE METABOLIC PANEL WITH GFR - Lipid panel - VITAMIN D 25 Hydroxy (Vit-D Deficiency, Fractures)   Continue diet and meds as discussed. Further disposition pending results of labs. Discussed med's effects and SE's.   Over 20 minutes of exam, counseling, chart review, and critical decision making was performed.   Future Appointments  Date Time Provider Department Center  07/26/2022 10:30 AM [name], MD [facility] None  01/28/2023 11:00 AM [name], MD [facility] None    ----------------------------------------------------------------------------------------------------------------------  HPI 66 y.o. male  presents for 3 month follow up on hypertension, cholesterol, diabetes, weight and vitamin D deficiency.   He has recently been following with orthopedics for increase in left knee pain.  Feels as though he has a meniscus tear.  Has had steroid injection without relief.  Will have updated MRI on Monday 04/29/22.  Will follow up with Alice Rieger after MRI to conclude further POC including possible knee replacement.   BMI is Body mass index is 25.7 kg/m., he has been working on diet and exercise. Wt Readings from Last 3 Encounters:  04/25/22 174 lb (78.9 kg)  01/24/22 176 lb 6.4 oz (80 kg)  11/09/21 175 lb 12.8 oz (79.7 kg)    His  blood pressure has been controlled at home, today their BP is BP: 138/80  He does workout. He denies chest pain, shortness of breath,  dizziness.   He is on cholesterol medication Rosuvastatin and denies myalgias. His cholesterol is at goal. The cholesterol last visit was:   Lab Results  Component Value Date   CHOL 175 01/24/2022   HDL 49 01/24/2022   LDLCALC 107 (H) 01/24/2022   TRIG 96 01/24/2022   CHOLHDL 3.6 01/24/2022    He has been working on diet and exercise for prediabetes, and denies polydipsia and polyuria. Last A1C in the office was:  Lab Results  Component Value Date   HGBA1C 5.9 (H) 01/24/2022   Patient is on Vitamin D supplement.   Lab Results  Component Value Date   VD25OH 60 01/24/2022        Current Medications:  Current Outpatient Medications on File Prior to Visit  Medication Sig   Cholecalciferol 5000 units capsule Take 1 capsule (5,000 Units total) by mouth daily.   rosuvastatin (CRESTOR) 20 MG tablet Take 1 tablet Daily for Cholesterol   valsartan (DIOVAN) 80 MG tablet TAKE 1 TABLET DAILY FOR BLOOD PRESSURE   No current facility-administered medications on file prior to visit.     Allergies: No Known Allergies   Medical History:  Past Medical History:  Diagnosis Date   Heart murmur    Hyperlipidemia    Hypertension    Shingles    Vitamin D deficiency    Family history- Reviewed and unchanged Social history- Reviewed and unchanged   Review of Systems:  ROS    Physical Exam: BP 138/80   Pulse 79   Temp (!) 97.2 F (36.2 C)   Ht 5\' 9"  (1.753 m)   Wt 174 lb (78.9 kg)   SpO2 93%   BMI 25.70 kg/m  Wt Readings from Last 3 Encounters:  04/25/22 174 lb (78.9 kg)  01/24/22 176 lb 6.4 oz (80 kg)  11/09/21 175 lb 12.8 oz (79.7 kg)   General Appearance: Well nourished, in no apparent distress. Eyes: PERRLA, EOMs, conjunctiva no swelling or erythema Sinuses: No Frontal/maxillary tenderness ENT/Mouth: Ext aud canals clear, TMs without  erythema, bulging. No erythema, swelling, or exudate on post pharynx.  Tonsils not swollen or erythematous. Hearing normal.  Neck: Supple, thyroid normal.  Respiratory: Respiratory effort normal, BS equal bilaterally without rales, rhonchi, wheezing or stridor.  Cardio: RRR with no MRGs. Brisk peripheral pulses without edema.  Abdomen: Soft, + BS.  Non tender, no guarding, rebound, hernias, masses. Lymphatics: Non tender without lymphadenopathy.  Musculoskeletal: Full ROM, 5/5 strength, Antalgic gait, favors left leg. Skin: Warm, dry without rashes, lesions, ecchymosis.  Neuro: Cranial nerves intact. No cerebellar symptoms.  Psych: Awake and oriented X 3, normal affect, Insight and Judgment appropriate.    11/11/21, NP 3:46 PM [facility]

## 2022-04-25 NOTE — Patient Instructions (Signed)
Eating Plan for Brain Health A healthy diet is an important part of overall wellness, and it may help to improve brain function. Eating a healthy diet can lower the risk for Alzheimer's disease and dementia. It also may slow the progression of those diseases. Depending on your overall health and any conditions you have, you may need to follow certain dietary guidelines. Work with your health care provider or nutrition specialist (dietitian) to create an eating plan that is right for you. What are tips for following this plan? Reading food labels Check the Nutrition Facts on food labels for the Daily Value (DV) percentages of nutrients in one serving of food. DVs are based on the recommended amounts of nutrients to eat, or not to exceed, each day. Aim for a DV of 5% or less per serving for: Saturated fats. Trans fats. Cholesterol. Salt (sodium). Choose whole grains instead of processed grains such as white flour, white bread, and white rice. "Whole grain" or "whole wheat" should be among the first items in the ingredients list. Shopping Before you go grocery shopping, plan your meals and make a shopping list. Look for lean meats, fish, low-fat dairy products, fruits and vegetables, and whole grains. Avoid buying processed or prepared foods. These are higher in added sugar, fat, and sodium. Cooking Use healthy oils, such as olive oil, instead of butter or margarine. Avoid frying foods. Healthier ways of cooking include roasting, baking, poaching, and steaming. Many healthy foods can be prepared without cooking, such as canned tuna, nuts, beans, vegetables, and fruits. Meal planning Plan to eat one or more servings of each of these foods every day: Green leafy vegetables. Nuts. Whole grains. Plan to eat berries, beans, fish, and poultry two or more times every week. Avoid red meats, butter, cheese, sweets, and fried foods. Eat 6 smaller meals throughout the day rather than 3 full  meals. General tips If you have difficulty chewing or swallowing: Choose foods that are tender, soft, and moist. Avoid foods that are sticky, hard, dry, chewy, or crunchy. Cut food into pieces that are smaller than your thumbnail. If you are underweight: Add extra protein or calories to meals by adding cream, nut butters, or protein powder to foods and drinks. Drink nutritional supplement shakes as told by your health care provider or dietitian. Drink enough fluid to keep your urine pale yellow. If you drink alcohol: Limit how much you use to: 0-1 drink a day for women who are not pregnant. 0-2 drinks a day for men. Be aware of how much alcohol is in your drink. In the [place] one drink equals one 12 oz bottle of beer (355 mL), one 5 oz glass of wine (148 mL), or one 1 oz glass of hard liquor (44 mL). Take daily vitamin and mineral supplements as told by your health care provider or dietitian. Follow daily calorie and nutrient intake goals as told by your dietitian. What foods are recommended?  Foods that are high in omega-3s (omega-3 fatty acids). Omega-3s are often found in coldwater fish. They are also found in ground flaxseed, walnuts, edamame, and seaweed. It is recommended that adults eat at least 8 oz of fish or other seafood every week. Wild salmon, albacore, tuna, sardines, and farmed trout are among the best sources for omega-3s. Bake or grill fish instead of frying it to avoid unhealthy fats. Foods that contain vitamins C, D, and E. These include: Avocados. Beans. Nuts and seeds. Green leafy vegetables, like spinach and kale. Cruciferous vegetables,   like broccoli or Brussels sprouts. Cherries and berries, especially blackberries and blueberries. Berries have antioxidants and nutrients that support memory function. Whole grains, such as oatmeal, whole-grain cereal, whole-grain bread, brown rice, and barley. Herbs and spices. Cooking with certain herbs and spices, such as  turmeric, can help you absorb vitamins. Foods in the Mediterranean diet, the DASH (Dietary Approaches to Stop Hypertension) diet, or a combination of foods in both plans. These diets recommend that you: Eat green leafy vegetables, nuts, and whole grains every day. Drink one glass of wine a day if appropriate. Eat berries, beans, fish, and poultry two or more times every week. Limit red meats, butter, cheese, sweets, fried foods, and fast food to once a week or less. Healthy breakfast foods, such as whole-grain cereal, low-fat yogurt, berries or vegetables, and nuts. Eating breakfast every day can boost brain function and concentration for people of all ages. What foods are not recommended? Foods that are high in trans fats, including: Fried foods. Snack foods such as potato chips. Pastries like cookies and donuts, especially those that come prepackaged. Foods that are high in saturated fats, including: Processed, precooked, or cured meat, such as sausages or meat loaves. Red meat. Certain dairy products like butter, margarine, and some cheeses. Processed grains, such as white bread. Sweets and fast food. Limit these types of food to once a week or less. Summary Eating a nutritious diet can support brain health as well as overall wellness. Choose foods that are high in omega-3 fatty acids, vitamins, and whole grains. Avoid foods that contain trans fats and saturated fats. Limit sweets and fast food. This information is not intended to replace advice given to you by your health care provider. Make sure you discuss any questions you have with your health care provider. Document Revised: 12/29/2019 Document Reviewed: 12/29/2019 Elsevier Patient Education  2023 Elsevier Inc.  

## 2022-04-26 LAB — COMPLETE METABOLIC PANEL WITH GFR
AG Ratio: 1.7 (calc) (ref 1.0–2.5)
ALT: 10 U/L (ref 9–46)
AST: 12 U/L (ref 10–35)
Albumin: 4.3 g/dL (ref 3.6–5.1)
Alkaline phosphatase (APISO): 72 U/L (ref 35–144)
BUN: 19 mg/dL (ref 7–25)
CO2: 30 mmol/L (ref 20–32)
Calcium: 9.6 mg/dL (ref 8.6–10.3)
Chloride: 106 mmol/L (ref 98–110)
Creat: 0.99 mg/dL (ref 0.70–1.35)
Globulin: 2.6 g/dL (calc) (ref 1.9–3.7)
Glucose, Bld: 86 mg/dL (ref 65–99)
Potassium: 4.5 mmol/L (ref 3.5–5.3)
Sodium: 143 mmol/L (ref 135–146)
Total Bilirubin: 0.5 mg/dL (ref 0.2–1.2)
Total Protein: 6.9 g/dL (ref 6.1–8.1)
eGFR: 83 mL/min/{1.73_m2} (ref >=60)

## 2022-04-26 LAB — CBC WITH DIFFERENTIAL/PLATELET
Absolute Monocytes: 503 cells/uL (ref 200–950)
Basophils Absolute: 68 cells/uL (ref 0–200)
Basophils Relative: 0.9 %
Eosinophils Absolute: 293 cells/uL (ref 15–500)
Eosinophils Relative: 3.9 %
HCT: 42.1 % (ref 38.5–50.0)
Hemoglobin: 13.9 g/dL (ref 13.2–17.1)
Lymphs Abs: 2100 cells/uL (ref 850–3900)
MCH: 29.5 pg (ref 27.0–33.0)
MCHC: 33 g/dL (ref 32.0–36.0)
MCV: 89.4 fL (ref 80.0–100.0)
MPV: 11.5 fL (ref 7.5–12.5)
Monocytes Relative: 6.7 %
Neutro Abs: 4538 cells/uL (ref 1500–7800)
Neutrophils Relative %: 60.5 %
Platelets: 238 10*3/uL (ref 140–400)
RBC: 4.71 10*6/uL (ref 4.20–5.80)
RDW: 12.5 % (ref 11.0–15.0)
Total Lymphocyte: 28 %
WBC: 7.5 10*3/uL (ref 3.8–10.8)

## 2022-04-26 LAB — LIPID PANEL
Cholesterol: 148 mg/dL (ref <200)
HDL: 44 mg/dL (ref >=40)
LDL Cholesterol (Calc): 79 mg/dL (calc)
Non-HDL Cholesterol (Calc): 104 mg/dL (calc) (ref <130)
Total CHOL/HDL Ratio: 3.4 (calc) (ref <5.0)
Triglycerides: 156 mg/dL — ABNORMAL HIGH (ref <150)

## 2022-04-26 LAB — VITAMIN D 25 HYDROXY (VIT D DEFICIENCY, FRACTURES): Vit D, 25-Hydroxy: 69 ng/mL (ref 30–100)

## 2022-04-29 DIAGNOSIS — M25562 Pain in left knee: Secondary | ICD-10-CM | POA: Diagnosis not present

## 2022-05-03 DIAGNOSIS — M25562 Pain in left knee: Secondary | ICD-10-CM | POA: Diagnosis not present

## 2022-07-05 ENCOUNTER — Ambulatory Visit: Payer: BC Managed Care – PPO | Admitting: Nurse Practitioner

## 2022-07-26 ENCOUNTER — Other Ambulatory Visit: Payer: Self-pay

## 2022-07-26 ENCOUNTER — Ambulatory Visit (INDEPENDENT_AMBULATORY_CARE_PROVIDER_SITE_OTHER): Payer: BC Managed Care – PPO | Admitting: Internal Medicine

## 2022-07-26 ENCOUNTER — Encounter: Payer: Self-pay | Admitting: Internal Medicine

## 2022-07-26 VITALS — BP 136/76 | HR 86 | Temp 98.0°F | Resp 17 | Ht 69.0 in | Wt 171.6 lb

## 2022-07-26 DIAGNOSIS — R7303 Prediabetes: Secondary | ICD-10-CM

## 2022-07-26 DIAGNOSIS — E782 Mixed hyperlipidemia: Secondary | ICD-10-CM | POA: Diagnosis not present

## 2022-07-26 DIAGNOSIS — I10 Essential (primary) hypertension: Secondary | ICD-10-CM

## 2022-07-26 DIAGNOSIS — R7309 Other abnormal glucose: Secondary | ICD-10-CM | POA: Diagnosis not present

## 2022-07-26 DIAGNOSIS — Z79899 Other long term (current) drug therapy: Secondary | ICD-10-CM

## 2022-07-26 DIAGNOSIS — E559 Vitamin D deficiency, unspecified: Secondary | ICD-10-CM | POA: Diagnosis not present

## 2022-07-26 DIAGNOSIS — I700 Atherosclerosis of aorta: Secondary | ICD-10-CM

## 2022-07-26 MED ORDER — VALSARTAN 80 MG PO TABS: ORAL_TABLET | ORAL | 3 refills | Status: DC

## 2022-07-26 NOTE — Progress Notes (Unsigned)
Future Appointments  Date Time Provider Department  07/26/2022 10:30 AM Unk Pinto, MD [facility]  01/28/2023 11:00 AM Unk Pinto, MD [facility]    History of Present Illness:       This very nice 66 y.o. DWM presents for 6  month follow up with HTN, HLD, Pre-Diabetes, OSA/CPAP  and Vitamin D Deficiency. Abd CT scan in Jan 2022 showed Aortic Atherosclerosis .       Patient is treated for HTN  since  2004  & BP has been controlled at home. Today's BP is at goal -  136/76. Patient has had no complaints of any cardiac type chest pain, palpitations, dyspnea Vertell Limber /PND, dizziness, claudication or dependent edema.       Hyperlipidemia is controlled with diet & Simvastatin. Patient denies myalgias or other med SE's. Last Lipids were at goal :  Lab Results  Component Value Date   CHOL 148 04/25/2022   HDL 44 04/25/2022   LDLCALC 79 04/25/2022   TRIG 156 (H) 04/25/2022   CHOLHDL 3.4 04/25/2022     Also, the patient has history of PreDiabetes  (A1c 6.0% /2015) and has had no symptoms of reactive hypoglycemia, diabetic polys, paresthesias or visual blurring.  Last A1c was near goal :  Lab Results  Component Value Date   HGBA1C 5.9 (H) 01/24/2022                                                          Further, the patient also has history of Vitamin D Deficiency  ("35" / 2009) and supplements vitamin D . Last vitamin D was at goal :  Lab Results  Component Value Date   VD25OH 69 04/25/2022     Current Outpatient Medications on File Prior to Visit  Medication Sig   Cholecalciferol 5000 units  Take 1 capsule  daily.   rosuvastatin 20 MG tablet Take 1 tablet Daily    valsartan 80 MG tablet TAKE 1 TABLET DAILY      No Known Allergies   PMHx:   Past Medical History:  Diagnosis Date   Heart murmur    Hyperlipidemia    Hypertension    Shingles    Vitamin D deficiency      Immunization History  Administered Date(s) Administered   Influenza Inj  Mdck Quad  09/02/2017   PFIZER-SARS-COV-2 Vacc 11/15/2019, 12/13/2019, 07/14/2020   PPD Test 09/02/2017   Pneumococcal -13 01/18/2019   Pneumococcal -23 02/17/2020   Tdap 10/19/2008     Past Surgical History:  Procedure Laterality Date   ARTHROSCOPY WITH ANTERIOR CRUCIATE LIGAMENT (ACL) REPAIR WITH ANTERIOR TIBILIAS GRAFT Right    INGUINAL HERNIA REPAIR Bilateral 09/28/2015   Procedure: LAPAROSCOPIC BILATERAL INGUINAL HERNIA REPAIR;  Surgeon: Greer Pickerel, MD;  Location: [facility];  Service: General;  Laterality: Bilateral;   INSERTION OF MESH Bilateral 09/28/2015   Procedure: INSERTION OF MESH;  Surgeon: Greer Pickerel, MD;  Location: [facility];  Service: General;  Laterality: Bilateral;   SHOULDER ARTHROSCOPY     TOTAL KNEE ARTHROPLASTY Right 01/11/2020   Procedure: TOTAL KNEE ARTHROPLASTY;  Surgeon: Renette Butters, MD;  Location: [facility];  Service: Orthopedics;  Laterality: Right;    FHx:    Reviewed / unchanged  SHx:    Reviewed / unchanged   Systems  Review:  Constitutional: Denies fever, chills, wt changes, headaches, insomnia, fatigue, night sweats, change in appetite. Eyes: Denies redness, blurred vision, diplopia, discharge, itchy, watery eyes.  ENT: Denies discharge, congestion, post nasal drip, epistaxis, sore throat, earache, hearing loss, dental pain, tinnitus, vertigo, sinus pain, snoring.  CV: Denies chest pain, palpitations, irregular heartbeat, syncope, dyspnea, diaphoresis, orthopnea, PND, claudication or edema. Respiratory: denies cough, dyspnea, DOE, pleurisy, hoarseness, laryngitis, wheezing.  Gastrointestinal: Denies dysphagia, odynophagia, heartburn, reflux, water brash, abdominal pain or cramps, nausea, vomiting, bloating, diarrhea, constipation, hematemesis, melena, hematochezia  or hemorrhoids. Genitourinary: Denies dysuria, frequency, urgency, nocturia, hesitancy, discharge, hematuria or flank pain. Musculoskeletal: Denies arthralgias, myalgias, stiffness, jt.  swelling, pain, limping or strain/sprain.  Skin: Denies pruritus, rash, hives, warts, acne, eczema or change in skin lesion(s). Neuro: No weakness, tremor, incoordination, spasms, paresthesia or pain. Psychiatric: Denies confusion, memory loss or sensory loss. Endo: Denies change in weight, skin or hair change.  Heme/Lymph: No excessive bleeding, bruising or enlarged lymph nodes.  Physical Exam  BP 136/76   Pulse 86   Temp 98 F (36.7 C)   Resp 17   Ht 5\' 9"  (1.753 m)   Wt 171 lb 9.6 oz (77.8 kg)   SpO2 97%   BMI 25.34 kg/m   Appears  well nourished, well groomed  and in no distress.  Eyes: PERRLA, EOMs, conjunctiva no swelling or erythema. Sinuses: No frontal/maxillary tenderness ENT/Mouth: EAC's clear, TM's nl w/o erythema, bulging. Nares clear w/o erythema, swelling, exudates. Oropharynx clear without erythema or exudates. Oral hygiene is good. Tongue normal, non obstructing. Hearing intact.  Neck: Supple. Thyroid not palpable. Car 2+/2+ without bruits, nodes or JVD. Chest: Respirations nl with BS clear & equal w/o rales, rhonchi, wheezing or stridor.  Cor: Heart sounds normal w/ regular rate and rhythm without sig. murmurs, gallops, clicks or rubs. Peripheral pulses normal and equal  without edema.  Abdomen: Soft & bowel sounds normal. Non-tender w/o guarding, rebound, hernias, masses or organomegaly.  Lymphatics: Unremarkable.  Musculoskeletal: Full ROM all peripheral extremities, joint stability, 5/5 strength and normal gait.  Skin: Warm, dry without exposed rashes, lesions or ecchymosis apparent.  Neuro: Cranial nerves intact, reflexes equal bilaterally. Sensory-motor testing grossly intact. Tendon reflexes grossly intact.  Pysch: Alert & oriented x 3.  Insight and judgement nl & appropriate. No ideations.  Assessment and Plan:  1. Essential hypertension  - Continue medication, monitor blood pressure at home.  - Continue DASH diet.  Reminder to go to the ER if any CP,   SOB, nausea, dizziness, severe HA, changes vision/speech.   - CBC with Differential/Platelet - COMPLETE METABOLIC PANEL WITH GFR - Magnesium - TSH  2. Hyperlipidemia, mixed  - Continue diet/meds, exercise,& lifestyle modifications.  - Continue monitor periodic cholesterol/liver & renal functions    - Lipid panel - TSH  3. Abnormal glucose  - Continue diet, exercise  - Lifestyle modifications.  - Monitor appropriate labs   - Hemoglobin A1c - Insulin, random  4. Vitamin D deficiency  - Continue supplementation    - VITAMIN D 25 Hydroxy   5. Prediabetes  - Hemoglobin A1c - Insulin, random  6. Aortic atherosclerosis ([place]) by Abd CTscan on 11/03/2020  - Lipid panel  7. Medication management  - CBC with Differential/Platelet - COMPLETE METABOLIC PANEL WITH GFR - Magnesium - Lipid panel - TSH - Hemoglobin A1c - Insulin, random - VITAMIN D 25 Hydroxy        Discussed  regular exercise, BP monitoring, weight control  to achieve/maintain BMI less than 25 and discussed med and SE's. Recommended labs to assess /monitor clinical status .  [name] The patient was provided an opportunity to ask questions and all were answered. The patient agreed with the plan and demonstrated an understanding of the instructions.  I provided over 30 minutes of exam, counseling, chart review and  complex critical decision making.        The patient was advised to call back or seek an in-person evaluation if the symptoms worsen or if the condition fails to improve as anticipated.   Kirtland Bouchard, MD

## 2022-07-26 NOTE — Patient Instructions (Signed)

## 2022-07-27 NOTE — Progress Notes (Signed)
<><><><><><><><><><><><><><><><><><><><><><><><><><><><><><><><><> <><><><><><><><><><><><><><><><><><><><><><><><><><><><><><><><><> -   Test results slightly outside the reference range are not unusual. If there is anything important, I will review this with you,  otherwise it is considered normal test values.  If you have further questions,  please do not hesitate to contact me at the office or via My Chart.  <><><><><><><><><><><><><><><><><><><><><><><><><><><><><><><><><> <><><><><><><><><><><><><><><><><><><><><><><><><><><><><><><><><>  -  A1c = 6.2%  Blood sugar and A1c are elevated in the borderline and                                                           early or pre-diabetes range which has the same   300% increased risk for heart attack, stroke, cancer and                                              alzheimer- type vascular dementia as full blown diabetes.   But the good news is that diet, exercise with weight loss can                                                                                 cure the early diabetes at this point. <><><><><><><><><><><><><><><><><><><><><><><><><><><><><><><><><> <><><><><><><><><><><><><><><><><><><><><><><><><><><><><><><><><>  -  Total Chol = 153   &   LDL Chol = 83   - Both  Excellent   - Very low risk for Heart Attack  / Stroke <><><><><><><><><><><><><><><><><><><><><><><><><><><><><><><><><> <><><><><><><><><><><><><><><><><><><><><><><><><><><><><><><><><>  -  Vitamin D = 68 - Great - Please keep dose same  <><><><><><><><><><><><><><><><><><><><><><><><><><><><><><><><><> <><><><><><><><><><><><><><><><><><><><><><><><><><><><><><><><><>  -  All Else - CBC - Kidneys - Electrolytes - Liver - Magnesium & Thyroid    - all  Normal / OK <><><><><><><><><><><><><><><><><><><><><><><><><><><><><><><><><> <><><><><><><><><><><><><><><><><><><><><><><><><><><><><><><><><>  -  Keep up there Great Work    !  <><><><><><><><><><><><><><><><><><><><><><><><><><><><><><><><><> <><><><><><><><><><><><><><><><><><><><><><><><><><><><><><><><><>

## 2022-07-29 LAB — LIPID PANEL
Cholesterol: 153 mg/dL (ref <200)
HDL: 50 mg/dL (ref >=40)
LDL Cholesterol (Calc): 86 mg/dL (calc)
Non-HDL Cholesterol (Calc): 103 mg/dL (calc) (ref <130)
Total CHOL/HDL Ratio: 3.1 (calc) (ref <5.0)
Triglycerides: 83 mg/dL (ref <150)

## 2022-07-29 LAB — COMPLETE METABOLIC PANEL WITH GFR
AG Ratio: 1.8 (calc) (ref 1.0–2.5)
ALT: 13 U/L (ref 9–46)
AST: 14 U/L (ref 10–35)
Albumin: 4.5 g/dL (ref 3.6–5.1)
Alkaline phosphatase (APISO): 75 U/L (ref 35–144)
BUN: 13 mg/dL (ref 7–25)
CO2: 29 mmol/L (ref 20–32)
Calcium: 9.6 mg/dL (ref 8.6–10.3)
Chloride: 105 mmol/L (ref 98–110)
Creat: 0.8 mg/dL (ref 0.70–1.35)
Globulin: 2.5 g/dL (calc) (ref 1.9–3.7)
Glucose, Bld: 80 mg/dL (ref 65–99)
Potassium: 4.3 mmol/L (ref 3.5–5.3)
Sodium: 142 mmol/L (ref 135–146)
Total Bilirubin: 0.5 mg/dL (ref 0.2–1.2)
Total Protein: 7 g/dL (ref 6.1–8.1)
eGFR: 96 mL/min/{1.73_m2} (ref >=60)

## 2022-07-29 LAB — CBC WITH DIFFERENTIAL/PLATELET
Absolute Monocytes: 511 cells/uL (ref 200–950)
Basophils Absolute: 58 cells/uL (ref 0–200)
Basophils Relative: 0.8 %
Eosinophils Absolute: 259 cells/uL (ref 15–500)
Eosinophils Relative: 3.6 %
HCT: 41.3 % (ref 38.5–50.0)
Hemoglobin: 13.9 g/dL (ref 13.2–17.1)
Lymphs Abs: 1908 cells/uL (ref 850–3900)
MCH: 29.3 pg (ref 27.0–33.0)
MCHC: 33.7 g/dL (ref 32.0–36.0)
MCV: 86.9 fL (ref 80.0–100.0)
MPV: 10.7 fL (ref 7.5–12.5)
Monocytes Relative: 7.1 %
Neutro Abs: 4464 cells/uL (ref 1500–7800)
Neutrophils Relative %: 62 %
Platelets: 251 10*3/uL (ref 140–400)
RBC: 4.75 10*6/uL (ref 4.20–5.80)
RDW: 13 % (ref 11.0–15.0)
Total Lymphocyte: 26.5 %
WBC: 7.2 10*3/uL (ref 3.8–10.8)

## 2022-07-29 LAB — INSULIN, RANDOM: Insulin: 44.6 u[IU]/mL — ABNORMAL HIGH

## 2022-07-29 LAB — HEMOGLOBIN A1C
Hgb A1c MFr Bld: 6.2 % of total Hgb — ABNORMAL HIGH (ref <5.7)
Mean Plasma Glucose: 131 mg/dL
eAG (mmol/L): 7.3 mmol/L

## 2022-07-29 LAB — TSH: TSH: 2.16 mIU/L (ref 0.40–4.50)

## 2022-07-29 LAB — VITAMIN D 25 HYDROXY (VIT D DEFICIENCY, FRACTURES): Vit D, 25-Hydroxy: 68 ng/mL (ref 30–100)

## 2022-07-29 LAB — MAGNESIUM: Magnesium: 2 mg/dL (ref 1.5–2.5)

## 2022-10-25 ENCOUNTER — Other Ambulatory Visit: Payer: Self-pay | Admitting: Internal Medicine

## 2022-10-25 DIAGNOSIS — M25562 Pain in left knee: Secondary | ICD-10-CM | POA: Diagnosis not present

## 2022-10-25 DIAGNOSIS — E782 Mixed hyperlipidemia: Secondary | ICD-10-CM

## 2022-10-28 ENCOUNTER — Encounter: Payer: Self-pay | Admitting: Nurse Practitioner

## 2022-10-28 ENCOUNTER — Ambulatory Visit (INDEPENDENT_AMBULATORY_CARE_PROVIDER_SITE_OTHER): Payer: BC Managed Care – PPO | Admitting: Nurse Practitioner

## 2022-10-28 VITALS — BP 150/84 | HR 76 | Temp 97.5°F | Ht 69.0 in | Wt 166.8 lb

## 2022-10-28 DIAGNOSIS — E782 Mixed hyperlipidemia: Secondary | ICD-10-CM | POA: Diagnosis not present

## 2022-10-28 DIAGNOSIS — Z79899 Other long term (current) drug therapy: Secondary | ICD-10-CM

## 2022-10-28 DIAGNOSIS — G4733 Obstructive sleep apnea (adult) (pediatric): Secondary | ICD-10-CM

## 2022-10-28 DIAGNOSIS — N138 Other obstructive and reflux uropathy: Secondary | ICD-10-CM

## 2022-10-28 DIAGNOSIS — Z01818 Encounter for other preprocedural examination: Secondary | ICD-10-CM

## 2022-10-28 DIAGNOSIS — I10 Essential (primary) hypertension: Secondary | ICD-10-CM

## 2022-10-28 DIAGNOSIS — I700 Atherosclerosis of aorta: Secondary | ICD-10-CM

## 2022-10-28 DIAGNOSIS — K219 Gastro-esophageal reflux disease without esophagitis: Secondary | ICD-10-CM

## 2022-10-28 DIAGNOSIS — E559 Vitamin D deficiency, unspecified: Secondary | ICD-10-CM | POA: Diagnosis not present

## 2022-10-28 DIAGNOSIS — M25562 Pain in left knee: Secondary | ICD-10-CM

## 2022-10-28 DIAGNOSIS — Z136 Encounter for screening for cardiovascular disorders: Secondary | ICD-10-CM | POA: Diagnosis not present

## 2022-10-28 DIAGNOSIS — R7303 Prediabetes: Secondary | ICD-10-CM

## 2022-10-28 DIAGNOSIS — N401 Enlarged prostate with lower urinary tract symptoms: Secondary | ICD-10-CM

## 2022-10-28 NOTE — Patient Instructions (Signed)

## 2022-10-28 NOTE — Progress Notes (Signed)
FOLLOW UP & SURGICAL CLERANCE  Assessment and Plan:   Essential hypertension Elevated today - out of BP medication Valsartan Continue to take as directed. Discussed DASH (Dietary Approaches to Stop Hypertension) DASH diet is lower in sodium than a typical American diet. Cut back on foods that are high in saturated fat, cholesterol, and trans fats. Eat more whole-grain foods, fish, poultry, and nuts Remain active and exercise as tolerated daily.  Monitor BP at home-Call if greater than 130/80.   Aortic atherosclerosis ([facility]) by Abd CTscan on 11/03/2020 Continue medications; Rosuvastatin Discussed lifestyle modifications. Recommended diet heavy in fruits and veggies, omega 3's. Decrease consumption of animal meats, cheeses, and dairy products. Remain active and exercise as tolerated. Continue to monitor.  OSA (obstructive sleep apnea) Refuses to wear CPAP Discussed risks of not wearing CPAP including increasing risk of long term health problems, diabetes, heart disease, stroke.  Gastroesophageal reflux disease, unspecified whether esophagitis present No suspected reflux complications ([place]/stricture). Lifestyle modification:  wt loss, avoid meals 2-3h before bedtime. Consider eliminating food triggers:  chocolate, caffeine, EtOH, acid/spicy food.   Left Knee Pain, unspecified chronicity/Surgical clerance Surgery to be schedule upon clearance around 12/2022 with Dr. Eulah Pont NSAIDs/Tylenol prn Local analgesia with capsaicin/Voltaren topical Quadriceps-strengthening exercises. Knee brace. Maintain weight. Intra-articular corticosteroid injections PRN Continue to follow with Orthopedics. Updated CXR ordered. EKG completed Updated lab work ordered.  BPH with obstruction/lower urinary tract symptoms Continue to monitor  Hyperlipidemia, mixed Continue medications; Rosuvastatin Discussed lifestyle modifications. Recommended diet heavy in fruits and veggies, omega 3's. Decrease  consumption of animal meats, cheeses, and dairy products. Remain active and exercise as tolerated. Continue to monitor.  Prediabetes Education: Reviewed 'ABCs' of diabetes management  A1C (<7) Blood pressure (<130/80) Cholesterol (LDL <70) Continue Eye Exam yearly  Continue Dental Exam Q6 mo Discussed dietary recommendations Discussed Physical Activity recommendations  Vitamin D deficiency Continue supplement Monitor levels  Medication management All medications discussed and reviewed in full. All questions and concerns regarding medications addressed.    Screening for heart disease/surgical clearance EKG CXR  Orders Placed This Encounter  Procedures   DG Chest 2 View    Standing Status:   Future    Standing Expiration Date:   10/29/2023    Order Specific Question:   Reason for Exam (SYMPTOM  OR DIAGNOSIS REQUIRED)    Answer:   Surgical Clearance    Order Specific Question:   Preferred imaging location?    Answer:   [facility]   CBC with Differential/Platelet   COMPLETE METABOLIC PANEL WITH GFR   Lipid panel   Hemoglobin A1c   VITAMIN D 25 Hydroxy (Vit-D Deficiency, Fractures)   EKG 12-Lead   Notify office for further evaluation and treatment, questions or concerns if any reported s/s fail to improve.   The patient was advised to call back or seek an in-person evaluation if any symptoms worsen or if the condition fails to improve as anticipated.   Further disposition pending results of labs. Discussed med's effects and SE's.    [name] The patient was provided an opportunity to ask questions and all were answered. The patient agreed with the plan and demonstrated an understanding of the instructions.  Discussed med's effects and SE's. Screening labs and tests as requested with regular follow-up as recommended.  I provided 25 minutes of face-to-face time during this encounter including counseling, chart  review, and critical decision making was preformed.  Future Appointments  Date Time  Provider [place]  01/28/2023 11:00 AM Unk Pinto, MD [facility] None    ----------------------------------------------------------------------------------------------------------------------  HPI 67 y.o. male  presents for 3 month follow up on hypertension, cholesterol, diabetes, weight and vitamin D deficiency.   He has recently been following with orthopedics for increase in left knee pain.  Feels as though he has a meniscus tear.  Has had steroid injection without relief.  Had updated MRI on Monday 04/29/22. Continuing to have pain without relief from steroid injections.  Following with [name] and plans to have left knee replacement 12/2022.  BMI is Body mass index is 24.63 kg/m., he has been working on diet and exercise. Wt Readings from Last 3 Encounters:  10/28/22 166 lb 12.8 oz (75.7 kg)  07/26/22 171 lb 9.6 oz (77.8 kg)  04/25/22 174 lb (78.9 kg)    His blood pressure has been controlled at home, today their BP is  above goal.  He has been out of his BP medication for the last three days.  Reports that pharmacy advised his BP medication will be in stock tomorrow 10/29/22 to which he plans to take.     He does workout. He denies chest pain, shortness of breath, dizziness.   He is on cholesterol medication Rosuvastatin and denies myalgias. His cholesterol is at goal. The cholesterol last visit was:   Lab Results  Component Value Date   CHOL 153 07/26/2022   HDL 50 07/26/2022   LDLCALC 86 07/26/2022   TRIG 83 07/26/2022   CHOLHDL 3.1 07/26/2022    He has been working on diet and exercise for prediabetes, and denies polydipsia and polyuria. Last A1C in the office was:  Lab Results  Component Value Date   HGBA1C 6.2 (H) 07/26/2022   Patient is on Vitamin D supplement.   Lab Results  Component Value Date   VD25OH 68 07/26/2022     BPH with controlled symptoms.  Not  currently on medication. Denies nocturia.  Lab Results  Component Value Date   PSA 3.11 01/24/2022   PSA 2.7 02/17/2020   PSA 2.3 01/18/2019    Current Medications:  Current Outpatient Medications on File Prior to Visit  Medication Sig   Cholecalciferol 5000 units capsule Take 1 capsule (5,000 Units total) by mouth daily.   rosuvastatin (CRESTOR) 20 MG tablet TAKE 1 TABLET BY MOUTH EVERY DAY FOR CHOLESTEROL   valsartan (DIOVAN) 80 MG tablet TAKE 1 TABLET DAILY FOR BLOOD PRESSURE   No current facility-administered medications on file prior to visit.     Allergies: No Known Allergies   Medical History:  Past Medical History:  Diagnosis Date   Heart murmur    Hyperlipidemia    Hypertension    Shingles    Vitamin D deficiency    Family history- Reviewed and unchanged Social history- Reviewed and unchanged   Review of Systems:  Review of Systems  Constitutional: Negative.   HENT: Negative.    Eyes: Negative.   Respiratory: Negative.    Cardiovascular: Negative.   Gastrointestinal: Negative.   Genitourinary: Negative.   Musculoskeletal:  Positive for joint pain (left knee).  Skin: Negative.   Neurological: Negative.   Endo/Heme/Allergies: Negative.   Psychiatric/Behavioral: Negative.      Physical Exam: Ht 5\' 9"  (1.753 m)   Wt 166 lb 12.8 oz (75.7 kg)   BMI 24.63 kg/m   General Appearance: Well nourished, in no apparent distress. Eyes: PERRLA, EOMs, conjunctiva no swelling or erythema Sinuses: No Frontal/maxillary tenderness ENT/Mouth: Ext aud  canals clear, TMs without erythema, bulging. No erythema, swelling, or exudate on post pharynx.  Tonsils not swollen or erythematous. Hearing normal.  Neck: Supple, thyroid normal.  Respiratory: Respiratory effort normal, BS equal bilaterally without rales, rhonchi, wheezing or stridor.  Cardio: RRR with no MRGs. Brisk peripheral pulses without edema.  Abdomen: Soft, + BS.  Non tender, no guarding, rebound, hernias,  masses. Lymphatics: Non tender without lymphadenopathy.  Musculoskeletal: Full ROM, 5/5 strength, Antalgic gait, favors left leg upon ambulation. Skin: Warm, dry without rashes, lesions, ecchymosis.  Neuro: Cranial nerves intact. No cerebellar symptoms.  Psych: Awake and oriented X 3, normal affect, Insight and Judgment appropriate.   EKG:  NSR  Darrol Jump, NP 11:19 AM [facility]

## 2022-10-29 LAB — COMPLETE METABOLIC PANEL WITH GFR
AG Ratio: 1.7 (calc) (ref 1.0–2.5)
ALT: 10 U/L (ref 9–46)
AST: 12 U/L (ref 10–35)
Albumin: 4.5 g/dL (ref 3.6–5.1)
Alkaline phosphatase (APISO): 68 U/L (ref 35–144)
BUN: 22 mg/dL (ref 7–25)
CO2: 30 mmol/L (ref 20–32)
Calcium: 10 mg/dL (ref 8.6–10.3)
Chloride: 105 mmol/L (ref 98–110)
Creat: 0.85 mg/dL (ref 0.70–1.35)
Globulin: 2.6 g/dL (calc) (ref 1.9–3.7)
Glucose, Bld: 72 mg/dL (ref 65–99)
Potassium: 4 mmol/L (ref 3.5–5.3)
Sodium: 143 mmol/L (ref 135–146)
Total Bilirubin: 0.5 mg/dL (ref 0.2–1.2)
Total Protein: 7.1 g/dL (ref 6.1–8.1)
eGFR: 95 mL/min/{1.73_m2} (ref >=60)

## 2022-10-29 LAB — LIPID PANEL
Cholesterol: 157 mg/dL (ref <200)
HDL: 51 mg/dL (ref >=40)
LDL Cholesterol (Calc): 88 mg/dL (calc)
Non-HDL Cholesterol (Calc): 106 mg/dL (calc) (ref <130)
Total CHOL/HDL Ratio: 3.1 (calc) (ref <5.0)
Triglycerides: 90 mg/dL (ref <150)

## 2022-10-29 LAB — CBC WITH DIFFERENTIAL/PLATELET
Absolute Monocytes: 699 cells/uL (ref 200–950)
Basophils Absolute: 74 cells/uL (ref 0–200)
Basophils Relative: 0.8 %
Eosinophils Absolute: 304 cells/uL (ref 15–500)
Eosinophils Relative: 3.3 %
HCT: 41.7 % (ref 38.5–50.0)
Hemoglobin: 14.1 g/dL (ref 13.2–17.1)
Lymphs Abs: 2668 cells/uL (ref 850–3900)
MCH: 29.9 pg (ref 27.0–33.0)
MCHC: 33.8 g/dL (ref 32.0–36.0)
MCV: 88.5 fL (ref 80.0–100.0)
MPV: 11.3 fL (ref 7.5–12.5)
Monocytes Relative: 7.6 %
Neutro Abs: 5456 cells/uL (ref 1500–7800)
Neutrophils Relative %: 59.3 %
Platelets: 248 10*3/uL (ref 140–400)
RBC: 4.71 10*6/uL (ref 4.20–5.80)
RDW: 12.9 % (ref 11.0–15.0)
Total Lymphocyte: 29 %
WBC: 9.2 10*3/uL (ref 3.8–10.8)

## 2022-10-29 LAB — HEMOGLOBIN A1C
Hgb A1c MFr Bld: 6.1 % of total Hgb — ABNORMAL HIGH (ref <5.7)
Mean Plasma Glucose: 128 mg/dL
eAG (mmol/L): 7.1 mmol/L

## 2022-10-29 LAB — VITAMIN D 25 HYDROXY (VIT D DEFICIENCY, FRACTURES): Vit D, 25-Hydroxy: 65 ng/mL (ref 30–100)

## 2022-10-30 ENCOUNTER — Ambulatory Visit
Admission: RE | Admit: 2022-10-30 | Discharge: 2022-10-30 | Disposition: A | Discharge status: Home / Self Care | Payer: BC Managed Care – PPO | Source: Ambulatory Visit | Attending: Nurse Practitioner | Admitting: Nurse Practitioner

## 2022-10-30 ENCOUNTER — Other Ambulatory Visit: Payer: Self-pay | Admitting: Nurse Practitioner

## 2022-10-30 DIAGNOSIS — Z01818 Encounter for other preprocedural examination: Secondary | ICD-10-CM | POA: Diagnosis not present

## 2022-11-04 ENCOUNTER — Telehealth: Payer: Self-pay | Admitting: Nurse Practitioner

## 2022-11-04 NOTE — Telephone Encounter (Signed)
Per Patient, Dr. Debroah Loop office faxed over a form to be completed this past Friday (11/01/2022). Did we receive it? He cannot have his surgery until it's done and sent back.Marland KitchenMarland Kitchen

## 2022-11-04 NOTE — Telephone Encounter (Signed)
Forms were faxed today. 

## 2022-11-05 ENCOUNTER — Other Ambulatory Visit (HOSPITAL_COMMUNITY): Payer: BC Managed Care – PPO

## 2022-11-08 LAB — HM DIABETES EYE EXAM

## 2022-12-30 DIAGNOSIS — M25562 Pain in left knee: Secondary | ICD-10-CM | POA: Diagnosis not present

## 2023-01-09 DIAGNOSIS — M1712 Unilateral primary osteoarthritis, left knee: Secondary | ICD-10-CM | POA: Diagnosis not present

## 2023-01-09 DIAGNOSIS — Z96652 Presence of left artificial knee joint: Secondary | ICD-10-CM | POA: Diagnosis not present

## 2023-01-09 DIAGNOSIS — G8918 Other acute postprocedural pain: Secondary | ICD-10-CM | POA: Diagnosis not present

## 2023-01-13 DIAGNOSIS — M1712 Unilateral primary osteoarthritis, left knee: Secondary | ICD-10-CM | POA: Diagnosis not present

## 2023-01-22 DIAGNOSIS — M1712 Unilateral primary osteoarthritis, left knee: Secondary | ICD-10-CM | POA: Diagnosis not present

## 2023-01-23 DIAGNOSIS — M1712 Unilateral primary osteoarthritis, left knee: Secondary | ICD-10-CM | POA: Diagnosis not present

## 2023-01-27 ENCOUNTER — Encounter: Payer: Self-pay | Admitting: Internal Medicine

## 2023-01-27 DIAGNOSIS — M1712 Unilateral primary osteoarthritis, left knee: Secondary | ICD-10-CM | POA: Diagnosis not present

## 2023-01-27 NOTE — Progress Notes (Signed)
Annual  Screening/Preventative Visit  & Comprehensive Evaluation & Examination   Future Appointments  Date Time Provider Department  01/28/2023                cpe 11:00 AM [name], MD [facility]  02/06/2024                cpe 11:00 AM [name], MD [facility]         This very nice 67 y.o. DWM with  HTN, HLD, Prediabetes and Vitamin D Deficiency presents for a Screening /Preventative Visit & comprehensive evaluation and management of multiple medical co-morbidities.   Patient is on CPAP for OSA since  Apr 2019  followed by Dr Frances Furbish. Abd CT scan  in Jan 2022 showed Aortic Atherosclerosis.        HTN predates circa 2004. Patient's BP has been controlled at home.  Today's BP is at goal -  130/70 . Patient denies any cardiac symptoms as chest pain, palpitations, shortness of breath, dizziness or ankle swelling.       Patient's hyperlipidemia is controlled with diet and Simvastatin. Patient denies myalgias or other medication SE's. Last lipids were at goal :  Lab Results  Component Value Date   CHOL 157 10/28/2022   HDL 51 10/28/2022   LDLCALC 88 10/28/2022   TRIG 90 10/28/2022   CHOLHDL 3.1 10/28/2022         Patient has hx/o prediabetes (A1c 6.0% /2015) and patient denies reactive hypoglycemic symptoms, visual blurring, diabetic polys or paresthesias. Last A1c was not at goal :   Lab Results  Component Value Date   HGBA1C 6.1 (H) 10/28/2022         Finally, patient has history of Vitamin D Deficiency ("35" /2009) and last vitamin D was at goal :   Lab Results  Component Value Date   VD25OH 65 10/28/2022       Current Outpatient Medications  Medication Instructions   Cholecalciferol 5,000 Units    Daily   rosuvastatin    20 MG tablet TAKE 1 TABLET EVERY DAY    valsartan (DIOVAN) 80 MG tablet TAKE 1 TABLET DAILY      No Known Allergies    Past Medical History:  Diagnosis Date   Heart murmur    Hyperlipidemia    Hypertension    Shingles     Vitamin D deficiency      Health Maintenance  Topic Date Due   Hepatitis C Screening  Never done   Zoster Vaccines- Shingrix (1 of 2) Never done   TETANUS/TDAP  10/19/2018   COVID-19 Vaccine (4 - Booster for Pfizer series) 09/08/2020   INFLUENZA VACCINE  05/14/2022   Pneumonia Vaccine 65+ Years old  Completed   HPV VACCINES  Aged Out     Immunization History  Administered Date(s) Administered   Influenza Inj Mdck Quad  09/02/2017   PFIZER SARS-COV-2 Vacc 11/15/2019, 12/13/2019, 07/14/2020   PPD Test 09/02/2017   Pneumococcal -13 01/18/2019   Pneumococcal -23 02/17/2020   Tdap 10/19/2008    Last Colon - 11/07/2020 - Dr Kinnie Scales - Recc 10 year f/u due Feb 2032    Past Surgical History:  Procedure Laterality Date   ARTHROSCOPY WITH ANTERIOR CRUCIATE LIGAMENT (ACL) REPAIR WITH ANTERIOR TIBILIAS GRAFT Right    INGUINAL HERNIA REPAIR Bilateral 09/28/2015   Procedure: LAPAROSCOPIC BILATERAL INGUINAL HERNIA REPAIR;  Surgeon: Gaynelle Adu, MD;  Location: [facility];  Service: General;  Laterality: Bilateral;   INSERTION  OF MESH Bilateral 09/28/2015   Procedure: INSERTION OF MESH;  Surgeon: Gaynelle Adu, MD;  Location: [facility];  Service: General;  Laterality: Bilateral;   SHOULDER ARTHROSCOPY     TOTAL KNEE ARTHROPLASTY Right 01/11/2020   Procedure: TOTAL KNEE ARTHROPLASTY;  Surgeon: Sheral Apley, MD;  Location: [facility];  Service: Orthopedics;  Laterality: Right;     Family History  Problem Relation Age of Onset   Cancer Mother        colon, lung   Heart disease Father    Hyperlipidemia Father    Heart attack Father      Social History   Tobacco Use   Smoking status: Never   Smokeless tobacco: Never  Vaping Use   Vaping Use: Never used  Substance Use Topics   Alcohol use: Yes    Comment: OCCASIONAL BEER    Drug use: No      ROS Constitutional: Denies fever, chills, weight loss/gain, headaches, insomnia,  night sweats or change in appetite. Does c/o fatigue. Eyes:  Denies redness, blurred vision, diplopia, discharge, itchy or watery eyes.  ENT: Denies discharge, congestion, post nasal drip, epistaxis, sore throat, earache, hearing loss, dental pain, Tinnitus, Vertigo, Sinus pain or snoring.  Cardio: Denies chest pain, palpitations, irregular heartbeat, syncope, dyspnea, diaphoresis, orthopnea, PND, claudication or edema Respiratory: denies cough, dyspnea, DOE, pleurisy, hoarseness, laryngitis or wheezing.  Gastrointestinal: Denies dysphagia, heartburn, reflux, water brash, pain, cramps, nausea, vomiting, bloating, diarrhea, constipation, hematemesis, melena, hematochezia, jaundice or hemorrhoids Genitourinary: Denies dysuria, frequency, urgency, nocturia, hesitancy, discharge, hematuria or flank pain Musculoskeletal: Denies arthralgia, myalgia, stiffness, Jt. Swelling, pain, limp or strain/sprain. Denies Falls. Skin: Denies puritis, rash, hives, warts, acne, eczema or change in skin lesion Neuro: No weakness, tremor, incoordination, spasms, paresthesia or pain Psychiatric: Denies confusion, memory loss or sensory loss. Denies Depression. Endocrine: Denies change in weight, skin, hair change, nocturia, and paresthesia, diabetic polys, visual blurring or hyper / hypo glycemic episodes.  Heme/Lymph: No excessive bleeding, bruising or enlarged lymph nodes.   Physical Exam  BP 130/70   Pulse 100   Temp 97.9 F (36.6 C)   Resp 16   Ht  (1.753 m)   Wt 167 lb (75.8 kg)   SpO2 98%   BMI 24.66 kg/m   General Appearance: Well nourished and well groomed and in no apparent distress.  Eyes: PERRLA, EOMs, conjunctiva no swelling or erythema, normal fundi and vessels. Sinuses: No frontal/maxillary tenderness ENT/Mouth: EACs patent / TMs  nl. Nares clear without erythema, swelling, mucoid exudates. Oral hygiene is good. No erythema, swelling, or exudate. Tongue normal, non-obstructing. Tonsils not swollen or erythematous. Hearing normal.  Neck: Supple,  thyroid not palpable. No bruits, nodes or JVD. Respiratory: Respiratory effort normal.  BS equal and clear bilateral without rales, rhonci, wheezing or stridor. Cardio: Heart sounds are normal with regular rate and rhythm and no murmurs, rubs or gallops. Peripheral pulses are normal and equal bilaterally without edema. No aortic or femoral bruits. Chest: symmetric with normal excursions and percussion.  Abdomen: Soft, with Nl bowel sounds. Nontender, no guarding, rebound, hernias, masses, or organomegaly.  Lymphatics: Non tender without lymphadenopathy.  Musculoskeletal: Full ROM all peripheral extremities, joint stability, 5/5 strength, and normal gait. Skin: Warm and dry without rashes, lesions, cyanosis, clubbing or  ecchymosis.  Neuro: Cranial nerves intact, reflexes equal bilaterally. Normal muscle tone, no cerebellar symptoms. Sensation intact.  Pysch: Alert and oriented X 3 with normal affect, insight and judgment appropriate.   Assessment and  Plan  1. Annual Preventative/Screening Exam    2. Essential hypertension  - EKG 12-Lead - [place], RETROPERITNL ABD,  LTD - Urinalysis, Routine w reflex microscopic - Microalbumin / creatinine urine ratio - CBC with Differential/Platelet - COMPLETE METABOLIC PANEL WITH GFR - Magnesium - TSH  3. Hyperlipidemia, mixed  - EKG 12-Lead - [place], RETROPERITNL ABD,  LTD - Lipid panel - TSH  4. Abnormal glucose  - EKG 12-Lead - [place], RETROPERITNL ABD,  LTD - Hemoglobin A1c - Insulin, random  5. Vitamin D deficiency  - VITAMIN D 25 Hydroxy   6. Aortic atherosclerosis (HCC) by Abd CTscan on 11/03/2020  - EKG 12-Lead - [place], RETROPERITNL ABD,  LTD - Lipid panel  7. BPH with obstruction/lower urinary tract symptoms  - PSA  8. OSA (obstructive sleep apnea)   9. Prostate cancer screening  - PSA  10. Screening for colorectal cancer  - POC Hemoccult Bld/Stl   11. Screening for heart disease  - EKG 12-Lead  12. FHx: heart  disease  - EKG 12-Lead - [place], RETROPERITNL ABD,  LTD  13. Screening for AAA (aortic abdominal aneurysm)  - [place], RETROPERITNL ABD,  LTD  14. Medication management  - Urinalysis, Routine w reflex microscopic - Microalbumin / creatinine urine ratio - CBC with Differential/Platelet - COMPLETE METABOLIC PANEL WITH GFR - Magnesium - Lipid panel - TSH - Hemoglobin A1c - Insulin, random - VITAMIN D 25 Hydroxy           Patient was counseled in prudent diet, weight control to achieve/maintain BMI less than 25, BP monitoring, regular exercise and medications as discussed.  Discussed med effects and SE's. Routine screening labs and tests as requested with regular follow-up as recommended. Over 40 minutes of exam, counseling, chart review and high complex critical decision making was performed   Marinus Maw, MD

## 2023-01-27 NOTE — Patient Instructions (Signed)

## 2023-01-28 ENCOUNTER — Ambulatory Visit (INDEPENDENT_AMBULATORY_CARE_PROVIDER_SITE_OTHER): Payer: BC Managed Care – PPO | Admitting: Internal Medicine

## 2023-01-28 ENCOUNTER — Encounter: Payer: Self-pay | Admitting: Internal Medicine

## 2023-01-28 VITALS — BP 130/70 | HR 100 | Temp 97.9°F | Resp 16 | Ht 69.0 in | Wt 167.0 lb

## 2023-01-28 DIAGNOSIS — E559 Vitamin D deficiency, unspecified: Secondary | ICD-10-CM

## 2023-01-28 DIAGNOSIS — Z79899 Other long term (current) drug therapy: Secondary | ICD-10-CM

## 2023-01-28 DIAGNOSIS — Z0001 Encounter for general adult medical examination with abnormal findings: Secondary | ICD-10-CM

## 2023-01-28 DIAGNOSIS — Z125 Encounter for screening for malignant neoplasm of prostate: Secondary | ICD-10-CM

## 2023-01-28 DIAGNOSIS — Z136 Encounter for screening for cardiovascular disorders: Secondary | ICD-10-CM

## 2023-01-28 DIAGNOSIS — Z0000 Encounter for general adult medical examination without abnormal findings: Secondary | ICD-10-CM

## 2023-01-28 DIAGNOSIS — I700 Atherosclerosis of aorta: Secondary | ICD-10-CM

## 2023-01-28 DIAGNOSIS — Z1389 Encounter for screening for other disorder: Secondary | ICD-10-CM | POA: Diagnosis not present

## 2023-01-28 DIAGNOSIS — R350 Frequency of micturition: Secondary | ICD-10-CM | POA: Diagnosis not present

## 2023-01-28 DIAGNOSIS — Z13220 Encounter for screening for lipoid disorders: Secondary | ICD-10-CM

## 2023-01-28 DIAGNOSIS — I10 Essential (primary) hypertension: Secondary | ICD-10-CM | POA: Diagnosis not present

## 2023-01-28 DIAGNOSIS — Z1211 Encounter for screening for malignant neoplasm of colon: Secondary | ICD-10-CM

## 2023-01-28 DIAGNOSIS — Z8249 Family history of ischemic heart disease and other diseases of the circulatory system: Secondary | ICD-10-CM

## 2023-01-28 DIAGNOSIS — N401 Enlarged prostate with lower urinary tract symptoms: Secondary | ICD-10-CM | POA: Diagnosis not present

## 2023-01-28 DIAGNOSIS — Z131 Encounter for screening for diabetes mellitus: Secondary | ICD-10-CM

## 2023-01-28 DIAGNOSIS — R7309 Other abnormal glucose: Secondary | ICD-10-CM

## 2023-01-28 DIAGNOSIS — N138 Other obstructive and reflux uropathy: Secondary | ICD-10-CM

## 2023-01-28 DIAGNOSIS — E782 Mixed hyperlipidemia: Secondary | ICD-10-CM

## 2023-01-28 DIAGNOSIS — G4733 Obstructive sleep apnea (adult) (pediatric): Secondary | ICD-10-CM

## 2023-01-28 LAB — CBC WITH DIFFERENTIAL/PLATELET
Absolute Monocytes: 544 cells/uL (ref 200–950)
Basophils Absolute: 38 cells/uL (ref 0–200)
Basophils Relative: 0.6 %
Eosinophils Absolute: 320 cells/uL (ref 15–500)
Eosinophils Relative: 5 %
HCT: 36.8 % — ABNORMAL LOW (ref 38.5–50.0)
Lymphs Abs: 1555 cells/uL (ref 850–3900)
MCH: 29.5 pg (ref 27.0–33.0)
Platelets: 350 10*3/uL (ref 140–400)

## 2023-01-29 DIAGNOSIS — M1712 Unilateral primary osteoarthritis, left knee: Secondary | ICD-10-CM | POA: Diagnosis not present

## 2023-01-29 LAB — CBC WITH DIFFERENTIAL/PLATELET
Hemoglobin: 12.3 g/dL — ABNORMAL LOW (ref 13.2–17.1)
MCHC: 33.4 g/dL (ref 32.0–36.0)
MCV: 88.2 fL (ref 80.0–100.0)
MPV: 10.5 fL (ref 7.5–12.5)
Monocytes Relative: 8.5 %
Neutro Abs: 3942 cells/uL (ref 1500–7800)
Neutrophils Relative %: 61.6 %
RBC: 4.17 10*6/uL — ABNORMAL LOW (ref 4.20–5.80)
RDW: 13 % (ref 11.0–15.0)
Total Lymphocyte: 24.3 %
WBC: 6.4 10*3/uL (ref 3.8–10.8)

## 2023-01-29 LAB — PSA: PSA: 4 ng/mL (ref <=4.00)

## 2023-01-29 LAB — HEMOGLOBIN A1C
Hgb A1c MFr Bld: 6.1 % of total Hgb — ABNORMAL HIGH (ref <5.7)
Mean Plasma Glucose: 128 mg/dL
eAG (mmol/L): 7.1 mmol/L

## 2023-01-29 LAB — URINALYSIS, ROUTINE W REFLEX MICROSCOPIC
Bilirubin Urine: NEGATIVE
Glucose, UA: NEGATIVE
Hgb urine dipstick: NEGATIVE
Ketones, ur: NEGATIVE
Leukocytes,Ua: NEGATIVE
Nitrite: NEGATIVE
Protein, ur: NEGATIVE
Specific Gravity, Urine: 1.012 (ref 1.001–1.035)
pH: 5.5 (ref 5.0–8.0)

## 2023-01-29 LAB — COMPLETE METABOLIC PANEL WITH GFR
AG Ratio: 1.7 (calc) (ref 1.0–2.5)
ALT: 16 U/L (ref 9–46)
AST: 17 U/L (ref 10–35)
Albumin: 4.2 g/dL (ref 3.6–5.1)
Alkaline phosphatase (APISO): 83 U/L (ref 35–144)
BUN: 19 mg/dL (ref 7–25)
CO2: 30 mmol/L (ref 20–32)
Calcium: 9.5 mg/dL (ref 8.6–10.3)
Chloride: 103 mmol/L (ref 98–110)
Creat: 0.82 mg/dL (ref 0.70–1.35)
Globulin: 2.5 g/dL (calc) (ref 1.9–3.7)
Glucose, Bld: 122 mg/dL — ABNORMAL HIGH (ref 65–99)
Potassium: 4.3 mmol/L (ref 3.5–5.3)
Sodium: 139 mmol/L (ref 135–146)
Total Bilirubin: 0.5 mg/dL (ref 0.2–1.2)
Total Protein: 6.7 g/dL (ref 6.1–8.1)
eGFR: 95 mL/min/{1.73_m2} (ref >=60)

## 2023-01-29 LAB — LIPID PANEL
Cholesterol: 144 mg/dL (ref <200)
HDL: 46 mg/dL (ref >=40)
LDL Cholesterol (Calc): 78 mg/dL (calc)
Non-HDL Cholesterol (Calc): 98 mg/dL (calc) (ref <130)
Total CHOL/HDL Ratio: 3.1 (calc) (ref <5.0)
Triglycerides: 118 mg/dL (ref <150)

## 2023-01-29 LAB — MICROALBUMIN / CREATININE URINE RATIO
Creatinine, Urine: 100 mg/dL (ref 20–320)
Microalb Creat Ratio: 8 mg/g creat (ref <30)
Microalb, Ur: 0.8 mg/dL

## 2023-01-29 LAB — TSH: TSH: 2.2 mIU/L (ref 0.40–4.50)

## 2023-01-29 LAB — VITAMIN D 25 HYDROXY (VIT D DEFICIENCY, FRACTURES): Vit D, 25-Hydroxy: 71 ng/mL (ref 30–100)

## 2023-01-29 LAB — INSULIN, RANDOM: Insulin: 68.9 u[IU]/mL — ABNORMAL HIGH

## 2023-01-29 LAB — MAGNESIUM: Magnesium: 1.8 mg/dL (ref 1.5–2.5)

## 2023-01-29 NOTE — Progress Notes (Signed)
^<^<^<^<^<^<^<^<^<^<^<^<^<^<^<^<^<^<^<^<^<^<^<^<^<^<^<^<^<^<^<^<^<^<^<^<^ ^>^>^>^>^>^>^>^>^>^>^>>^>^>^>^>^>^>^>^>^>^>^>^>^>^>^>^>^>^>^>^>^>^>^>^>^ -Test results slightly outside the reference range are not unusual. If there is anything important, I will review this with you,  otherwise it is considered normal test values.  If you have further questions,  please do not hesitate to contact me at the office or via My Chart.  ^<^<^<^<^<^<^<^<^<^<^<^<^<^<^<^<^<^<^<^<^<^<^<^<^<^<^<^<^<^<^<^<^<^<^<^<^ ^>^>^>^>^>^>^>^>^>^>^>^>^>^>^>^>^>^>^>^>^>^>^>^>^>^>^>^>^>^>^>^>^>^>^>^>^  -  A1c = 6.1% Blood sugar and A1c are  STILL elevated in the borderline and                                                              early or pre-diabetes range which has the same   300% increased risk for heart attack, stroke, cancer and                                               alzheimer- type vascular dementia as full blown diabetes.   But the good news is that diet, exercise with weight loss can                                                                                 cure the early diabetes at this point. ^>^>^>^>^>^>^>^>^>^>^>>^>^>^>^>^>^>^>^>^>^>^>^>^>^>^>^>^>^>^>^>^>^>^>^>^  -  It is very important that you work harder with diet by                                      avoiding all foods that are white except chicken, fish & calliflower.  - Avoid white rice  (brown & wild rice is OK),   - Avoid white potatoes  (sweet potatoes in moderation is OK),   White bread or wheat bread or anything made out of                                            white flour like bagels, donuts, rolls, buns, biscuits, cakes,  - pastries, cookies, pizza crust, and pasta (made from white flour & egg whites)   - vegetarian pasta or spinach or wheat pasta is OK.  - Multigrain breads like Arnold's, Pepperidge Farm or   multigrain sandwich thins or high fiber breads like   Eureka bread or "Dave's Killer" breads that are   4 to 5 grams fiber per slice !  are best.    Diet, exercise and weight loss can reverse and cure  diabetes in the early stages.    ^>^>^>^>^>^>^>^>^>^>^>>^>^>^>^>^>^>^>^>^>^>^>^>^>^>^>^>^>^>^>^>^>^>^>^>^ ^>^>^>^>^>^>^>^>^>^>^>>^>^>^>^>^>^>^>^>^>^>^>^>^>^>^>^>^>^>^>^>^>^>^>^>^  -  Insulin level  = 68.9  is very high - and shows insulin resistance - a sign of early   diabetes and associated with a 300 % greater risk for   heart attacks, strokes, cancer & Alzheimer type vascular dementia   -  All this can be cured  and prevented with losing weight   - get Dr Francis Dowse Fuhrman's book 'the End of Diabetes" and                                                                         "the End of Dieting"                                                               and add many years of good health to your life. ^>^>^>^>^>^>^>^>^>^>^>>^>^>^>^>^>^>^>^>^>^>^>^>^>^>^>^>^>^>^>^>^>^>^>^>^  -  PSA - prostate enzyme is a little higher in the Normal range                                                                                            - will continue to monitor it  ^>^>^>^>^>^>^>^>^>^>^>>^>^>^>^>^>^>^>^>^>^>^>^>^>^>^>^>^>^>^>^>^>^>^>^>^  -   Magnesium  = 1.8  is  very  low- goal is betw 2.0 - 2.5,   - So......Marland Kitchen  Recommend that you take  Magnesium 500 mg tablet 2 x /day with Meals   - also important to eat lots of  leafy green vegetables   - spinach - Kale - collards - greens - okra - asparagus  - broccoli - quinoa - squash - almonds   - black, red, white beans -  peas - green beans ^>^>^>^>^>^>^>^>^>^>^>>^>^>^>^>^>^>^>^>^>^>^>^>^>^>^>^>^>^>^>^>^>^>^>^>^ ^>^>^>^>^>^>^>^>^>^>^>>^>^>^>^>^>^>^>^>^>^>^>^>^>^>^>^>^>^>^>^>^>^>^>^>^  -  Chol = 1444 &  LDL Chol = 78  -   Both  Excellent   - Very low risk for Heart Attack  / Stroke ^>^>^>^>^>^>^>^>^>^>^>>^>^>^>^>^>^>^>^>^>^>^>^>^>^>^>^>^>^>^>^>^>^>^>^>^ ^>^>^>^>^>^>^>^>^>^>^>>^>^>^>^>^>^>^>^>^>^>^>^>^>^>^>^>^>^>^>^>^>^>^>^>^  -  Vitamin D =  71  - Excellent  - Please keep dose same  ^>^>^>^>^>^>^>^>^>^>^>>^>^>^>^>^>^>^>^>^>^>^>^>^>^>^>^>^>^>^>^>^>^>^>^>^  -  All Else - CBC - Kidneys - Electrolytes - Liver - Magnesium & Thyroid    - all  Normal / OK  ^>^>^>^>^>^>^>^>^>^>^>>^>^>^>^>^>^>^>^>^>^>^>^>^>^>^>^>^>^>^>^>^>^>^>^>^ ^>^>^>^>^>^>^>^>^>^>^>>^>^>^>^>^>^>^>^>^>^>^>^>^>^>^>^>^>^>^>^>^>^>^>^>^

## 2023-02-02 ENCOUNTER — Encounter: Payer: Self-pay | Admitting: Internal Medicine

## 2023-02-06 DIAGNOSIS — M1712 Unilateral primary osteoarthritis, left knee: Secondary | ICD-10-CM | POA: Diagnosis not present

## 2023-02-25 ENCOUNTER — Ambulatory Visit: Payer: BC Managed Care – PPO | Admitting: Internal Medicine

## 2023-02-25 NOTE — Progress Notes (Signed)
     C  A N  C  E L  L  E  D          

## 2023-02-26 DIAGNOSIS — M1712 Unilateral primary osteoarthritis, left knee: Secondary | ICD-10-CM | POA: Diagnosis not present

## 2023-04-02 DIAGNOSIS — M1712 Unilateral primary osteoarthritis, left knee: Secondary | ICD-10-CM | POA: Diagnosis not present

## 2023-04-29 ENCOUNTER — Encounter: Payer: Self-pay | Admitting: Nurse Practitioner

## 2023-04-29 ENCOUNTER — Ambulatory Visit (INDEPENDENT_AMBULATORY_CARE_PROVIDER_SITE_OTHER): Payer: BC Managed Care – PPO | Admitting: Nurse Practitioner

## 2023-04-29 VITALS — BP 142/80 | HR 86 | Temp 97.9°F | Resp 16 | Ht 69.0 in | Wt 170.8 lb

## 2023-04-29 DIAGNOSIS — D649 Anemia, unspecified: Secondary | ICD-10-CM

## 2023-04-29 DIAGNOSIS — E782 Mixed hyperlipidemia: Secondary | ICD-10-CM

## 2023-04-29 DIAGNOSIS — I700 Atherosclerosis of aorta: Secondary | ICD-10-CM | POA: Diagnosis not present

## 2023-04-29 DIAGNOSIS — G4733 Obstructive sleep apnea (adult) (pediatric): Secondary | ICD-10-CM | POA: Diagnosis not present

## 2023-04-29 DIAGNOSIS — Z79899 Other long term (current) drug therapy: Secondary | ICD-10-CM

## 2023-04-29 DIAGNOSIS — R7303 Prediabetes: Secondary | ICD-10-CM | POA: Diagnosis not present

## 2023-04-29 DIAGNOSIS — I10 Essential (primary) hypertension: Secondary | ICD-10-CM

## 2023-04-29 DIAGNOSIS — E559 Vitamin D deficiency, unspecified: Secondary | ICD-10-CM

## 2023-04-29 DIAGNOSIS — K219 Gastro-esophageal reflux disease without esophagitis: Secondary | ICD-10-CM | POA: Diagnosis not present

## 2023-04-29 DIAGNOSIS — M25562 Pain in left knee: Secondary | ICD-10-CM

## 2023-04-29 LAB — CBC WITH DIFFERENTIAL/PLATELET
Eosinophils Relative: 4.7 %
Neutrophils Relative %: 63.4 %
RBC: 4.63 10*6/uL (ref 4.20–5.80)
RDW: 12.4 % (ref 11.0–15.0)

## 2023-04-29 NOTE — Patient Instructions (Signed)

## 2023-04-29 NOTE — Progress Notes (Signed)
FOLLOW UP  Assessment and Plan:   Essential hypertension Controlled  Discussed DASH (Dietary Approaches to Stop Hypertension) DASH diet is lower in sodium than a typical American diet. Cut back on foods that are high in saturated fat, cholesterol, and trans fats. Eat more whole-grain foods, fish, poultry, and nuts Remain active and exercise as tolerated daily.  Monitor BP at home-Call if greater than 130/80.   Aortic atherosclerosis (HCC) by Abd CTscan on 11/03/2020 Continue medications; Rosuvastatin Discussed lifestyle modifications. Recommended diet heavy in fruits and veggies, omega 3's. Decrease consumption of animal meats, cheeses, and dairy products. Remain active and exercise as tolerated. Continue to monitor.  OSA (obstructive sleep apnea) Refuses to wear CPAP Discussed risks of not wearing CPAP including increasing risk of long term health problems, diabetes, heart disease, stroke.  Gastroesophageal reflux disease, unspecified whether esophagitis present No suspected reflux complications (Barret/stricture). Lifestyle modification:  wt loss, avoid meals 2-3h before bedtime. Consider eliminating food triggers:  chocolate, caffeine, EtOH, acid/spicy food.   Left Knee Pain, unspecified chronicity NSAIDs/Tylenol prn Local analgesia with capsaicin/Voltaren topical Quadriceps-strengthening exercises. Knee brace. Maintain weight. Intra-articular corticosteroid injections PRN Continue to follow with Orthopedics. Awaiting MRI results.  Hyperlipidemia, mixed Continue medications; Rosuvastatin Discussed lifestyle modifications. Recommended diet heavy in fruits and veggies, omega 3's. Decrease consumption of animal meats, cheeses, and dairy products. Remain active and exercise as tolerated. Continue to monitor.  Prediabetes Education: Reviewed 'ABCs' of diabetes management  A1C (<7) Blood pressure (<130/80) Cholesterol (LDL <70) Continue Eye Exam yearly  Continue  Dental Exam Q6 mo Discussed dietary recommendations Discussed Physical Activity recommendations  Vitamin D deficiency Continue supplement for goal of 60-100 Monitor Vitamin D levels  Medication management All medications discussed and reviewed in full. All questions and concerns regarding medications addressed.    Low Hemoglobin Secondary to Left TKA 12/2022. Most likely resolve however will monitor CBC Check iron panel  Orders Placed This Encounter  Procedures   CBC with Differential/Platelet   COMPLETE METABOLIC PANEL WITH GFR   Lipid panel   Hemoglobin A1C w/out eAG   Iron, TIBC and Ferritin Panel    Notify office for further evaluation and treatment, questions or concerns if any reported s/s fail to improve.   The patient was advised to call back or seek an in-person evaluation if any symptoms worsen or if the condition fails to improve as anticipated.   Further disposition pending results of labs. Discussed med's effects and SE's.    [name] The patient was provided an opportunity to ask questions and all were answered. The patient agreed with the plan and demonstrated an understanding of the instructions.  Discussed med's effects and SE's. Screening labs and tests as requested with regular follow-up as recommended.  I provided 25 minutes of face-to-face time during this encounter including counseling, chart review, and critical decision making was preformed.  Today's Plan of Care is based on a patient-centered health care approach known as shared decision making - the decisions, tests and treatments allow for patient preferences and values to be balanced with clinical evidence.    Future Appointments  Date Time Provider Department Center  02/06/2024 11:00 AM [name], MD [facility] None     ----------------------------------------------------------------------------------------------------------------------  HPI 67 y.o. male  presents for 3 month follow up on hypertension, cholesterol, diabetes, weight and vitamin D deficiency.   He is s/p LTKA with Dr. Eulah Pont 12/2022.  Continues to heal well.  Has good ROM.  No longer doing PT.  Trying to get back to playing golf.   During his CPE 01/2023 he was noted to have low Hbg most likely r/t to LTKA.  He denies any current SOB, dizziness, syncope, fatigue.    BMI is Body mass index is 25.22 kg/m., he has been working on diet and exercise. Wt Readings from Last 3 Encounters:  04/29/23 170 lb 12.8 oz (77.5 kg)  01/28/23 167 lb (75.8 kg)  10/28/22 166 lb 12.8 oz (75.7 kg)    His blood pressure has been controlled at home, today their BP is BP: (!) 142/80  He does workout. He denies chest pain, shortness of breath, dizziness.   He is on cholesterol medication Rosuvastatin and denies myalgias. His cholesterol is at goal. The cholesterol last visit was:   Lab Results  Component Value Date   CHOL 144 01/28/2023   HDL 46 01/28/2023   LDLCALC 78 01/28/2023   TRIG 118 01/28/2023   CHOLHDL 3.1 01/28/2023    He has been working on diet and exercise for prediabetes, and denies polydipsia and polyuria. Last A1C in the office was:  Lab Results  Component Value Date   HGBA1C 6.1 (H) 01/28/2023   Patient is on Vitamin D supplement.   Lab Results  Component Value Date   VD25OH 71 01/28/2023        Current Medications:  Current Outpatient Medications on File Prior to Visit  Medication Sig   celecoxib (CELEBREX) 200 MG capsule Take 200 mg by mouth 2 (two) times daily as needed.   Cholecalciferol 5000 units capsule Take 1 capsule (5,000 Units total) by mouth daily.   rosuvastatin (CRESTOR) 20 MG tablet TAKE 1 TABLET BY MOUTH EVERY DAY FOR CHOLESTEROL   valsartan (DIOVAN) 80 MG tablet TAKE 1 TABLET DAILY FOR BLOOD PRESSURE   No  current facility-administered medications on file prior to visit.     Allergies: No Known Allergies   Medical History:  Past Medical History:  Diagnosis Date   Heart murmur    Hyperlipidemia    Hypertension    Shingles    Vitamin D deficiency    Family history- Reviewed and unchanged Social history- Reviewed and unchanged   Review of Systems:  ROS    Physical Exam: BP (!) 142/80   Pulse 86   Temp 97.9 F (36.6 C)   Resp 16   Ht 5\' 9"  (1.753 m)   Wt 170 lb 12.8 oz (77.5 kg)   SpO2 97%   BMI 25.22 kg/m  Wt Readings from Last 3 Encounters:  04/29/23 170 lb 12.8 oz (77.5 kg)  01/28/23 167 lb (75.8 kg)  10/28/22 166 lb 12.8 oz (75.7 kg)   General Appearance: Well nourished, in no apparent distress. Eyes: PERRLA, EOMs, conjunctiva no swelling or erythema Sinuses: No Frontal/maxillary tenderness ENT/Mouth: Ext aud canals clear, TMs without erythema, bulging. No erythema, swelling, or exudate on post pharynx.  Tonsils not swollen or erythematous. Hearing normal.  Neck: Supple, thyroid normal.  Respiratory: Respiratory effort normal, BS equal bilaterally without rales, rhonchi, wheezing or stridor.  Cardio: RRR with no MRGs. Brisk peripheral pulses without edema.  Abdomen: Soft, + BS.  Non tender, no guarding, rebound, hernias, masses. Lymphatics: Non tender without lymphadenopathy.  Musculoskeletal: Full ROM, 5/5 strength, Antalgic gait, favors left leg. Skin: Warm, dry without rashes, lesions, ecchymosis.  Neuro: Cranial nerves intact. No cerebellar symptoms.  Psych: Awake and oriented X 3, normal affect, Insight and Judgment appropriate.    [name] [name]  [name] Goertzen, NP 10:48 AM [facility]

## 2023-04-30 LAB — COMPLETE METABOLIC PANEL WITH GFR
AG Ratio: 1.8 (calc) (ref 1.0–2.5)
ALT: 11 U/L (ref 9–46)
AST: 13 U/L (ref 10–35)
Albumin: 4.4 g/dL (ref 3.6–5.1)
Alkaline phosphatase (APISO): 75 U/L (ref 35–144)
BUN: 21 mg/dL (ref 7–25)
CO2: 29 mmol/L (ref 20–32)
Calcium: 9.6 mg/dL (ref 8.6–10.3)
Chloride: 104 mmol/L (ref 98–110)
Creat: 0.73 mg/dL (ref 0.70–1.35)
Globulin: 2.5 g/dL (calc) (ref 1.9–3.7)
Glucose, Bld: 97 mg/dL (ref 65–99)
Potassium: 4.5 mmol/L (ref 3.5–5.3)
Sodium: 139 mmol/L (ref 135–146)
Total Bilirubin: 0.5 mg/dL (ref 0.2–1.2)
Total Protein: 6.9 g/dL (ref 6.1–8.1)
eGFR: 98 mL/min/{1.73_m2} (ref >=60)

## 2023-04-30 LAB — CBC WITH DIFFERENTIAL/PLATELET
Absolute Monocytes: 552 cells/uL (ref 200–950)
Basophils Absolute: 50 cells/uL (ref 0–200)
Basophils Relative: 0.8 %
Eosinophils Absolute: 291 cells/uL (ref 15–500)
HCT: 39.6 % (ref 38.5–50.0)
Hemoglobin: 13.2 g/dL (ref 13.2–17.1)
Lymphs Abs: 1376 cells/uL (ref 850–3900)
MCH: 28.5 pg (ref 27.0–33.0)
MCHC: 33.3 g/dL (ref 32.0–36.0)
MCV: 85.5 fL (ref 80.0–100.0)
MPV: 11.1 fL (ref 7.5–12.5)
Monocytes Relative: 8.9 %
Neutro Abs: 3931 cells/uL (ref 1500–7800)
Platelets: 259 10*3/uL (ref 140–400)
Total Lymphocyte: 22.2 %
WBC: 6.2 10*3/uL (ref 3.8–10.8)

## 2023-04-30 LAB — LIPID PANEL
Cholesterol: 143 mg/dL (ref <200)
HDL: 46 mg/dL (ref >=40)
LDL Cholesterol (Calc): 81 mg/dL (calc)
Non-HDL Cholesterol (Calc): 97 mg/dL (calc) (ref <130)
Total CHOL/HDL Ratio: 3.1 (calc) (ref <5.0)
Triglycerides: 75 mg/dL (ref <150)

## 2023-04-30 LAB — HEMOGLOBIN A1C W/OUT EAG: Hgb A1c MFr Bld: 6.4 % of total Hgb — ABNORMAL HIGH (ref <5.7)

## 2023-04-30 LAB — IRON,TIBC AND FERRITIN PANEL
%SAT: 28 % (calc) (ref 20–48)
Ferritin: 92 ng/mL (ref 24–380)
Iron: 95 ug/dL (ref 50–180)
TIBC: 335 mcg/dL (calc) (ref 250–425)

## 2023-05-19 ENCOUNTER — Other Ambulatory Visit: Payer: Self-pay | Admitting: Internal Medicine

## 2023-10-01 DIAGNOSIS — M25572 Pain in left ankle and joints of left foot: Secondary | ICD-10-CM | POA: Diagnosis not present

## 2023-10-12 ENCOUNTER — Other Ambulatory Visit: Payer: Self-pay | Admitting: Nurse Practitioner

## 2023-10-12 DIAGNOSIS — E782 Mixed hyperlipidemia: Secondary | ICD-10-CM

## 2023-10-20 DIAGNOSIS — M722 Plantar fascial fibromatosis: Secondary | ICD-10-CM | POA: Diagnosis not present

## 2023-10-22 ENCOUNTER — Encounter: Payer: Self-pay | Admitting: Nurse Practitioner

## 2023-10-22 ENCOUNTER — Ambulatory Visit (INDEPENDENT_AMBULATORY_CARE_PROVIDER_SITE_OTHER): Payer: BC Managed Care – PPO | Admitting: Nurse Practitioner

## 2023-10-22 VITALS — BP 150/90 | HR 84 | Temp 98.0°F | Ht 69.0 in | Wt 173.2 lb

## 2023-10-22 DIAGNOSIS — I10 Essential (primary) hypertension: Secondary | ICD-10-CM

## 2023-10-22 DIAGNOSIS — G4733 Obstructive sleep apnea (adult) (pediatric): Secondary | ICD-10-CM

## 2023-10-22 DIAGNOSIS — Z79899 Other long term (current) drug therapy: Secondary | ICD-10-CM

## 2023-10-22 DIAGNOSIS — I700 Atherosclerosis of aorta: Secondary | ICD-10-CM

## 2023-10-22 DIAGNOSIS — R7303 Prediabetes: Secondary | ICD-10-CM | POA: Diagnosis not present

## 2023-10-22 DIAGNOSIS — E782 Mixed hyperlipidemia: Secondary | ICD-10-CM

## 2023-10-22 DIAGNOSIS — E559 Vitamin D deficiency, unspecified: Secondary | ICD-10-CM

## 2023-10-22 DIAGNOSIS — M722 Plantar fascial fibromatosis: Secondary | ICD-10-CM

## 2023-10-22 DIAGNOSIS — K219 Gastro-esophageal reflux disease without esophagitis: Secondary | ICD-10-CM | POA: Diagnosis not present

## 2023-10-22 NOTE — Patient Instructions (Signed)
Plantar Fasciitis  Plantar fasciitis is a painful foot condition that affects the heel. It occurs when the band of tissue that connects the toes to the heel bone (plantar fascia) becomes irritated. This can happen as the result of exercising too much or doing other repetitive activities (overuse injury). Plantar fasciitis can cause mild irritation to severe pain that makes it difficult to walk or move. The pain is usually worse in the morning after sleeping, or after sitting or lying down for a period of time. Pain may also be worse after long periods of walking or standing. What are the causes? This condition may be caused by: Standing for long periods of time. Wearing shoes that do not have good arch support. Doing activities that put stress on joints (high-impact activities). This includes ballet and exercise that makes your heart beat faster (aerobic exercise), such as running. Being overweight. An abnormal way of walking (gait). Tight muscles in the back of your lower leg (calf). High arches in your feet or flat feet. Starting a new athletic activity. What are the signs or symptoms? The main symptom of this condition is heel pain. Pain may get worse after the following: Taking the first steps after a time of rest, especially in the morning after awakening, or after you have been sitting or lying down for a while. Long periods of standing still. Pain may decrease after 30-45 minutes of activity, such as gentle walking. How is this diagnosed? This condition may be diagnosed based on your medical history, a physical exam, and your symptoms. Your health care provider will check for: A tender area on the bottom of your foot. A high arch in your foot or flat feet. Pain when you move your foot. Difficulty moving your foot. You may have imaging tests to confirm the diagnosis, such as: X-rays. Ultrasound. MRI. How is this treated? Treatment for plantar fasciitis depends on how severe your  condition is. Treatment may include: Rest, ice, pressure (compression), and raising (elevating) the affected foot. This is called RICE therapy. Your health care provider may recommend RICE therapy along with over-the-counter pain medicines to manage your pain. Exercises to stretch your calves and your plantar fascia. A splint that holds your foot in a stretched, upward position while you sleep (night splint). Physical therapy to relieve symptoms and prevent problems in the future. Injections of steroid medicine (cortisone) to relieve pain and inflammation. Stimulating your plantar fascia with electrical impulses (extracorporeal shock wave therapy). This is usually the last treatment option before surgery. Surgery, if other treatments have not worked after 12 months. Follow these instructions at home: Managing pain, stiffness, and swelling  If directed, put ice on the painful area. To do this: Put ice in a plastic bag, or use a frozen bottle of water. Place a towel between your skin and the bag or bottle. Roll the bottom of your foot over the bag or bottle. Do this for 20 minutes, 2-3 times a day. Wear athletic shoes that have air-sole or gel-sole cushions, or try soft shoe inserts that are designed for plantar fasciitis. Elevate your foot above the level of your heart while you are sitting or lying down. Activity Avoid activities that cause pain. Ask your health care provider what activities are safe for you. Do physical therapy exercises and stretches as told by your health care provider. Try activities and forms of exercise that are easier on your joints (low impact). Examples include swimming, water aerobics, and biking. General instructions Take over-the-counter   and prescription medicines only as told by your health care provider. Wear a night splint while sleeping, if told by your health care provider. Loosen the splint if your toes tingle, become numb, or turn cold and blue. Maintain a  healthy weight, or work with your health care provider to lose weight as needed. Keep all follow-up visits. This is important. Contact a health care provider if you have: Symptoms that do not go away with home treatment. Pain that gets worse. Pain that affects your ability to move or do daily activities. Summary Plantar fasciitis is a painful foot condition that affects the heel. It occurs when the band of tissue that connects the toes to the heel bone (plantar fascia) becomes irritated. Heel pain is the main symptom of this condition. It may get worse after exercising too much or standing still for a long time. Treatment varies, but it usually starts with rest, ice, pressure (compression), and raising (elevating) the affected foot. This is called RICE therapy. Over-the-counter medicines can also be used to manage pain. This information is not intended to replace advice given to you by your health care provider. Make sure you discuss any questions you have with your health care provider. Document Revised: 01/17/2020 Document Reviewed: 01/17/2020 Elsevier Patient Education  2024 Elsevier Inc.  

## 2023-10-22 NOTE — Progress Notes (Signed)
 FOLLOW UP  Assessment and Plan:   Essential hypertension Elevated in clinic secondary to left plantar fasciitis pain.  Continue Valsartan   Discussed DASH (Dietary Approaches to Stop Hypertension) DASH diet is lower in sodium than a typical American diet. Cut back on foods that are high in saturated fat, cholesterol, and trans fats. Eat more whole-grain foods, fish, poultry, and nuts Remain active and exercise as tolerated daily.  Monitor BP at home-Call if greater than 130/80.   Aortic atherosclerosis (HCC) by Abd CTscan on 11/03/2020 Continue medications; Rosuvastatin  Discussed lifestyle modifications. Recommended diet heavy in fruits and veggies, omega 3's. Decrease consumption of animal meats, cheeses, and dairy products. Remain active and exercise as tolerated. Continue to monitor.  OSA (obstructive sleep apnea) Refuses to wear CPAP Discussed risks of not wearing CPAP including increasing risk of long term health problems, diabetes, heart disease, stroke.  Gastroesophageal reflux disease, unspecified whether esophagitis present No suspected reflux complications (Barret/stricture). Lifestyle modification:  wt loss, avoid meals 2-3h before bedtime. Consider eliminating food triggers:  chocolate, caffeine, EtOH, acid/spicy food.   Hyperlipidemia, mixed Continue medications; Rosuvastatin  Discussed lifestyle modifications. Recommended diet heavy in fruits and veggies, omega 3's. Decrease consumption of animal meats, cheeses, and dairy products. Remain active and exercise as tolerated. Continue to monitor.  Prediabetes Education: Reviewed 'ABCs' of diabetes management  A1C (<7) Blood pressure (<130/80) Cholesterol (LDL <70) Continue Eye Exam yearly  Continue Dental Exam Q6 mo Discussed dietary recommendations Discussed Physical Activity recommendations  Vitamin D  deficiency Continue supplement for goal of 60-100 Monitor Vitamin D  levels  Medication management All  medications discussed and reviewed in full. All questions and concerns regarding medications addressed.    Plantar Fasciitis (Left) Continue PT Discussed benefit of adding 1,000 mg Tylenol  up to QID Continue to follow with [name] [name]   Orders Placed This Encounter  Procedures   CBC with Differential/Platelet   COMPLETE METABOLIC PANEL WITH GFR   Lipid panel   Hemoglobin A1c   Notify office for further evaluation and treatment, questions or concerns if any reported s/s fail to improve.   The patient was advised to call back or seek an in-person evaluation if any symptoms worsen or if the condition fails to improve as anticipated.   Further disposition pending results of labs. Discussed med's effects and SE's.    [name] The patient was provided an opportunity to ask questions and all were answered. The patient agreed with the plan and demonstrated an understanding of the instructions.  Discussed med's effects and SE's. Screening labs and tests as requested with regular follow-up as recommended.  I provided 30 minutes of face-to-face time during this encounter including counseling, chart review, and critical decision making was preformed.  Today's Plan of Care is based on a patient-centered health care approach known as shared decision making - the decisions, tests and treatments allow for patient preferences and values to be balanced with clinical evidence.    Future Appointments  Date Time Provider Department Center  02/06/2024 11:00 AM [name] Fallow, MD [facility] None    ----------------------------------------------------------------------------------------------------------------------  HPI 68 y.o. male  presents for 3 month follow up on hypertension, cholesterol, diabetes, weight and vitamin D  deficiency.   He is s/p LTKA with [name] 12/2022.  Continues to heal well.  Has good ROM.  No longer doing PT.  Trying  to get back to playing golf.  However, has developed left foot plantar fasciitis.  He is currently attending  PT and being treated by [name] [name].  He has had Celebrex  prescribed but not taking as he reports it messes with my head.  He takes 800 mg Advil PRN which he feels is not effective.   BMI is Body mass index is 25.58 kg/m., he has been working on diet and exercise. Wt Readings from Last 3 Encounters:  10/22/23 173 lb 3.2 oz (78.6 kg)  04/29/23 170 lb 12.8 oz (77.5 kg)  01/28/23 167 lb (75.8 kg)    His blood pressure has been controlled at home, today their BP is BP: (!) 150/90 Otherwise controlled on Valsartan .  Patient is having pain in left heel d/t plantar fasciitis.    He does workout. He denies chest pain, shortness of breath, dizziness.  BP Readings from Last 3 Encounters:  10/22/23 (!) 150/90  04/29/23 (!) 142/80  01/28/23 130/70    He is on cholesterol medication Rosuvastatin  and denies myalgias. His cholesterol is at goal. The cholesterol last visit was:   Lab Results  Component Value Date   CHOL 143 04/29/2023   HDL 46 04/29/2023   LDLCALC 81 04/29/2023   TRIG 75 04/29/2023   CHOLHDL 3.1 04/29/2023    He has been working on diet and exercise for prediabetes, and denies polydipsia and polyuria. Last A1C in the office was:  Lab Results  Component Value Date   HGBA1C 6.4 (H) 04/29/2023   Patient is on Vitamin D  supplement.   Lab Results  Component Value Date   VD25OH 71 01/28/2023        Current Medications:  Current Outpatient Medications on File Prior to Visit  Medication Sig   Cholecalciferol  5000 units capsule Take 1 capsule (5,000 Units total) by mouth daily.   rosuvastatin  (CRESTOR ) 20 MG tablet TAKE 1 TABLET BY MOUTH EVERY DAY FOR CHOLESTEROL   valsartan  (DIOVAN ) 80 MG tablet TAKE 1 TABLET BY MOUTH EVERY DAY FOR BLOOD PRESSURE   celecoxib  (CELEBREX ) 200 MG capsule Take 200 mg by mouth 2 (two) times daily as needed. (Patient not taking: Reported on  10/22/2023)   No current facility-administered medications on file prior to visit.     Allergies: No Known Allergies   Medical History:  Past Medical History:  Diagnosis Date   Heart murmur    Hyperlipidemia    Hypertension    Shingles    Vitamin D  deficiency    Family history- Reviewed and unchanged Social history- Reviewed and unchanged   Review of Systems:  ROS    Physical Exam: BP (!) 150/90   Pulse 84   Temp 98 F (36.7 C)   Ht 5' 9 (1.753 m)   Wt 173 lb 3.2 oz (78.6 kg)   SpO2 99%   BMI 25.58 kg/m  Wt Readings from Last 3 Encounters:  10/22/23 173 lb 3.2 oz (78.6 kg)  04/29/23 170 lb 12.8 oz (77.5 kg)  01/28/23 167 lb (75.8 kg)   General Appearance: Well nourished, in no apparent distress. Eyes: PERRLA, EOMs, conjunctiva no swelling or erythema Sinuses: No Frontal/maxillary tenderness ENT/Mouth: Ext aud canals clear, TMs without erythema, bulging. No erythema, swelling, or exudate on post pharynx.  Tonsils not swollen or erythematous. Hearing normal.  Neck: Supple, thyroid  normal.  Respiratory: Respiratory effort normal, BS equal bilaterally without rales, rhonchi, wheezing or stridor.  Cardio: RRR with no MRGs. Brisk peripheral pulses without edema.  Abdomen: Soft, + BS.  Non tender, no guarding, rebound, hernias, masses. Lymphatics: Non tender without lymphadenopathy.  Musculoskeletal: Full ROM, 5/5  strength, Antalgic gait, favors left leg. Skin: Warm, dry without rashes, lesions, ecchymosis.  Neuro: Cranial nerves intact. No cerebellar symptoms.  Psych: Awake and oriented X 3, normal affect, Insight and Judgment appropriate.    BASCOM NECESSARY, NP 11:46 AM [facility]

## 2023-10-23 ENCOUNTER — Other Ambulatory Visit: Payer: Self-pay | Admitting: Nurse Practitioner

## 2023-10-23 DIAGNOSIS — T380X5A Adverse effect of glucocorticoids and synthetic analogues, initial encounter: Secondary | ICD-10-CM

## 2023-10-23 DIAGNOSIS — E099 Drug or chemical induced diabetes mellitus without complications: Secondary | ICD-10-CM | POA: Insufficient documentation

## 2023-10-23 LAB — CBC WITH DIFFERENTIAL/PLATELET
Absolute Lymphocytes: 1530 {cells}/uL (ref 850–3900)
Absolute Monocytes: 653 {cells}/uL (ref 200–950)
Basophils Absolute: 61 {cells}/uL (ref 0–200)
Basophils Relative: 0.9 %
Eosinophils Absolute: 401 {cells}/uL (ref 15–500)
Eosinophils Relative: 5.9 %
HCT: 42.1 % (ref 38.5–50.0)
Hemoglobin: 13.8 g/dL (ref 13.2–17.1)
MCH: 29.1 pg (ref 27.0–33.0)
MCHC: 32.8 g/dL (ref 32.0–36.0)
MCV: 88.8 fL (ref 80.0–100.0)
MPV: 11.3 fL (ref 7.5–12.5)
Monocytes Relative: 9.6 %
Neutro Abs: 4155 {cells}/uL (ref 1500–7800)
Neutrophils Relative %: 61.1 %
Platelets: 261 10*3/uL (ref 140–400)
RBC: 4.74 10*6/uL (ref 4.20–5.80)
RDW: 12.4 % (ref 11.0–15.0)
Total Lymphocyte: 22.5 %
WBC: 6.8 10*3/uL (ref 3.8–10.8)

## 2023-10-23 LAB — COMPLETE METABOLIC PANEL WITH GFR
AG Ratio: 1.8 (calc) (ref 1.0–2.5)
ALT: 15 U/L (ref 9–46)
AST: 16 U/L (ref 10–35)
Albumin: 4.3 g/dL (ref 3.6–5.1)
Alkaline phosphatase (APISO): 76 U/L (ref 35–144)
BUN: 18 mg/dL (ref 7–25)
CO2: 30 mmol/L (ref 20–32)
Calcium: 9.7 mg/dL (ref 8.6–10.3)
Chloride: 105 mmol/L (ref 98–110)
Creat: 0.85 mg/dL (ref 0.70–1.35)
Globulin: 2.4 g/dL (ref 1.9–3.7)
Glucose, Bld: 86 mg/dL (ref 65–99)
Potassium: 4.3 mmol/L (ref 3.5–5.3)
Sodium: 142 mmol/L (ref 135–146)
Total Bilirubin: 0.4 mg/dL (ref 0.2–1.2)
Total Protein: 6.7 g/dL (ref 6.1–8.1)
eGFR: 94 mL/min/{1.73_m2} (ref >=60)

## 2023-10-23 LAB — LIPID PANEL
Cholesterol: 138 mg/dL (ref <200)
HDL: 45 mg/dL (ref >=40)
LDL Cholesterol (Calc): 68 mg/dL
Non-HDL Cholesterol (Calc): 93 mg/dL (ref <130)
Total CHOL/HDL Ratio: 3.1 (calc) (ref <5.0)
Triglycerides: 167 mg/dL — ABNORMAL HIGH (ref <150)

## 2023-10-23 LAB — HEMOGLOBIN A1C
Hgb A1c MFr Bld: 6.4 %{Hb} — ABNORMAL HIGH (ref <5.7)
Mean Plasma Glucose: 137 mg/dL
eAG (mmol/L): 7.6 mmol/L

## 2023-10-23 MED ORDER — METFORMIN HCL ER 500 MG PO TB24: 500.0000 mg | ORAL_TABLET | Freq: Every day | ORAL | 0 refills | Status: DC

## 2023-10-29 DIAGNOSIS — M722 Plantar fascial fibromatosis: Secondary | ICD-10-CM | POA: Diagnosis not present

## 2023-11-11 DIAGNOSIS — M722 Plantar fascial fibromatosis: Secondary | ICD-10-CM | POA: Diagnosis not present

## 2023-11-21 ENCOUNTER — Encounter: Payer: Self-pay | Admitting: Family Medicine

## 2023-11-21 ENCOUNTER — Ambulatory Visit (INDEPENDENT_AMBULATORY_CARE_PROVIDER_SITE_OTHER): Payer: BC Managed Care – PPO | Admitting: Family Medicine

## 2023-11-21 VITALS — BP 154/82 | Ht 70.0 in | Wt 170.0 lb

## 2023-11-21 DIAGNOSIS — M722 Plantar fascial fibromatosis: Secondary | ICD-10-CM

## 2023-11-21 NOTE — Progress Notes (Addendum)
 HPI:  Patient presenting with left-sided plantar fasciitis.  Patient has pain over the plantar fascia of the arch as well as in the heel.  Patient states that he has been trying physical therapy notes some improvement but it is still bothering him.  Patient states that there is pain right now but it is not flared up but is not excruciating like it used to be.  Patient had ultrasound over at [facility] and was recommended to get orthotics here at sports medicine clinic.  Patient otherwise has no other concerns.  Physical exam: Bilateral feet shows pes cavus of the bilateral feet.  No signs of any loss of transverse arch or any hammering of the toes.  There is some tenderness to palpation over the plantar fascia of the arch on the left side as well as its insertion over the calcaneus.  Calcaneal squeeze test is positive.  Patient was fitted for a : Fit 'n Run semi-rigid orthotic  The orthotic was heated, placed on the orthotic stand. The patient was positioned in subtalar neutral position and 10 degrees of ankle dorsiflexion in a weight bearing stance on the heated orthotic blank Blank: Fit 'n Run - size 9 Posting:  none Base: none   Assessment and plan -Patient noted improvement from orthotics and states that he could feel stable as well as some comfortability over the arch.  Advised patient that he should be wearing this is much as possible and that he can come back for any changes if necessary.  Patient was understanding agreeable with plan.

## 2024-01-20 ENCOUNTER — Encounter: Payer: Self-pay | Admitting: Family Medicine

## 2024-01-20 ENCOUNTER — Ambulatory Visit: Payer: BC Managed Care – PPO | Admitting: Family Medicine

## 2024-01-20 ENCOUNTER — Ambulatory Visit (INDEPENDENT_AMBULATORY_CARE_PROVIDER_SITE_OTHER): Admitting: Family Medicine

## 2024-01-20 VITALS — BP 138/82 | HR 75 | Temp 97.5°F | Wt 179.8 lb

## 2024-01-20 DIAGNOSIS — R011 Cardiac murmur, unspecified: Secondary | ICD-10-CM | POA: Diagnosis not present

## 2024-01-20 DIAGNOSIS — R7303 Prediabetes: Secondary | ICD-10-CM

## 2024-01-20 DIAGNOSIS — E782 Mixed hyperlipidemia: Secondary | ICD-10-CM

## 2024-01-20 DIAGNOSIS — I700 Atherosclerosis of aorta: Secondary | ICD-10-CM

## 2024-01-20 DIAGNOSIS — Z8249 Family history of ischemic heart disease and other diseases of the circulatory system: Secondary | ICD-10-CM

## 2024-01-20 DIAGNOSIS — I10 Essential (primary) hypertension: Secondary | ICD-10-CM | POA: Diagnosis not present

## 2024-01-20 DIAGNOSIS — L570 Actinic keratosis: Secondary | ICD-10-CM | POA: Insufficient documentation

## 2024-01-20 DIAGNOSIS — M722 Plantar fascial fibromatosis: Secondary | ICD-10-CM | POA: Insufficient documentation

## 2024-01-20 NOTE — Progress Notes (Signed)
 Assessment/Plan:    Assessment & Plan Aortic Atherosclerosis Aortic atherosclerosis managed with rosuvastatin for cholesterol control, reducing risk of myocardial infarction and cerebrovascular accident. Blood pressure is well-controlled with valsartan.  Hypertension Blood pressure is well-controlled at 138/82 mmHg with valsartan 80 mg daily. He experiences discomfort if a dose is missed, but regular medication use prevents issues.  Tricuspid Valve Regurgitation Mild tricuspid valve regurgitation, identified in a 2015 echocardiogram, may be the source of a heart murmur.  Prediabetes Borderline blood glucose levels with Hemoglobin A1c around 6.4, close to diabetic threshold of 6.5. No evidence of diabetes and not taking metformin as previously prescribed. No need for medication initiation at this time.  Actinic Keratosis Scaly spots on the left ear and other areas suggest actinic keratosis, precancerous lesions. History of dermatological treatment for similar lesions. - Refer to dermatology for evaluation and management of actinic keratosis.  Plantar Fasciitis Plantar fasciitis previously managed with physical therapy and orthotics, currently well-controlled.  General Health Maintenance Good health for age, no chest pain or dyspnea. Taking vitamin D supplements as recommended.  Follow-up Establishing care with new provider, due for routine follow-up. - Schedule follow-up appointment in three months for routine check-up and fasting blood work.      Medications Discontinued During This Encounter  Medication Reason   celecoxib (CELEBREX) 200 MG capsule    metFORMIN (GLUCOPHAGE-XR) 500 MG 24 hr tablet     No follow-ups on file.    Subjective:   Encounter date: 01/20/2024  [name] [name] is a 68 y.o. 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   He  has a past medical history of Heart murmur, Hyperlipidemia, Hypertension, Shingles, and Vitamin D deficiency.Marland Kitchen   He presents with chief complaint of Establish Care (No concerns. Dr. Oneta Rack pt) .   Discussed the use of AI scribe software for clinical note transcription with the patient, who gave verbal consent to proceed.  History of Present Illness [name] [name] "BUDDY" is a 68 year old male who presents for establishing care.  He has a history of aortic atherosclerosis and has been on rosuvastatin for cholesterol management for many years. He takes valsartan 80 mg daily for blood pressure control. If he misses a dose, he experiences some discomfort, but when he takes it regularly, he feels fine.  He has a history of being borderline diabetic with hemoglobin A1c levels around 6.4, but he has never been diagnosed with diabetes. He recalls being told about a possible steroid-induced diabetes due to cortisone injections received for knee issues, but he has never taken metformin or any diabetes medication.  He has a history of plantar fasciitis, which was diagnosed after knee replacement surgery. He underwent physical therapy and received orthotics, which have helped manage the pain. The pain is better controlled now.  He has a significant surgical history including bilateral knee replacements, with the right knee replaced approximately four to five years ago and the left knee about a year ago. He also had meniscus surgeries on both knees prior to the replacements. He reports a history of shoulder surgery and double hernia  surgery.  He has a family history  of heart disease and cancer; his father died of a massive heart attack, and his mother had cancer twice and passed away from it. He has a heart murmur, which he has had for many years, possibly since birth.  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 No chest pain or shortness of breath.       Past Surgical History:  Procedure Laterality Date   ARTHROSCOPY WITH ANTERIOR CRUCIATE LIGAMENT (ACL) REPAIR WITH ANTERIOR TIBILIAS GRAFT Right    HERNIA REPAIR  2018   Double hernia repair   INGUINAL HERNIA REPAIR Bilateral 09/28/2015   Procedure: LAPAROSCOPIC BILATERAL INGUINAL HERNIA REPAIR;  Surgeon: Gaynelle Adu, MD;  Location: [facility];  Service: General;  Laterality: Bilateral;   INSERTION OF MESH Bilateral 09/28/2015   Procedure: INSERTION OF MESH;  Surgeon: Gaynelle Adu, MD;  Location: [facility];  Service: General;  Laterality: Bilateral;   JOINT REPLACEMENT  2019 & 2024   Both knee replacements   SHOULDER ARTHROSCOPY     TOTAL KNEE ARTHROPLASTY Right 01/11/2020   Procedure: TOTAL KNEE ARTHROPLASTY;  Surgeon: Sheral Apley, MD;  Location: [facility];  Service: Orthopedics;  Laterality: Right;    Outpatient Medications Prior to Visit  Medication Sig Dispense Refill   Cholecalciferol 5000 units capsule Take 1 capsule (5,000 Units total) by mouth daily. 1000 capsule 99   rosuvastatin (CRESTOR) 20 MG tablet TAKE 1 TABLET BY MOUTH EVERY DAY FOR CHOLESTEROL 90 tablet 3   valsartan (DIOVAN) 80 MG tablet TAKE 1 TABLET BY MOUTH EVERY DAY FOR BLOOD PRESSURE 90 tablet 3   celecoxib (CELEBREX) 200 MG capsule Take 200 mg by mouth 2 (two) times daily as needed. (Patient not taking: Reported on 10/22/2023)     metFORMIN (GLUCOPHAGE-XR) 500 MG 24 hr tablet Take 1 tablet (500 mg total) by mouth daily with  breakfast. Take 2 tablets 2 x /day with Meals for Diabetes. 90 tablet 0   No facility-administered medications prior to visit.    Family History  Problem Relation Age of Onset   Cancer Mother        colon, lung   Heart disease Father    Hyperlipidemia Father    Heart attack Father     Social History   Socioeconomic History   Marital status: Divorced    Spouse name: Not on file   Number of children: Not on file   Years of education: Not on file   Highest education level: 12th grade  Occupational History   Not on file  Tobacco Use   Smoking status: Never   Smokeless tobacco: Never   Tobacco comments:    Never smoked  Vaping Use   Vaping status: Never Used  Substance and Sexual Activity   Alcohol use: Yes    Comment: OCCASIONAL BEER    Drug use: No   Sexual activity: Not on file  Other Topics Concern   Not on file  Social History Narrative   Not on file   Social Drivers of Health   Financial Resource Strain: Low Risk  (01/16/2024)   Overall Financial Resource Strain (CARDIA)    Difficulty of Paying Living Expenses: Not hard at all  Food Insecurity: No Food Insecurity (01/16/2024)   Hunger Vital Sign    Worried About Running Out of Food in the Last Year: Never true  Ran Out of Food in the Last Year: Never true  Transportation Needs: No Transportation Needs (01/16/2024)   PRAPARE - Administrator, Civil Service (Medical): No    Lack of Transportation (Non-Medical): No  Physical Activity: Insufficiently Active (01/16/2024)   Exercise Vital Sign    Days of Exercise per Week: 3 days    Minutes of Exercise per Session: 30 min  Stress: No Stress Concern Present (01/16/2024)   [employer] of Occupational Health - Occupational Stress Questionnaire    Feeling of Stress : Only a little  Social Connections: Unknown (01/16/2024)   Social Connection and Isolation Panel [NHANES]    Frequency of Communication with Friends and Family: More than three times a week     Frequency of Social Gatherings with Friends and Family: More than three times a week    Attends Religious Services: Patient declined    Database administrator or Organizations: No    Attends Engineer, structural: Not on file    Marital Status: Divorced  Catering manager Violence: Not on file                                                                                                  Objective:  Physical Exam: BP 138/82   Pulse 75   Temp (!) 97.5 F (36.4 C) (Temporal)   Wt 179 lb 12.8 oz (81.6 kg)   SpO2 99%   BMI 25.80 kg/m     Physical Exam Constitutional:      Appearance: Normal appearance.  HENT:     Head: Normocephalic and atraumatic.     Right Ear: Hearing normal.     Left Ear: Hearing normal.     Nose: Nose normal.  Eyes:     General: No scleral icterus.       Right eye: No discharge.        Left eye: No discharge.     Extraocular Movements: Extraocular movements intact.  Cardiovascular:     Rate and Rhythm: Normal rate and regular rhythm.     Heart sounds: Murmur heard.  Pulmonary:     Effort: Pulmonary effort is normal.     Breath sounds: Normal breath sounds.  Abdominal:     Palpations: Abdomen is soft.     Tenderness: There is no abdominal tenderness.  Skin:    General: Skin is warm.     Findings: Lesion (left ear) and rash present. Rash is scaling.  Neurological:     General: No focal deficit present.     Mental Status: He is alert.     Cranial Nerves: No cranial nerve deficit.  Psychiatric:        Mood and Affect: Mood normal.        Behavior: Behavior normal.        Thought Content: Thought content normal.        Judgment: Judgment normal.     No results found.  No results found for this or any previous visit (from the past 2160 hours).      Garner Nash, MD,  MS

## 2024-01-20 NOTE — Patient Instructions (Signed)
 VISIT SUMMARY:  You came in today to establish care. We reviewed your medical history, including your aortic atherosclerosis, hypertension, prediabetes, and plantar fasciitis. We also discussed your history of surgeries and family history of heart disease and cancer. You are currently managing your conditions well with medications and lifestyle adjustments.  YOUR PLAN:  -AORTIC ATHEROSCLEROSIS: Aortic atherosclerosis is the buildup of fats, cholesterol, and other substances in and on the artery walls, which can restrict blood flow. You are managing this condition with rosuvastatin to control your cholesterol levels, which helps reduce the risk of heart attack and stroke.  -HYPERTENSION: Hypertension, or high blood pressure, is when the force of the blood against your artery walls is too high. Your blood pressure is well-controlled with valsartan 80 mg daily. It's important to take your medication regularly to avoid discomfort.  -TRICUSPID VALVE REGURGITATION: Tricuspid valve regurgitation is a condition where the valve between the right atrium and right ventricle of the heart doesn't close properly, causing blood to flow backward. This may be the source of your heart murmur. No immediate treatment is needed at this time.  -PREDIABETES: Prediabetes means your blood sugar levels are higher than normal but not yet high enough to be diagnosed as diabetes. Your Hemoglobin A1c is around 6.4, close to the diabetic threshold of 6.5. No medication is needed at this time, but it's important to monitor your blood sugar levels.  -ACTINIC KERATOSIS: Actinic keratosis is a rough, scaly patch on your skin that develops from years of sun exposure and can be precancerous. You have scaly spots on your left ear and other areas. We will refer you to dermatology for further evaluation and management.  -PLANTAR FASCIITIS: Plantar fasciitis is inflammation of the tissue along the bottom of your foot that connects your heel  bone to your toes. Your condition is currently well-controlled with physical therapy and orthotics.  -GENERAL HEALTH MAINTENANCE: You are in good health for your age with no chest pain or shortness of breath. Continue taking vitamin D supplements as recommended.  INSTRUCTIONS:  Please schedule a follow-up appointment in three months for a routine check-up and fasting blood work.

## 2024-02-06 ENCOUNTER — Encounter: Payer: BC Managed Care – PPO | Admitting: Internal Medicine

## 2024-02-09 ENCOUNTER — Telehealth: Payer: Self-pay

## 2024-02-09 NOTE — Telephone Encounter (Signed)
 Copied from CRM [id number]. Topic: Referral - Status >> Feb 09, 2024  3:21 PM [place] wrote: Reason for CRM: Patient was seen on 4/8 and referred to a dermatologist and they have not contacted the Patient yet

## 2024-03-15 DIAGNOSIS — D1801 Hemangioma of skin and subcutaneous tissue: Secondary | ICD-10-CM | POA: Diagnosis not present

## 2024-03-15 DIAGNOSIS — L821 Other seborrheic keratosis: Secondary | ICD-10-CM | POA: Diagnosis not present

## 2024-03-15 DIAGNOSIS — L814 Other melanin hyperpigmentation: Secondary | ICD-10-CM | POA: Diagnosis not present

## 2024-04-20 ENCOUNTER — Ambulatory Visit: Admitting: Family Medicine

## 2024-04-20 ENCOUNTER — Encounter: Payer: Self-pay | Admitting: Family Medicine

## 2024-04-20 VITALS — BP 158/84 | HR 77 | Temp 98.2°F | Ht 70.0 in | Wt 178.4 lb

## 2024-04-20 DIAGNOSIS — N401 Enlarged prostate with lower urinary tract symptoms: Secondary | ICD-10-CM

## 2024-04-20 DIAGNOSIS — R7309 Other abnormal glucose: Secondary | ICD-10-CM

## 2024-04-20 DIAGNOSIS — N138 Other obstructive and reflux uropathy: Secondary | ICD-10-CM

## 2024-04-20 DIAGNOSIS — R7303 Prediabetes: Secondary | ICD-10-CM

## 2024-04-20 DIAGNOSIS — Z0000 Encounter for general adult medical examination without abnormal findings: Secondary | ICD-10-CM

## 2024-04-20 DIAGNOSIS — E559 Vitamin D deficiency, unspecified: Secondary | ICD-10-CM

## 2024-04-20 DIAGNOSIS — I10 Essential (primary) hypertension: Secondary | ICD-10-CM | POA: Diagnosis not present

## 2024-04-20 DIAGNOSIS — I700 Atherosclerosis of aorta: Secondary | ICD-10-CM | POA: Diagnosis not present

## 2024-04-20 DIAGNOSIS — Z79899 Other long term (current) drug therapy: Secondary | ICD-10-CM | POA: Diagnosis not present

## 2024-04-20 DIAGNOSIS — E782 Mixed hyperlipidemia: Secondary | ICD-10-CM | POA: Diagnosis not present

## 2024-04-20 DIAGNOSIS — R42 Dizziness and giddiness: Secondary | ICD-10-CM

## 2024-04-20 DIAGNOSIS — D649 Anemia, unspecified: Secondary | ICD-10-CM

## 2024-04-20 DIAGNOSIS — M722 Plantar fascial fibromatosis: Secondary | ICD-10-CM

## 2024-04-20 DIAGNOSIS — D1801 Hemangioma of skin and subcutaneous tissue: Secondary | ICD-10-CM

## 2024-04-20 DIAGNOSIS — G4733 Obstructive sleep apnea (adult) (pediatric): Secondary | ICD-10-CM

## 2024-04-20 DIAGNOSIS — R238 Other skin changes: Secondary | ICD-10-CM | POA: Insufficient documentation

## 2024-04-20 LAB — VITAMIN D 25 HYDROXY (VIT D DEFICIENCY, FRACTURES): VITD: 54.71 ng/mL (ref 30.00–100.00)

## 2024-04-20 LAB — LIPID PANEL
Cholesterol: 170 mg/dL (ref 0–200)
HDL: 52.3 mg/dL (ref >39.00)
LDL Cholesterol: 97 mg/dL (ref 0–99)
NonHDL: 117.82
Total CHOL/HDL Ratio: 3
Triglycerides: 106 mg/dL (ref 0.0–149.0)
VLDL: 21.2 mg/dL (ref 0.0–40.0)

## 2024-04-20 LAB — CBC WITH DIFFERENTIAL/PLATELET
Basophils Absolute: 0.1 K/uL (ref 0.0–0.1)
Basophils Relative: 0.9 % (ref 0.0–3.0)
Eosinophils Absolute: 0.3 K/uL (ref 0.0–0.7)
Eosinophils Relative: 4.7 % (ref 0.0–5.0)
HCT: 42.6 % (ref 39.0–52.0)
Hemoglobin: 14.3 g/dL (ref 13.0–17.0)
Lymphocytes Relative: 25.6 % (ref 12.0–46.0)
Lymphs Abs: 1.7 K/uL (ref 0.7–4.0)
MCHC: 33.5 g/dL (ref 30.0–36.0)
MCV: 86.8 fl (ref 78.0–100.0)
Monocytes Absolute: 0.4 K/uL (ref 0.1–1.0)
Monocytes Relative: 6.9 % (ref 3.0–12.0)
Neutro Abs: 4 K/uL (ref 1.4–7.7)
Neutrophils Relative %: 61.9 % (ref 43.0–77.0)
Platelets: 218 K/uL (ref 150.0–400.0)
RBC: 4.9 Mil/uL (ref 4.22–5.81)
RDW: 14 % (ref 11.5–15.5)
WBC: 6.5 K/uL (ref 4.0–10.5)

## 2024-04-20 LAB — HEMOGLOBIN A1C: Hgb A1c MFr Bld: 6.5 % (ref 4.6–6.5)

## 2024-04-20 LAB — PSA: PSA: 3.97 ng/mL (ref 0.10–4.00)

## 2024-04-20 LAB — TSH: TSH: 2.37 u[IU]/mL (ref 0.35–5.50)

## 2024-04-20 NOTE — Progress Notes (Signed)
 Assessment  Assessment/Plan:  Assessment and Plan Assessment & Plan Hypertension Blood pressure is elevated at 158/84 mmHg. The target is to maintain blood pressure under 130/80 mmHg to reduce cardiac risk. He has been on valsartan  long-term. Discussed home blood pressure monitoring and potential medication adjustment if hypertension persists. - Instruct to purchase a home blood pressure monitor, preferably an arm cuff, and check blood pressure at home, ideally an hour after medication. - Schedule follow-up in one month to review home blood pressure readings and consider medication adjustment.  Aortic Atherosclerosis Aortic atherosclerosis managed with rosuvastatin  for cholesterol control. No acute issues. - Continue rosuvastatin  20 mg for cholesterol management. - Order fasting lipid panel to monitor hyperlipidemia. - Order metabolic panel to assess liver function due to statin use.  Tricuspid Valve Regurgitation Tricuspid valve regurgitation with a well-managed slight murmur. No acute symptoms.  Prediabetes Prediabetes managed with dietary measures. No acute issues. - Order hemoglobin A1c and fasting blood sugar to monitor prediabetes. - Order random insulin  level.  Plantar Fasciitis Plantar fasciitis managed with physical therapy and orthotics. No acute issues.  Vitamin D  Deficiency Vitamin D  deficiency managed with 5000 IU daily supplementation. Discussed vitamin D 's role in calcium  absorption and bone health. Plan to reassess vitamin D  levels to determine if supplementation is still necessary. - Order vitamin D  level to assess current status.  Orthostatic Dizziness Reports occasional dizziness upon standing, possibly due to mild orthostasis. Advised to stay hydrated, especially in the heat. - Advise to maintain adequate hydration.  Cherry Hemangiomas and Papules Several cherry hemangiomas and small itchy papules. Dermatology assessed as normal. Advised to use topical  hydrocortisone for itchiness. - Recommend over-the-counter topical hydrocortisone for itchy papules.  BPH with out LUTS Check PSA. Monitor symptoms.   OSA  Controlled on CPAP  General Health Maintenance Due for routine screenings and lab tests. - Order CBC to assess hematologic status. - Order iron studies including iron, TIBC, and ferritin due to history of mild iron deficiency. - Order thyroid  function tests due to history of hypertension. - Order urinalysis and microalbumin/creatinine ratio for hypertension monitoring. - Order PSA for prostate cancer screening.  Follow-up Plan to monitor blood pressure and review lab results. - Schedule follow-up in one month to review blood pressure readings and lab results.     There are no discontinued medications.  Patient Counseling(The following topics were reviewed and/or handout was given):  -Nutrition: Stressed importance of moderation in sodium/caffeine intake, saturated fat and cholesterol, caloric balance, sufficient intake of fresh fruits, vegetables, and fiber.  -Stressed the importance of regular exercise.   -Substance Abuse: Discussed cessation/primary prevention of tobacco, alcohol, or other drug use; driving or other dangerous activities under the influence; availability of treatment for abuse.   -Injury prevention: Discussed safety belts, safety helmets, smoke detector, smoking near bedding or upholstery.   -Sexuality: Discussed sexually transmitted diseases, partner selection, use of condoms, avoidance of unintended pregnancy and contraceptive alternatives.   -Dental health: Discussed importance of regular tooth brushing, flossing, and dental visits.  -Health maintenance and immunizations reviewed. Please refer to Health maintenance section.  Return in about 1 month (around 05/21/2024) for BP.        Subjective:   Encounter date: 04/20/2024  Chief Complaint  Patient presents with   Annual Exam    Physical; no  concerns    Discussed the use of AI scribe software for clinical note transcription with the patient, who gave verbal consent to proceed.  History of Present Illness  [name] [name] BUDDY is a 68 year old male with hypertension and prediabetes who presents for a follow-up physical and preventative check-up.  He experiences occasional dizziness when standing up, occurring about once a month, without associated headaches or worsening symptoms. No chest pain or shortness of breath. His blood pressure was elevated today at 158/84 mmHg, and he does not check his blood pressure at home. Hypertension is managed with valsartan .  He has a history of aortic atherosclerosis, managed with rosuvastatin  20 mg daily for cholesterol management. He also has a history of tricuspid valve regurgitation, which has been stable.  His prediabetes is managed with dietary measures. He is due for lab work, including a repeat hemoglobin A1c and fasting lipid panel.  He has a history of plantar fasciitis, managed with physical therapy and orthotics, and a history of vitamin D  deficiency, for which he takes vitamin D  supplements.  He has small, itchy papules on his body, described as 'like a bug bite'. These have been assessed by dermatology and were deemed normal. No pain associated with these lesions.       04/20/2024    8:25 AM 01/20/2024    2:23 PM 01/27/2023   10:58 PM 07/26/2022   12:10 AM 01/24/2022   12:24 AM  Depression screen PHQ 2/9  Decreased Interest 0 0 0 0 0  Down, Depressed, Hopeless 0 0 0 0 0  PHQ - 2 Score 0 0 0 0 0  Altered sleeping 0 0     Tired, decreased energy 0 1     Change in appetite 0 0     Feeling bad or failure about yourself  0 0     Trouble concentrating 0 0     Moving slowly or fidgety/restless 0 0     Suicidal thoughts 0 0     PHQ-9 Score 0 1     Difficult doing work/chores Not difficult at all Not difficult at all          04/20/2024    8:25 AM 01/20/2024    2:23 PM  GAD  7 : Generalized Anxiety Score  Nervous, Anxious, on Edge 0 0  Control/stop worrying 0 0  Worry too much - different things 0 0  Trouble relaxing 0 0  Restless 0 0  Easily annoyed or irritable 0 0  Afraid - awful might happen 0 0  Total GAD 7 Score 0 0  Anxiety Difficulty Not difficult at all Not difficult at all    Health Maintenance Due  Topic Date Due   Hepatitis C Screening  Never done   COVID-19 Vaccine (5 - 2024-25 season) 06/15/2023   Diabetic kidney evaluation - Urine ACR  01/28/2024       PMH:  The following were reviewed and entered/updated in epic: Past Medical History:  Diagnosis Date   Heart murmur    Hyperlipidemia    Hypertension    Shingles    Vitamin D  deficiency     Patient Active Problem List   Diagnosis Date Noted   Low hemoglobin 04/20/2024   Papule of skin 04/20/2024   Cherry hemangioma 04/20/2024   Orthostatic dizziness 04/20/2024   Plantar fascial fibromatosis of both feet 01/20/2024   AK (actinic keratosis) 01/20/2024   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) 10/23/2023   BPH with obstruction/lower urinary tract symptoms 04/16/2021   Abnormal glucose 04/16/2021   Screening for AAA (aortic abdominal aneurysm) 04/16/2021   Aortic atherosclerosis (HCC) by Abd CTscan on  11/03/2020 04/09/2021   Acid reflux 05/22/2020   Primary osteoarthritis of right knee 01/11/2020   Fatigue 10/12/2018   BMI 25.0-25.9,adult 12/02/2017   Prediabetes 12/02/2017   Vitamin D  deficiency 12/02/2017   OSA (obstructive sleep apnea) 12/02/2017   Noncompliance 11/12/2014   Heart murmur 02/18/2014   Essential hypertension 02/18/2014   FHx: heart disease 02/18/2014   Hyperlipidemia, mixed     Past Surgical History:  Procedure Laterality Date   ARTHROSCOPY WITH ANTERIOR CRUCIATE LIGAMENT (ACL) REPAIR WITH ANTERIOR TIBILIAS GRAFT Right    HERNIA REPAIR  2018   Double hernia repair   INGUINAL HERNIA REPAIR Bilateral 09/28/2015    Procedure: LAPAROSCOPIC BILATERAL INGUINAL HERNIA REPAIR;  Surgeon: Camellia Blush, MD;  Location: [facility];  Service: General;  Laterality: Bilateral;   INSERTION OF MESH Bilateral 09/28/2015   Procedure: INSERTION OF MESH;  Surgeon: Camellia Blush, MD;  Location: [facility];  Service: General;  Laterality: Bilateral;   JOINT REPLACEMENT  2019 & 2024   Both knee replacements   SHOULDER ARTHROSCOPY     TOTAL KNEE ARTHROPLASTY Right 01/11/2020   Procedure: TOTAL KNEE ARTHROPLASTY;  Surgeon: Beverley Evalene BIRCH, MD;  Location: [facility];  Service: Orthopedics;  Laterality: Right;    Family History  Problem Relation Age of Onset   Cancer Mother        colon, lung   Heart disease Father    Hyperlipidemia Father    Heart attack Father     Medications- reviewed and updated Outpatient Medications Prior to Visit  Medication Sig Dispense Refill   Cholecalciferol  5000 units capsule Take 1 capsule (5,000 Units total) by mouth daily. 1000 capsule 99   rosuvastatin  (CRESTOR ) 20 MG tablet TAKE 1 TABLET BY MOUTH EVERY DAY FOR CHOLESTEROL 90 tablet 3   valsartan  (DIOVAN ) 80 MG tablet TAKE 1 TABLET BY MOUTH EVERY DAY FOR BLOOD PRESSURE 90 tablet 3   No facility-administered medications prior to visit.    No Known Allergies  Social History   Socioeconomic History   Marital status: Divorced    Spouse name: Not on file   Number of children: Not on file   Years of education: Not on file   Highest education level: 12th grade  Occupational History   Not on file  Tobacco Use   Smoking status: Never   Smokeless tobacco: Never   Tobacco comments:    Never smoked  Vaping Use   Vaping status: Never Used  Substance and Sexual Activity   Alcohol use: Yes    Comment: OCCASIONAL BEER    Drug use: No   Sexual activity: Not on file  Other Topics Concern   Not on file  Social History Narrative   Not on file   Social Drivers of Health   Financial Resource Strain: Low Risk  (04/16/2024)   Overall Financial  Resource Strain (CARDIA)    Difficulty of Paying Living Expenses: Not hard at all  Food Insecurity: No Food Insecurity (04/16/2024)   Hunger Vital Sign    Worried About Running Out of Food in the Last Year: Never true    Ran Out of Food in the Last Year: Never true  Transportation Needs: No Transportation Needs (04/16/2024)   PRAPARE - Administrator, Civil Service (Medical): No    Lack of Transportation (Non-Medical): No  Physical Activity: Insufficiently Active (04/16/2024)   Exercise Vital Sign    Days of Exercise per Week: 3 days    Minutes of Exercise per Session: 40  min  Stress: No Stress Concern Present (04/16/2024)   [employer] of Occupational Health - Occupational Stress Questionnaire    Feeling of Stress: Not at all  Social Connections: Socially Isolated (04/16/2024)   Social Connection and Isolation Panel    Frequency of Communication with Friends and Family: More than three times a week    Frequency of Social Gatherings with Friends and Family: Twice a week    Attends Religious Services: Patient declined    Database administrator or Organizations: No    Attends Engineer, structural: Not on file    Marital Status: Divorced           Objective:  Physical Exam: BP (!) 158/84   Pulse 77   Temp 98.2 F (36.8 C)   Ht 5' 10 (1.778 m)   Wt 178 lb 6.4 oz (80.9 kg)   SpO2 95%   BMI 25.60 kg/m   Body mass index is 25.6 kg/m. Wt Readings from Last 3 Encounters:  04/20/24 178 lb 6.4 oz (80.9 kg)  01/20/24 179 lb 12.8 oz (81.6 kg)  11/21/23 170 lb (77.1 kg)    Physical Exam VITALS: BP- 158/84 GENERAL: Alert, cooperative, well developed, no acute distress, normal appearance. HEENT: Normocephalic, normal oropharynx, moist mucous membranes, ears normal bilaterally, PERRLA, no oropharyngeal lesions. NECK: No thyroid  nodules. CHEST: Clear to auscultation bilaterally, no wheezes, rhonchi, or crackles. CARDIOVASCULAR: Normal heart rate and rhythm, S1  and S2 normal without murmurs, heart exam stable. ABDOMEN: Soft, non-tender, non-distended, without organomegaly, normal bowel sounds. EXTREMITIES: No cyanosis or edema. NEUROLOGICAL: Cranial nerves grossly intact, moves all extremities without gross motor or sensory deficit. SKIN: Cherry hemangiomas scattered across the body, itchy papules on left knee, posterior left arm, and right lateral distal thigh.        Prior labs:   No results found for this or any previous visit (from the past 2160 hours).  Lab Results  Component Value Date   CHOL 138 10/22/2023   CHOL 143 04/29/2023   CHOL 144 01/28/2023   Lab Results  Component Value Date   HDL 45 10/22/2023   HDL 46 04/29/2023   HDL 46 01/28/2023   Lab Results  Component Value Date   LDLCALC 68 10/22/2023   LDLCALC 81 04/29/2023   LDLCALC 78 01/28/2023   Lab Results  Component Value Date   TRIG 167 (H) 10/22/2023   TRIG 75 04/29/2023   TRIG 118 01/28/2023   Lab Results  Component Value Date   CHOLHDL 3.1 10/22/2023   CHOLHDL 3.1 04/29/2023   CHOLHDL 3.1 01/28/2023   No results found for: LDLDIRECT  Last metabolic panel Lab Results  Component Value Date   GLUCOSE 86 10/22/2023   NA 142 10/22/2023   K 4.3 10/22/2023   CL 105 10/22/2023   CO2 30 10/22/2023   BUN 18 10/22/2023   CREATININE 0.85 10/22/2023   EGFR 94 10/22/2023   CALCIUM  9.7 10/22/2023   PROT 6.7 10/22/2023   ALBUMIN 4.0 12/31/2019   BILITOT 0.4 10/22/2023   ALKPHOS 54 12/31/2019   AST 16 10/22/2023   ALT 15 10/22/2023   ANIONGAP 5 12/31/2019    Lab Results  Component Value Date   HGBA1C 6.4 (H) 10/22/2023    Last CBC Lab Results  Component Value Date   WBC 6.8 10/22/2023   HGB 13.8 10/22/2023   HCT 42.1 10/22/2023   MCV 88.8 10/22/2023   MCH 29.1 10/22/2023   RDW 12.4 10/22/2023  PLT 261 10/22/2023    Lab Results  Component Value Date   TSH 2.20 01/28/2023    Lab Results  Component Value Date   PSA 4.00 01/28/2023    PSA 3.11 01/24/2022   PSA 2.7 02/17/2020    Last vitamin D  Lab Results  Component Value Date   VD25OH 71 01/28/2023    Lab Results  Component Value Date   BILIRUBINUR NEGATIVE 05/01/2016   PROTEINUR NEGATIVE 01/28/2023   UROBILINOGEN 0.2 05/01/2015   LEUKOCYTESUR NEGATIVE 01/28/2023    Lab Results  Component Value Date   MICROALBUR 0.8 01/28/2023   MICROALBUR 2.1 01/24/2022     At today's visit, we discussed treatment options, associated risk and benefits, and engage in counseling as needed.  Additionally the following were reviewed: Past medical records, past medical and surgical history, family and social background, as well as relevant laboratory results, imaging findings, and specialty notes, where applicable.  This message was generated using dictation software, and as a result, it may contain unintentional typos or errors.  Nevertheless, extensive effort was made to accurately convey at the pertinent aspects of the patient visit.    There may have been are other unrelated non-urgent complaints, but due to the busy schedule and the amount of time already spent with him, time does not permit to address these issues at today's visit. Another appointment may have or has been requested to review these additional issues.     Arvella Hummer, MD, MS

## 2024-04-20 NOTE — Patient Instructions (Addendum)
 VISIT SUMMARY: You came in today for a follow-up physical and preventative check-up. We discussed your hypertension, prediabetes, aortic atherosclerosis, tricuspid valve regurgitation, plantar fasciitis, vitamin D  deficiency, occasional dizziness, and skin lesions. We also reviewed your general health maintenance and planned for routine screenings and lab tests.  YOUR PLAN: -HYPERTENSION: Your blood pressure was elevated today at 158/84 mmHg. Hypertension means high blood pressure, which can increase your risk of heart disease. We recommend you purchase a home blood pressure monitor, preferably an arm cuff, and check your blood pressure at home, ideally an hour after taking your medication. We will review your home readings in one month and consider adjusting your medication if needed.  -AORTIC ATHEROSCLEROSIS: Aortic atherosclerosis is the buildup of plaque in the aorta, which can restrict blood flow. You are currently managing this with rosuvastatin  20 mg daily. We will continue this medication and have ordered a fasting lipid panel to monitor your cholesterol levels and a metabolic panel to check your liver function.  -TRICUSPID VALVE REGURGITATION: Tricuspid valve regurgitation is a condition where the valve between the right atrium and right ventricle of your heart doesn't close properly, causing blood to flow backward. Your condition is stable with no acute symptoms, so no changes are needed at this time.  -PREDIABETES: Prediabetes means your blood sugar levels are higher than normal but not high enough to be classified as diabetes. You are managing this with dietary measures. We have ordered a hemoglobin A1c test and fasting blood sugar test to monitor your condition, as well as a random insulin  level.  -PLANTAR FASCIITIS: Plantar fasciitis is inflammation of the tissue on the bottom of your foot. You are managing this with physical therapy and orthotics, and there are no acute issues at this  time.  -VITAMIN D  DEFICIENCY: Vitamin D  deficiency means you have lower than normal levels of vitamin D , which is important for bone health. You are taking 5000 IU of vitamin D  daily. We have ordered a vitamin D  level test to see if you still need to continue this supplementation.  -ORTHOSTATIC DIZZINESS: Orthostatic dizziness is dizziness that occurs when standing up, likely due to a drop in blood pressure. We recommend you stay hydrated, especially in the heat, to help manage this.  -CHERRY HEMANGIOMAS AND PAPULES: Cherry hemangiomas are small, benign red skin growths, and the papules are small, itchy bumps. Dermatology has assessed these as normal. We recommend using over-the-counter topical hydrocortisone for the itchiness.  -GENERAL HEALTH MAINTENANCE: We have ordered several routine screenings and lab tests to monitor your overall health, including a complete blood count (CBC), iron studies, thyroid  function tests, urinalysis, microalbumin/creatinine ratio, and a PSA test for prostate cancer screening.  INSTRUCTIONS: Pleas e schedule a follow-up appointment in one month to review your home blood pressure readings and lab results.  [facility] This handout provides a convenient list of our outpatient pharmacies to help you fill your prescriptions.  [facility] at [facility]  Location: [address], [place], [place], [place] [address]  Contact: [phone]  Hours: Monday - Friday, 7:30 a.m. - 6:00 p.m.  [facility] at [facility]  Location: [address], [address], [place], [place] [address]  Contact: [phone]  Hours: Monday - Friday, 8:30 a.m. - 6:00 p.m.  [facility] at [facility]  Location: [address], [address], [place], [place] [address]  Contact: [phone]  Hours: Monday - Friday, 7:30 a.m. -  6:00 p.m.  Saturday, 8:00 a.m. -  4:30 p.m.  [facility] at [facility]  Location: [address], [address] [place], [place] [address]  Contact: [phone]  Hours: Monday - Friday, 7:30 a.m. - 6:00 p.m.  [facility] at [facility]  Location: [address], [address], [place], [place] [address]  Contact: [phone]  Hours: Monday - Friday, 7:30 a.m. - 6:00 p.m.  [facility] at [facility]  Location: [address], [place], [place] [address]  Contact: [phone]  Hours: Monday - Friday, 7:30 a.m. - 6:00 p.m.  [facility] at [facility]  Location: [address] [place], [place], [place] [address]  Contact: [phone]  Hours: Monday - Friday, 7:30 a.m. - 6:00 p.m.  Saturday, 8:00 a.m. - 4:30 p.m.  [facility]  Location: [address] (inside [facility] [place], [place] [address]  Contact: [phone]  Hours: Monday - Friday, 8:00 a.m. - 5:00 p.m.

## 2024-04-21 LAB — URINALYSIS W MICROSCOPIC + REFLEX CULTURE
Bacteria, UA: NONE SEEN /HPF
Bilirubin Urine: NEGATIVE
Glucose, UA: NEGATIVE
Hgb urine dipstick: NEGATIVE
Hyaline Cast: NONE SEEN /LPF
Ketones, ur: NEGATIVE
Leukocyte Esterase: NEGATIVE
Nitrites, Initial: NEGATIVE
RBC / HPF: NONE SEEN /HPF (ref 0–2)
Specific Gravity, Urine: 1.02 (ref 1.001–1.035)
Squamous Epithelial / HPF: NONE SEEN /HPF (ref <=5)
WBC, UA: NONE SEEN /HPF (ref 0–5)
pH: 5.5 (ref 5.0–8.0)

## 2024-04-21 LAB — COMPLETE METABOLIC PANEL WITHOUT GFR
AG Ratio: 1.6 (calc) (ref 1.0–2.5)
ALT: 20 U/L (ref 9–46)
AST: 18 U/L (ref 10–35)
Albumin: 4.4 g/dL (ref 3.6–5.1)
Alkaline phosphatase (APISO): 77 U/L (ref 35–144)
BUN: 18 mg/dL (ref 7–25)
CO2: 28 mmol/L (ref 20–32)
Calcium: 9.2 mg/dL (ref 8.6–10.3)
Chloride: 104 mmol/L (ref 98–110)
Creat: 0.83 mg/dL (ref 0.70–1.28)
Globulin: 2.7 g/dL (ref 1.9–3.7)
Glucose, Bld: 105 mg/dL — ABNORMAL HIGH (ref 65–99)
Potassium: 4.3 mmol/L (ref 3.5–5.3)
Sodium: 141 mmol/L (ref 135–146)
Total Bilirubin: 0.5 mg/dL (ref 0.2–1.2)
Total Protein: 7.1 g/dL (ref 6.1–8.1)

## 2024-04-21 LAB — IRON,TIBC AND FERRITIN PANEL
%SAT: 34 % (ref 20–48)
Ferritin: 97 ng/mL (ref 24–380)
Iron: 126 ug/dL (ref 50–180)
TIBC: 370 ug/dL (ref 250–425)

## 2024-04-21 LAB — NO CULTURE INDICATED

## 2024-04-21 LAB — INSULIN, RANDOM: Insulin: 11.6 u[IU]/mL

## 2024-05-11 ENCOUNTER — Other Ambulatory Visit: Payer: Self-pay | Admitting: Nurse Practitioner

## 2024-05-21 ENCOUNTER — Ambulatory Visit: Admitting: Family Medicine

## 2024-05-25 ENCOUNTER — Ambulatory Visit (INDEPENDENT_AMBULATORY_CARE_PROVIDER_SITE_OTHER): Admitting: Family Medicine

## 2024-05-25 ENCOUNTER — Encounter: Payer: Self-pay | Admitting: Family Medicine

## 2024-05-25 VITALS — BP 148/70 | HR 87 | Temp 97.3°F | Resp 18 | Wt 179.8 lb

## 2024-05-25 DIAGNOSIS — Z79899 Other long term (current) drug therapy: Secondary | ICD-10-CM

## 2024-05-25 DIAGNOSIS — I10 Essential (primary) hypertension: Secondary | ICD-10-CM | POA: Diagnosis not present

## 2024-05-25 LAB — MICROALBUMIN / CREATININE URINE RATIO
Creatinine,U: 93 mg/dL
Microalb Creat Ratio: 49.3 mg/g — ABNORMAL HIGH (ref 0.0–30.0)
Microalb, Ur: 4.6 mg/dL — ABNORMAL HIGH (ref 0.0–1.9)

## 2024-05-25 MED ORDER — VALSARTAN-HYDROCHLOROTHIAZIDE 80-12.5 MG PO TABS: 1.0000 | ORAL_TABLET | Freq: Every day | ORAL | 3 refills | Status: AC

## 2024-05-25 NOTE — Progress Notes (Signed)
 Assessment & Plan   Assessment/Plan:    Assessment & Plan Hypertension with proteinuria Hypertension remains elevated with recent clinic blood pressure of 148/70 mmHg and home readings ranging from 155-160/94-100 mmHg. Proteinuria is mild. Current treatment includes valsartan  80 mg. Renal function is well-managed with normal GFR. The goal is to achieve blood pressure under 130/80 mmHg due to aortic atherosclerosis, steroid-induced diabetes, and hyperlipidemia. Discussed benefits of adding a diuretic to valsartan  to improve blood pressure control and reduce mortality risk. Explained diminishing returns with increasing valsartan  doses and benefits of combination therapy. Discussed potential side effects of valsartan , including changes in renal function and electrolytes, and the need for monitoring. He prefers combination pill for convenience. - Discontinue current valsartan  80 mg once new prescription is filled. - Prescribe valsartan /hydrochlorothiazide  80/12.5 mg combination pill. - Order point-of-care microalbumin test today to recheck proteinuria. - Order repeat BMP in two weeks to monitor renal function and electrolytes. - Schedule follow-up appointment in one month for blood pressure check. - Instruct to continue daily home blood pressure monitoring.      Medications Discontinued During This Encounter  Medication Reason   Cholecalciferol  5000 units capsule    valsartan  (DIOVAN ) 80 MG tablet     Return in about 1 month (around 06/25/2024) for BP.        Subjective:   Encounter date: 05/25/2024  [name] [name] is a 68 y.o. 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   He  has a past medical history of Arthritis, Heart murmur, Hyperlipidemia, Hypertension, Shingles, and Vitamin D  deficiency.SABRA   He presents with chief complaint of Hypertension (1 month follow up. Pt BP reading at home ranges from 155/94-166/100. Pt have BP machine in clinic today. ) .   Discussed the use of AI scribe software for clinical note transcription with the patient, who gave verbal consent to proceed.  History of Present Illness [name] [name] is a 68 year old male with hypertension who presents for a follow-up visit.  His recent blood pressure readings at the clinic were 148/70 mmHg, while his home readings have ranged from 155-160 mmHg systolic over 94-100 mmHg diastolic. These readings are consistent with previous measurements. He is currently taking valsartan  80 mg daily for blood pressure management and is almost out of his current supply.  He has a history of aortic atherosclerosis, steroid-induced diabetes, and hyperlipidemia. His blood pressure goal is under 130/80 mmHg due to these conditions. He recently had lab work done which showed stable renal function with a GFR within normal limits.  No chest pain, abdominal pain, or leg swelling.     ROS  Past Surgical History:  Procedure Laterality Date   ARTHROSCOPY WITH ANTERIOR CRUCIATE LIGAMENT (ACL) REPAIR WITH ANTERIOR TIBILIAS GRAFT Right    HERNIA REPAIR  2018   Double hernia repair   INGUINAL HERNIA REPAIR Bilateral 09/28/2015   Procedure: LAPAROSCOPIC BILATERAL INGUINAL HERNIA REPAIR;  Surgeon: Camellia Blush, MD;  Location: [facility];  Service: General;  Laterality: Bilateral;   INSERTION OF  MESH Bilateral  09/28/2015   Procedure: INSERTION OF MESH;  Surgeon: Camellia Blush, MD;  Location: [facility];  Service: General;  Laterality: Bilateral;   JOINT REPLACEMENT  2019 & 2024   Both knee replacements   SHOULDER ARTHROSCOPY     TOTAL KNEE ARTHROPLASTY Right 01/11/2020   Procedure: TOTAL KNEE ARTHROPLASTY;  Surgeon: Beverley Evalene BIRCH, MD;  Location: [facility];  Service: Orthopedics;  Laterality: Right;    Outpatient Medications Prior to Visit  Medication Sig Dispense Refill   rosuvastatin  (CRESTOR ) 20 MG tablet TAKE 1 TABLET BY MOUTH EVERY DAY FOR CHOLESTEROL 90 tablet 3   Cholecalciferol  5000 units capsule Take 1 capsule (5,000 Units total) by mouth daily. 1000 capsule 99   valsartan  (DIOVAN ) 80 MG tablet TAKE 1 TABLET BY MOUTH EVERY DAY FOR BLOOD PRESSURE 90 tablet 3   No facility-administered medications prior to visit.    Family History  Problem Relation Age of Onset   Cancer Mother        colon, lung   Heart disease Father    Hyperlipidemia Father    Heart attack Father     Social History   Socioeconomic History   Marital status: Divorced    Spouse name: Not on file   Number of children: Not on file   Years of education: Not on file   Highest education level: 12th grade  Occupational History   Not on file  Tobacco Use   Smoking status: Never   Smokeless tobacco: Never   Tobacco comments:    Never smoked  Vaping Use   Vaping status: Never Used  Substance and Sexual Activity   Alcohol use: Yes    Comment: OCCASIONAL BEER    Drug use: No   Sexual activity: Not on file  Other Topics Concern   Not on file  Social History Narrative   Not on file   Social Drivers of Health   Financial Resource Strain: Low Risk  (04/16/2024)   Overall Financial Resource Strain (CARDIA)    Difficulty of Paying Living Expenses: Not hard at all  Food Insecurity: No Food Insecurity (04/16/2024)   Hunger Vital Sign    Worried About Running Out of Food in the Last Year: Never true     Ran Out of Food in the Last Year: Never true  Transportation Needs: No Transportation Needs (04/16/2024)   PRAPARE - Administrator, Civil Service (Medical): No    Lack of Transportation (Non-Medical): No  Physical Activity: Insufficiently Active (04/16/2024)   Exercise Vital Sign    Days of Exercise per Week: 3 days    Minutes of Exercise per Session: 40 min  Stress: No Stress Concern Present (04/16/2024)   [employer] of Occupational Health - Occupational Stress Questionnaire    Feeling of Stress: Not at all  Social Connections: Socially Isolated (04/16/2024)   Social Connection and Isolation Panel    Frequency of Communication with Friends and Family: More than three times a week    Frequency of Social Gatherings with Friends and Family: Twice a week    Attends Religious Services: Patient declined    Database administrator or Organizations: No    Attends Engineer, structural: Not on file    Marital Status: Divorced  Intimate Partner Violence: Not on file  Objective:  Physical Exam: BP (!) 148/70 (BP Location: Left Arm, Patient Position: Sitting, Cuff Size: Normal) Comment: recheck done manual  Pulse 87   Temp (!) 97.3 F (36.3 C) (Temporal)   Resp 18   Wt 179 lb 12.8 oz (81.6 kg)   SpO2 96%   BMI 25.80 kg/m    Physical Exam GENERAL: Alert, cooperative, well developed, no acute distress HEENT: Normocephalic, normal oropharynx, moist mucous membranes CHEST: Clear to auscultation bilaterally, no wheezes, rhonchi, or crackles CARDIOVASCULAR: Normal heart rate and rhythm, S1 and S2 normal without murmurs ABDOMEN: Soft, non-tender, non-distended, without organomegaly, normal bowel sounds EXTREMITIES: No cyanosis, edema, or leg swelling NEUROLOGICAL: Cranial nerves grossly intact, moves all extremities without gross motor or sensory deficit   Physical  Exam  No results found.  Recent Results (from the past 2160 hours)  CBC with Differential/Platelet     Status: None   Collection Time: 04/20/24  9:15 AM  Result Value Ref Range   WBC 6.5 4.0 - 10.5 K/uL   RBC 4.90 4.22 - 5.81 Mil/uL   Hemoglobin 14.3 13.0 - 17.0 g/dL   HCT 57.3 60.9 - 47.9 %   MCV 86.8 78.0 - 100.0 fl   MCHC 33.5 30.0 - 36.0 g/dL   RDW 85.9 88.4 - 84.4 %   Platelets 218.0 150.0 - 400.0 K/uL   Neutrophils Relative % 61.9 43.0 - 77.0 %   Lymphocytes Relative 25.6 12.0 - 46.0 %   Monocytes Relative 6.9 3.0 - 12.0 %   Eosinophils Relative 4.7 0.0 - 5.0 %   Basophils Relative 0.9 0.0 - 3.0 %   Neutro Abs 4.0 1.4 - 7.7 K/uL   Lymphs Abs 1.7 0.7 - 4.0 K/uL   Monocytes Absolute 0.4 0.1 - 1.0 K/uL   Eosinophils Absolute 0.3 0.0 - 0.7 K/uL   Basophils Absolute 0.1 0.0 - 0.1 K/uL  COMPLETE METABOLIC PANEL WITHOUT GFR     Status: Abnormal   Collection Time: 04/20/24  9:15 AM  Result Value Ref Range   Glucose, Bld 105 (H) 65 - 99 mg/dL    Comment: .            Fasting reference interval . For someone without known diabetes, a glucose value between 100 and 125 mg/dL is consistent with prediabetes and should be confirmed with a follow-up test. .    BUN 18 7 - 25 mg/dL   Creat 9.16 9.29 - 8.71 mg/dL   BUN/Creatinine Ratio SEE NOTE: 6 - 22 (calc)    Comment:    Not Reported: BUN and Creatinine are within    reference range. .    Sodium 141 135 - 146 mmol/L   Potassium 4.3 3.5 - 5.3 mmol/L   Chloride 104 98 - 110 mmol/L   CO2 28 20 - 32 mmol/L   Calcium  9.2 8.6 - 10.3 mg/dL   Total Protein 7.1 6.1 - 8.1 g/dL   Albumin 4.4 3.6 - 5.1 g/dL   Globulin 2.7 1.9 - 3.7 g/dL (calc)   AG Ratio 1.6 1.0 - 2.5 (calc)   Total Bilirubin 0.5 0.2 - 1.2 mg/dL   Alkaline phosphatase (APISO) 77 35 - 144 U/L   AST 18 10 - 35 U/L   ALT 20 9 - 46 U/L  Hemoglobin A1c     Status: None   Collection Time: 04/20/24  9:15 AM  Result Value Ref Range   Hgb A1c MFr Bld 6.5 4.6 - 6.5 %     Comment: Glycemic  Control Guidelines for People with Diabetes:Non Diabetic:  <6%Goal of Therapy: <7%Additional Action Suggested:  >8%   Insulin , random     Status: None   Collection Time: 04/20/24  9:15 AM  Result Value Ref Range   Insulin  11.6 uIU/mL    Comment:       Reference Range  < or = 18.4 .       Risk:       Optimal          < or = 18.4       Moderate         NA       High             >18.4 .       Adult cardiovascular event risk category       cut points (optimal, moderate, high)       are based on Insulin  Reference Interval       studies performed at [facility]       in 2022. .   Iron, TIBC and Ferritin Panel     Status: None   Collection Time: 04/20/24  9:15 AM  Result Value Ref Range   Iron 126 50 - 180 mcg/dL   TIBC 629 749 - 574 mcg/dL (calc)   %SAT 34 20 - 48 % (calc)   Ferritin 97 24 - 380 ng/mL  Lipid panel     Status: None   Collection Time: 04/20/24  9:15 AM  Result Value Ref Range   Cholesterol 170 0 - 200 mg/dL    Comment: ATP III Classification       Desirable:  < 200 mg/dL               Borderline High:  200 - 239 mg/dL          High:  > = 759 mg/dL   Triglycerides 893.9 0.0 - 149.0 mg/dL    Comment: Normal:  <849 mg/dLBorderline High:  150 - 199 mg/dL   HDL 47.69 >60.99 mg/dL   VLDL 78.7 0.0 - 59.9 mg/dL   LDL Cholesterol 97 0 - 99 mg/dL   Total CHOL/HDL Ratio 3     Comment:                Men          Women1/2 Average Risk     3.4          3.3Average Risk          5.0          4.42X Average Risk          9.6          7.13X Average Risk          15.0          11.0                       NonHDL 117.82     Comment: NOTE:  Non-HDL goal should be 30 mg/dL higher than patient's LDL goal (i.e. LDL goal of < 70 mg/dL, would have non-HDL goal of < 100 mg/dL)  PSA     Status: None   Collection Time: 04/20/24  9:15 AM  Result Value Ref Range   PSA 3.97 0.10 - 4.00 ng/mL    Comment: Test performed using Access [employer]britech PSA Assay, a parmagnetic  partical, chemiluminecent immunoassay.  TSH     Status: None  Collection Time: 04/20/24  9:15 AM  Result Value Ref Range   TSH 2.37 0.35 - 5.50 uIU/mL  VITAMIN D  25 Hydroxy (Vit-D Deficiency, Fractures)     Status: None   Collection Time: 04/20/24  9:15 AM  Result Value Ref Range   VITD 54.71 30.00 - 100.00 ng/mL  Urinalysis w microscopic + reflex cultur     Status: Abnormal   Collection Time: 04/20/24  9:16 AM   Specimen: Urine  Result Value Ref Range   Color, Urine YELLOW YELLOW   APPearance CLEAR CLEAR   Specific Gravity, Urine 1.020 1.001 - 1.035   pH 5.5 5.0 - 8.0   Glucose, UA NEGATIVE NEGATIVE   Bilirubin Urine NEGATIVE NEGATIVE   Ketones, ur NEGATIVE NEGATIVE   Hgb urine dipstick NEGATIVE NEGATIVE   Protein, ur TRACE (A) NEGATIVE   Nitrites, Initial NEGATIVE NEGATIVE   Leukocyte Esterase NEGATIVE NEGATIVE   WBC, UA NONE SEEN 0 - 5 /HPF   RBC / HPF NONE SEEN 0 - 2 /HPF   Squamous Epithelial / HPF NONE SEEN < OR = 5 /HPF   Bacteria, UA NONE SEEN NONE SEEN /HPF   Hyaline Cast NONE SEEN NONE SEEN /LPF   Note      Comment: This urine was analyzed for the presence of WBC,  RBC, bacteria, casts, and other formed elements.  Only those elements seen were reported. . .   REFLEXIVE URINE CULTURE     Status: None   Collection Time: 04/20/24  9:16 AM  Result Value Ref Range   Reflexve Urine Culture      Comment: NO CULTURE INDICATED        Beverley Adine Hummer, MD, MS

## 2024-05-25 NOTE — Patient Instructions (Signed)
  VISIT SUMMARY: You came in today for a follow-up visit to check on your blood pressure and overall health. Your recent blood pressure readings have been higher than the target goal, and we discussed adjusting your medication to better manage it.  YOUR PLAN: -HYPERTENSION WITH PROTEINURIA: Hypertension means high blood pressure, and proteinuria means there is a small amount of protein in your urine, which can be a sign of kidney issues. Your blood pressure is still higher than we want it to be, so we are changing your medication. You will stop taking your current valsartan  80 mg once you get the new prescription. The new medication will be a combination pill of valsartan  80 mg and hydrochlorothiazide  12.5 mg, which should help lower your blood pressure more effectively. We will also do a microalbumin test today to check the protein levels in your urine and repeat a basic metabolic panel (BMP) in two weeks to monitor your kidney function and electrolytes. Please continue to monitor your blood pressure at home every day.  INSTRUCTIONS: Please discontinue your current valsartan  80 mg once you receive the new prescription for valsartan /hydrochlorothiazide  80/12.5 mg. We will do a microalbumin test today and repeat a BMP in two weeks. Schedule a follow-up appointment in one month for a blood pressure check. Continue daily home blood pressure monitoring.

## 2024-06-08 ENCOUNTER — Other Ambulatory Visit (INDEPENDENT_AMBULATORY_CARE_PROVIDER_SITE_OTHER)

## 2024-06-08 ENCOUNTER — Ambulatory Visit: Payer: Self-pay | Admitting: Family Medicine

## 2024-06-08 DIAGNOSIS — I10 Essential (primary) hypertension: Secondary | ICD-10-CM | POA: Diagnosis not present

## 2024-06-08 LAB — BASIC METABOLIC PANEL WITH GFR
BUN: 20 mg/dL (ref 6–23)
CO2: 30 meq/L (ref 19–32)
Calcium: 9.9 mg/dL (ref 8.4–10.5)
Chloride: 100 meq/L (ref 96–112)
Creatinine, Ser: 0.9 mg/dL (ref 0.40–1.50)
GFR: 86.51 mL/min (ref >60.00)
Glucose, Bld: 136 mg/dL — ABNORMAL HIGH (ref 70–99)
Potassium: 4.1 meq/L (ref 3.5–5.1)
Sodium: 139 meq/L (ref 135–145)

## 2024-06-25 ENCOUNTER — Ambulatory Visit (INDEPENDENT_AMBULATORY_CARE_PROVIDER_SITE_OTHER): Admitting: Family Medicine

## 2024-06-25 ENCOUNTER — Encounter: Payer: Self-pay | Admitting: Family Medicine

## 2024-06-25 VITALS — BP 135/87 | HR 77 | Temp 97.0°F | Resp 18 | Wt 177.8 lb

## 2024-06-25 DIAGNOSIS — N182 Chronic kidney disease, stage 2 (mild): Secondary | ICD-10-CM | POA: Insufficient documentation

## 2024-06-25 DIAGNOSIS — I10 Essential (primary) hypertension: Secondary | ICD-10-CM

## 2024-06-25 DIAGNOSIS — H60332 Swimmer's ear, left ear: Secondary | ICD-10-CM | POA: Diagnosis not present

## 2024-06-25 MED ORDER — OFLOXACIN 0.3 % OT SOLN: 10.0000 [drp] | Freq: Every day | OTIC | 0 refills | Status: DC

## 2024-06-25 MED ORDER — OFLOXACIN 0.3 % OT SOLN: 10.0000 [drp] | Freq: Every day | OTIC | 0 refills | Status: AC

## 2024-06-25 NOTE — Patient Instructions (Addendum)
  VISIT SUMMARY: You came in today for a follow-up on your blood pressure and to address discomfort in your left ear and head. Your blood pressure is well-controlled, and we discussed your current medication regimen. We also identified and treated a mild ear infection.  YOUR PLAN: LEFT OTITIS EXTERNA: You have a mild ear infection causing discomfort and tenderness in your left ear. -Use ofloxacin  ear drops, 10 drops once daily for 7 days. -Take acetaminophen  for pain if needed. -Contact us  if there is no improvement.  HYPERTENSION: Your blood pressure is well-controlled with your current medication. -Continue taking valsartan  80 mg with hydrochlorothiazide  12.5 mg daily. -Recheck your blood pressure in three months.  PROTEINURIA: You have mild protein in your urine, but your kidney function is stable. -We will continue to monitor your kidney function.

## 2024-06-25 NOTE — Progress Notes (Signed)
 Assessment & Plan   Assessment/Plan:     Assessment & Plan Left otitis externa Left ear discomfort and tenderness for one week, with no drainage, fever, or chills. Ear canal is red and inflamed, suggestive of mild otitis externa. No wax buildup observed, and hearing is not significantly affected. - Prescribe ofloxacin  ear drops, 10 drops once daily for 7 days. - Use acetaminophen  for pain management if needed. - Report if no improvement.  Hypertension Hypertension is well-controlled with valsartan  80 mg and hydrochlorothiazide  12.5 mg daily. Home blood pressure readings are consistently around 115/65 mmHg. No side effects reported. Kidney function is stable with GFR at 87 and mild proteinuria noted at 49. - Continue valsartan  80 mg with hydrochlorothiazide  12.5 mg daily. - Recheck blood pressure in three months.  Proteinuria Mild proteinuria noted at 49. Kidney function is stable with GFR at 87. Current blood pressure regimen is not adversely affecting kidney function. - Continue monitoring kidney function.      Medications Discontinued During This Encounter  Medication Reason   ofloxacin  (FLOXIN ) 0.3 % OTIC solution     Return in about 3 months (around 09/24/2024) for BP.        Subjective:   Encounter date: 06/25/2024  [name] [name] is a 68 y.o. 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   He  has a past medical history of Arthritis, Heart murmur, Hyperlipidemia, Hypertension, Shingles, and Vitamin D  deficiency.SABRA   He presents with chief complaint of Hypertension (1 month follow up. Pt stated reading at home range from 135/87//HM due-  vaccinations ( patient declined) ) and Ear Pain (Pt c/o of left ear; back of head and facial discomfort for 1 week.) .   Discussed the use of AI scribe software for clinical note transcription with the patient, who gave verbal consent to proceed.  History of Present Illness [name] [name] is a 68 year old male with hypertension who presents for blood pressure follow-up and complaints of left ear and head discomfort.  Hypertension - Blood pressure readings at home average 135/87 mmHg, within target range - Current antihypertensive regimen: valsartan  80 mg with hydrochlorothiazide  12.5 mg daily - Blood pressure has decreased to as low as 115/65 mmHg in the afternoons on current regimen - No side effects from medication, including no frequent nighttime urination  Left ear and head discomfort - Soreness in left ear and head for approximately one week - Sensation described as soreness, not a headache - Canal feels swollen, making Q-tip insertion difficult - No ear drainage, fever, or chills - Tenderness in left ear and head area - Discomfort has been improving over the past week  Hearing loss - Gradual worsening of hearing over the years - No acute change in hearing associated with current episode     ROS  Past Surgical History:  Procedure Laterality Date   ARTHROSCOPY WITH ANTERIOR CRUCIATE LIGAMENT (ACL) REPAIR WITH ANTERIOR TIBILIAS GRAFT Right  HERNIA REPAIR  2018   Double hernia repair   INGUINAL HERNIA REPAIR Bilateral 09/28/2015   Procedure: LAPAROSCOPIC  BILATERAL INGUINAL HERNIA REPAIR;  Surgeon: Camellia Blush, MD;  Location: [facility];  Service: General;  Laterality: Bilateral;   INSERTION OF MESH Bilateral 09/28/2015   Procedure: INSERTION OF MESH;  Surgeon: Camellia Blush, MD;  Location: [facility];  Service: General;  Laterality: Bilateral;   JOINT REPLACEMENT  2019 & 2024   Both knee replacements   SHOULDER ARTHROSCOPY     TOTAL KNEE ARTHROPLASTY Right 01/11/2020   Procedure: TOTAL KNEE ARTHROPLASTY;  Surgeon: Beverley Evalene BIRCH, MD;  Location: [facility];  Service: Orthopedics;  Laterality: Right;    Outpatient Medications Prior to Visit  Medication Sig Dispense Refill   rosuvastatin  (CRESTOR ) 20 MG tablet TAKE 1 TABLET BY MOUTH EVERY DAY FOR CHOLESTEROL 90 tablet 3   valsartan -hydrochlorothiazide  (DIOVAN -HCT) 80-12.5 MG tablet Take 1 tablet by mouth daily. 90 tablet 3   No facility-administered medications prior to visit.    Family History  Problem Relation Age of Onset   Cancer Mother        colon, lung   Heart disease Father    Hyperlipidemia Father    Heart attack Father     Social History   Socioeconomic History   Marital status: Divorced    Spouse name: Not on file   Number of children: Not on file   Years of education: Not on file   Highest education level: 12th grade  Occupational History   Not on file  Tobacco Use   Smoking status: Never   Smokeless tobacco: Never   Tobacco comments:    Never smoked  Vaping Use   Vaping status: Never Used  Substance and Sexual Activity   Alcohol use: Yes    Comment: OCCASIONAL BEER    Drug use: No   Sexual activity: Not Currently  Other Topics Concern   Not on file  Social History Narrative   Not on file   Social Drivers of Health   Financial Resource Strain: Low Risk  (04/16/2024)   Overall Financial Resource Strain (CARDIA)    Difficulty of Paying Living Expenses: Not hard at all  Food Insecurity: No Food Insecurity (04/16/2024)   Hunger Vital Sign    Worried About Running  Out of Food in the Last Year: Never true    Ran Out of Food in the Last Year: Never true  Transportation Needs: No Transportation Needs (04/16/2024)   PRAPARE - Administrator, Civil Service (Medical): No    Lack of Transportation (Non-Medical): No  Physical Activity: Insufficiently Active (04/16/2024)   Exercise Vital Sign    Days of Exercise per Week: 3 days    Minutes of Exercise per Session: 40 min  Stress: No Stress Concern Present (04/16/2024)   [employer] of Occupational Health - Occupational Stress Questionnaire    Feeling of Stress: Not at all  Social Connections: Socially Isolated (04/16/2024)   Social Connection and Isolation Panel    Frequency of Communication with Friends and Family: More than three times a week    Frequency of Social Gatherings with Friends and Family: Twice a week    Attends Religious Services: Patient declined    Database administrator or Organizations: No    Attends Engineer, structural: Not on file    Marital Status: Divorced  Intimate Partner Violence: Not on file  Objective:  Physical Exam: BP 135/87 Comment: @home  reading  Pulse 77   Temp (!) 97 F (36.1 C) (Temporal)   Resp 18   Wt 177 lb 12.8 oz (80.6 kg)   SpO2 98%   BMI 25.51 kg/m    Physical Exam GENERAL: Alert, cooperative, well developed, no acute distress. HEENT: Normocephalic, normal oropharynx, moist mucous membranes. Left ear canal red and inflamed, no cerumen impaction, tympanic membrane visible. CHEST: Clear to auscultation bilaterally, no wheezes, rhonchi, or crackles. CARDIOVASCULAR: Normal heart rate and rhythm, S1 and S2 normal without murmurs. ABDOMEN: Soft, non-tender, non-distended, without organomegaly, normal bowel sounds. EXTREMITIES: No cyanosis or edema. NEUROLOGICAL: Cranial nerves grossly intact, moves all extremities without gross motor or  sensory deficit.   Physical Exam  No results found.  Recent Results (from the past 2160 hours)  CBC with Differential/Platelet     Status: None   Collection Time: 04/20/24  9:15 AM  Result Value Ref Range   WBC 6.5 4.0 - 10.5 K/uL   RBC 4.90 4.22 - 5.81 Mil/uL   Hemoglobin 14.3 13.0 - 17.0 g/dL   HCT 57.3 60.9 - 47.9 %   MCV 86.8 78.0 - 100.0 fl   MCHC 33.5 30.0 - 36.0 g/dL   RDW 85.9 88.4 - 84.4 %   Platelets 218.0 150.0 - 400.0 K/uL   Neutrophils Relative % 61.9 43.0 - 77.0 %   Lymphocytes Relative 25.6 12.0 - 46.0 %   Monocytes Relative 6.9 3.0 - 12.0 %   Eosinophils Relative 4.7 0.0 - 5.0 %   Basophils Relative 0.9 0.0 - 3.0 %   Neutro Abs 4.0 1.4 - 7.7 K/uL   Lymphs Abs 1.7 0.7 - 4.0 K/uL   Monocytes Absolute 0.4 0.1 - 1.0 K/uL   Eosinophils Absolute 0.3 0.0 - 0.7 K/uL   Basophils Absolute 0.1 0.0 - 0.1 K/uL  COMPLETE METABOLIC PANEL WITHOUT GFR     Status: Abnormal   Collection Time: 04/20/24  9:15 AM  Result Value Ref Range   Glucose, Bld 105 (H) 65 - 99 mg/dL    Comment: .            Fasting reference interval . For someone without known diabetes, a glucose value between 100 and 125 mg/dL is consistent with prediabetes and should be confirmed with a follow-up test. .    BUN 18 7 - 25 mg/dL   Creat 9.16 9.29 - 8.71 mg/dL   BUN/Creatinine Ratio SEE NOTE: 6 - 22 (calc)    Comment:    Not Reported: BUN and Creatinine are within    reference range. .    Sodium 141 135 - 146 mmol/L   Potassium 4.3 3.5 - 5.3 mmol/L   Chloride 104 98 - 110 mmol/L   CO2 28 20 - 32 mmol/L   Calcium  9.2 8.6 - 10.3 mg/dL   Total Protein 7.1 6.1 - 8.1 g/dL   Albumin 4.4 3.6 - 5.1 g/dL   Globulin 2.7 1.9 - 3.7 g/dL (calc)   AG Ratio 1.6 1.0 - 2.5 (calc)   Total Bilirubin 0.5 0.2 - 1.2 mg/dL   Alkaline phosphatase (APISO) 77 35 - 144 U/L   AST 18 10 - 35 U/L   ALT 20 9 - 46 U/L  Hemoglobin A1c     Status: None   Collection Time: 04/20/24  9:15 AM  Result Value Ref Range   Hgb  A1c MFr Bld 6.5 4.6 - 6.5 %    Comment: Glycemic Control Guidelines  for People with Diabetes:Non Diabetic:  <6%Goal of Therapy: <7%Additional Action Suggested:  >8%   Insulin , random     Status: None   Collection Time: 04/20/24  9:15 AM  Result Value Ref Range   Insulin  11.6 uIU/mL    Comment:       Reference Range  < or = 18.4 .       Risk:       Optimal          < or = 18.4       Moderate         NA       High             >18.4 .       Adult cardiovascular event risk category       cut points (optimal, moderate, high)       are based on Insulin  Reference Interval       studies performed at [facility]       in 2022. .   Iron, TIBC and Ferritin Panel     Status: None   Collection Time: 04/20/24  9:15 AM  Result Value Ref Range   Iron 126 50 - 180 mcg/dL   TIBC 629 749 - 574 mcg/dL (calc)   %SAT 34 20 - 48 % (calc)   Ferritin 97 24 - 380 ng/mL  Lipid panel     Status: None   Collection Time: 04/20/24  9:15 AM  Result Value Ref Range   Cholesterol 170 0 - 200 mg/dL    Comment: ATP III Classification       Desirable:  < 200 mg/dL               Borderline High:  200 - 239 mg/dL          High:  > = 759 mg/dL   Triglycerides 893.9 0.0 - 149.0 mg/dL    Comment: Normal:  <849 mg/dLBorderline High:  150 - 199 mg/dL   HDL 47.69 >60.99 mg/dL   VLDL 78.7 0.0 - 59.9 mg/dL   LDL Cholesterol 97 0 - 99 mg/dL   Total CHOL/HDL Ratio 3     Comment:                Men          Women1/2 Average Risk     3.4          3.3Average Risk          5.0          4.42X Average Risk          9.6          7.13X Average Risk          15.0          11.0                       NonHDL 117.82     Comment: NOTE:  Non-HDL goal should be 30 mg/dL higher than patient's LDL goal (i.e. LDL goal of < 70 mg/dL, would have non-HDL goal of < 100 mg/dL)  PSA     Status: None   Collection Time: 04/20/24  9:15 AM  Result Value Ref Range   PSA 3.97 0.10 - 4.00 ng/mL    Comment: Test performed using Access [employer]  PSA Assay, a parmagnetic partical, chemiluminecent immunoassay.  TSH     Status: None   Collection  Time: 04/20/24  9:15 AM  Result Value Ref Range   TSH 2.37 0.35 - 5.50 uIU/mL  VITAMIN D  25 Hydroxy (Vit-D Deficiency, Fractures)     Status: None   Collection Time: 04/20/24  9:15 AM  Result Value Ref Range   VITD 54.71 30.00 - 100.00 ng/mL  Urinalysis w microscopic + reflex cultur     Status: Abnormal   Collection Time: 04/20/24  9:16 AM   Specimen: Urine  Result Value Ref Range   Color, Urine YELLOW YELLOW   APPearance CLEAR CLEAR   Specific Gravity, Urine 1.020 1.001 - 1.035   pH 5.5 5.0 - 8.0   Glucose, UA NEGATIVE NEGATIVE   Bilirubin Urine NEGATIVE NEGATIVE   Ketones, ur NEGATIVE NEGATIVE   Hgb urine dipstick NEGATIVE NEGATIVE   Protein, ur TRACE (A) NEGATIVE   Nitrites, Initial NEGATIVE NEGATIVE   Leukocyte Esterase NEGATIVE NEGATIVE   WBC, UA NONE SEEN 0 - 5 /HPF   RBC / HPF NONE SEEN 0 - 2 /HPF   Squamous Epithelial / HPF NONE SEEN < OR = 5 /HPF   Bacteria, UA NONE SEEN NONE SEEN /HPF   Hyaline Cast NONE SEEN NONE SEEN /LPF   Note      Comment: This urine was analyzed for the presence of WBC,  RBC, bacteria, casts, and other formed elements.  Only those elements seen were reported. . .   REFLEXIVE URINE CULTURE     Status: None   Collection Time: 04/20/24  9:16 AM  Result Value Ref Range   Reflexve Urine Culture      Comment: NO CULTURE INDICATED  Microalbumin / creatinine urine ratio     Status: Abnormal   Collection Time: 05/25/24 10:10 AM  Result Value Ref Range   Microalb, Ur 4.6 (H) 0.0 - 1.9 mg/dL   Creatinine,U 06.9 mg/dL   Microalb Creat Ratio 49.3 (H) 0.0 - 30.0 mg/g  Basic Metabolic Panel (BMET)     Status: Abnormal   Collection Time: 06/08/24  8:05 AM  Result Value Ref Range   Sodium 139 135 - 145 mEq/L   Potassium 4.1 3.5 - 5.1 mEq/L   Chloride 100 96 - 112 mEq/L   CO2 30 19 - 32 mEq/L   Glucose, Bld 136 (H) 70 - 99 mg/dL   BUN 20 6 - 23 mg/dL    Creatinine, Ser 9.09 0.40 - 1.50 mg/dL   GFR 13.48 >39.99 mL/min    Comment: Calculated using the CKD-EPI Creatinine Equation (2021)   Calcium  9.9 8.4 - 10.5 mg/dL        Beverley Adine Hummer, MD, MS

## 2024-09-24 ENCOUNTER — Ambulatory Visit: Admitting: Family Medicine

## 2024-09-24 ENCOUNTER — Encounter: Payer: Self-pay | Admitting: Family Medicine

## 2024-09-24 VITALS — BP 125/80 | HR 78 | Temp 98.1°F | Ht 70.0 in | Wt 176.0 lb

## 2024-09-24 DIAGNOSIS — I700 Atherosclerosis of aorta: Secondary | ICD-10-CM | POA: Diagnosis not present

## 2024-09-24 DIAGNOSIS — N182 Chronic kidney disease, stage 2 (mild): Secondary | ICD-10-CM

## 2024-09-24 DIAGNOSIS — I10 Essential (primary) hypertension: Secondary | ICD-10-CM

## 2024-09-24 DIAGNOSIS — E099 Drug or chemical induced diabetes mellitus without complications: Secondary | ICD-10-CM

## 2024-09-24 DIAGNOSIS — Z79899 Other long term (current) drug therapy: Secondary | ICD-10-CM

## 2024-09-24 DIAGNOSIS — N138 Other obstructive and reflux uropathy: Secondary | ICD-10-CM

## 2024-09-24 DIAGNOSIS — E782 Mixed hyperlipidemia: Secondary | ICD-10-CM

## 2024-09-24 DIAGNOSIS — R7303 Prediabetes: Secondary | ICD-10-CM

## 2024-09-24 DIAGNOSIS — Z0000 Encounter for general adult medical examination without abnormal findings: Secondary | ICD-10-CM

## 2024-09-24 NOTE — Progress Notes (Signed)
 Assessment & Plan   Assessment/Plan:  Assessment and Plan Assessment & Plan Essential hypertension Blood pressure is well controlled at 125/80 mmHg with current medication regimen. - Continue valsartan -hydralazine-hydrochlorothiazide  80-12.5 mg daily.  Steroid-induced diabetes mellitus A1c is 6.5%, indicating diabetic range. Previous A1c was 6.4%. Discussed potential benefits of GLP-1 receptor agonists and SGLT-2 inhibitors for cardiovascular protection and weight management. Emphasized the importance of reducing cardiovascular risk rather than focusing solely on A1c levels. Discussed the potential benefits of medications like Ozempic and Farxiga, which can reduce the risk of heart attack and heart failure, and improve overall health outcomes. - Will consider GLP-1 receptor agonists or SGLT-2 inhibitors for cardiovascular protection and weight management. - Provided information on Ozempic and Mounjaro for review.  Chronic kidney disease stage 2 with proteinuria CKD stage 2 with proteinuria. Currently managed with an ARB. - Continue current ARB therapy.  Mixed hyperlipidemia Managed with rosuvastatin  20 mg daily. - Continue rosuvastatin  20 mg daily.          There are no discontinued medications.  Follow up in 3 months        Subjective:   Encounter date: 09/24/2024  [name] [name] is a 68 y.o. 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   He  has a past medical history of Abnormal glucose  (04/16/2021), Acute swimmer's ear of left side (06/25/2024), AK (actinic keratosis) (01/20/2024), Arthritis, Cherry hemangioma (04/20/2024), Fatigue (10/12/2018), Heart murmur, Hyperlipidemia, Hypertension, Low hemoglobin (04/20/2024), Orthostatic dizziness (04/20/2024), Papule of skin (04/20/2024), Plantar fascial fibromatosis of both feet (01/20/2024), Shingles, and Vitamin D  deficiency.SABRA   He presents with chief complaint of Medical Management of Chronic Issues (3 month follow up for BP, Pt states his BP has been running well at home 130 or less over 90. Pt forgot BP Log, Pt fasting this morning. ) .   Discussed the use of AI scribe software for clinical note transcription with the patient, who gave verbal consent to proceed.  History of Present Illness [name] [name] is a 68 year old male with hypertension and hyperlipidemia who presents for follow-up on blood pressure management.  Hypertension - Blood pressure well controlled at 125/80 mmHg - Currently taking valsartan , hydralazine, and hydrochlorothiazide  80/12.5 mg daily  Hyperlipidemia - Managed with rosuvastatin  20 mg daily  Glycemic control - History of steroid-induced diabetes - Recent HbA1c 6.5% (first time in diabetic range) - HbA1c has been around 6.4% for a long time  Chronic kidney disease - Stage 2 chronic kidney disease with proteinuria - On angiotensin receptor blocker (ARB)  Lower urinary tract symptoms - Mild benign prostatic hyperplasia (BPH) - Monitoring urinary symptoms  Sleep apnea - History of sleep apnea   Review of Systems  Constitutional:  Negative for chills, diaphoresis, fever, malaise/fatigue and weight loss.  HENT:  Negative for congestion, ear discharge, ear pain and hearing loss.   Eyes:  Negative for blurred vision, double vision, photophobia, pain, discharge and redness.  Respiratory:  Negative for cough, sputum production, shortness of breath and wheezing.   Cardiovascular:   Negative for chest pain and palpitations.  Gastrointestinal:  Negative for abdominal pain, blood in stool, constipation,  diarrhea, heartburn, melena, nausea and vomiting.  Genitourinary:  Negative for dysuria, flank pain, frequency, hematuria and urgency.  Musculoskeletal:  Negative for myalgias.  Skin:  Negative for itching and rash.  Neurological:  Negative for dizziness, tingling, tremors, speech change, seizures, loss of consciousness, weakness and headaches.  Psychiatric/Behavioral:  Negative for depression, hallucinations, memory loss, substance abuse and suicidal ideas. The patient does not have insomnia.   All other systems reviewed and are negative.   Past Surgical History:  Procedure Laterality Date   ARTHROSCOPY WITH ANTERIOR CRUCIATE LIGAMENT (ACL) REPAIR WITH ANTERIOR TIBILIAS GRAFT Right    HERNIA REPAIR  2018   Double hernia repair   INGUINAL HERNIA REPAIR Bilateral 09/28/2015   Procedure: LAPAROSCOPIC BILATERAL INGUINAL HERNIA REPAIR;  Surgeon: Camellia Blush, MD;  Location: [facility];  Service: General;  Laterality: Bilateral;   INSERTION OF MESH Bilateral 09/28/2015   Procedure: INSERTION OF MESH;  Surgeon: Camellia Blush, MD;  Location: [facility];  Service: General;  Laterality: Bilateral;   JOINT REPLACEMENT  2019 & 2024   Both knee replacements   SHOULDER ARTHROSCOPY     TOTAL KNEE ARTHROPLASTY Right 01/11/2020   Procedure: TOTAL KNEE ARTHROPLASTY;  Surgeon: Beverley Evalene BIRCH, MD;  Location: [facility];  Service: Orthopedics;  Laterality: Right;    Medications Ordered Prior to Encounter[1]  Family History  Problem Relation Age of Onset   Cancer Mother        colon, lung   Heart disease Father    Hyperlipidemia Father    Heart attack Father     Social History   Socioeconomic History   Marital status: Divorced    Spouse name: Not on file   Number of children: Not on file   Years of education: Not on file   Highest education level: 12th grade  Occupational History    Not on file  Tobacco Use   Smoking status: Never   Smokeless tobacco: Never   Tobacco comments:    Never smoked  Vaping Use   Vaping status: Never Used  Substance and Sexual Activity   Alcohol use: Yes    Comment: OCCASIONAL BEER    Drug use: No   Sexual activity: Not Currently  Other Topics Concern   Not on file  Social History Narrative   Not on file   Social Drivers of Health   Tobacco Use: Low Risk (06/25/2024)   Patient History    Smoking Tobacco Use: Never    Smokeless Tobacco Use: Never    Passive Exposure: Not on file  Financial Resource Strain: Low Risk (09/23/2024)   Overall Financial Resource Strain (CARDIA)    Difficulty of Paying Living Expenses: Not hard at all  Food Insecurity: No Food Insecurity (09/23/2024)   Epic    Worried About Radiation Protection Practitioner of Food in the Last Year: Never true    Ran Out of Food in the Last Year: Never true  Transportation Needs: No Transportation Needs (09/23/2024)   Epic    Lack of Transportation (Medical): No    Lack of Transportation (Non-Medical): No  Physical Activity: Insufficiently Active (09/23/2024)   Exercise Vital Sign    Days of Exercise per Week: 4 days    Minutes of Exercise per Session: 30 min  Stress: No Stress Concern Present (09/23/2024)   [employer] of Occupational Health - Occupational Stress Questionnaire    Feeling of Stress: Only a little  Social Connections: Socially Isolated (09/23/2024)   Social Connection and Isolation Panel  Frequency of Communication with Friends and Family: More than three times a week    Frequency of Social Gatherings with Friends and Family: More than three times a week    Attends Religious Services: Patient declined    Active Member of Clubs or Organizations: No    Attends Engineer, Structural: Not on file    Marital Status: Divorced  Intimate Partner Violence: Not on file  Depression (PHQ2-9): Low Risk (09/24/2024)   Depression (PHQ2-9)    PHQ-2 Score: 0   Alcohol Screen: Low Risk (04/16/2024)   Alcohol Screen    Last Alcohol Screening Score (AUDIT): 1  Housing: Low Risk (09/23/2024)   Epic    Unable to Pay for Housing in the Last Year: No    Number of Times Moved in the Last Year: 0    Homeless in the Last Year: No  Utilities: Not on file  Health Literacy: Not on file                                                                                                  Objective:  Physical Exam: BP 125/80   Pulse 78   Temp 98.1 F (36.7 C)   Ht 5' 10 (1.778 m)   Wt 176 lb (79.8 kg)   SpO2 97%   BMI 25.25 kg/m     Physical Exam Constitutional:      Appearance: Normal appearance.  HENT:     Head: Normocephalic and atraumatic.     Right Ear: Hearing normal.     Left Ear: Hearing normal.     Nose: Nose normal.  Eyes:     General: No scleral icterus.       Right eye: No discharge.        Left eye: No discharge.     Extraocular Movements: Extraocular movements intact.  Cardiovascular:     Rate and Rhythm: Normal rate and regular rhythm.     Heart sounds: Normal heart sounds.  Pulmonary:     Effort: Pulmonary effort is normal.     Breath sounds: Normal breath sounds.  Abdominal:     Palpations: Abdomen is soft.     Tenderness: There is no abdominal tenderness.  Skin:    General: Skin is warm.     Findings: No rash.  Neurological:     General: No focal deficit present.     Mental Status: He is alert.     Cranial Nerves: No cranial nerve deficit.  Psychiatric:        Mood and Affect: Mood normal.        Behavior: Behavior normal.        Thought Content: Thought content normal.        Judgment: Judgment normal.     No results found.  No results found for this or any previous visit (from the past 2160 hours).      Beverley KATHEE Hummer, MD  I,Emily Lagle,acting as a scribe for Beverley KATHEE Hummer, MD.,have documented all relevant documentation on the behalf of Beverley KATHEE Hummer, MD.  LILLETTE Beverley KATHEE Hummer,  MD, have  reviewed all documentation for this visit. The documentation on 09/24/2024 for the exam, diagnosis, procedures, and orders are all accurate and complete.     [1]  Current Outpatient Medications on File Prior to Visit  Medication Sig Dispense Refill   rosuvastatin  (CRESTOR ) 20 MG tablet TAKE 1 TABLET BY MOUTH EVERY DAY FOR CHOLESTEROL 90 tablet 3   valsartan -hydrochlorothiazide  (DIOVAN -HCT) 80-12.5 MG tablet Take 1 tablet by mouth daily. 90 tablet 3   No current facility-administered medications on file prior to visit.

## 2024-10-06 ENCOUNTER — Other Ambulatory Visit: Payer: Self-pay | Admitting: Nurse Practitioner

## 2024-10-06 DIAGNOSIS — E782 Mixed hyperlipidemia: Secondary | ICD-10-CM

## 2024-11-02 ENCOUNTER — Other Ambulatory Visit: Payer: Self-pay

## 2024-11-02 DIAGNOSIS — E782 Mixed hyperlipidemia: Secondary | ICD-10-CM

## 2024-11-02 MED ORDER — ROSUVASTATIN CALCIUM 20 MG PO TABS: ORAL_TABLET | ORAL | 3 refills | Status: AC

## 2024-12-31 ENCOUNTER — Ambulatory Visit: Admitting: Family Medicine
